# Patient Record
Sex: Female | Born: 1985 | Race: White | Hispanic: No | Smoking: Current every day smoker
Health system: Southern US, Community
[De-identification: ages and names within clinical notes are randomized; demographics above are authoritative.]

## PROBLEM LIST (undated history)

## (undated) ENCOUNTER — Inpatient Hospital Stay: Payer: Self-pay

## (undated) DIAGNOSIS — F419 Anxiety disorder, unspecified: Secondary | ICD-10-CM

## (undated) DIAGNOSIS — R109 Unspecified abdominal pain: Secondary | ICD-10-CM

## (undated) DIAGNOSIS — R3911 Hesitancy of micturition: Secondary | ICD-10-CM

## (undated) DIAGNOSIS — N200 Calculus of kidney: Secondary | ICD-10-CM

## (undated) DIAGNOSIS — N390 Urinary tract infection, site not specified: Secondary | ICD-10-CM

## (undated) DIAGNOSIS — M549 Dorsalgia, unspecified: Secondary | ICD-10-CM

## (undated) DIAGNOSIS — F410 Panic disorder [episodic paroxysmal anxiety] without agoraphobia: Secondary | ICD-10-CM

## (undated) DIAGNOSIS — K219 Gastro-esophageal reflux disease without esophagitis: Secondary | ICD-10-CM

## (undated) DIAGNOSIS — F319 Bipolar disorder, unspecified: Secondary | ICD-10-CM

## (undated) DIAGNOSIS — F4310 Post-traumatic stress disorder, unspecified: Secondary | ICD-10-CM

## (undated) DIAGNOSIS — J45909 Unspecified asthma, uncomplicated: Secondary | ICD-10-CM

## (undated) DIAGNOSIS — R310 Gross hematuria: Secondary | ICD-10-CM

## (undated) HISTORY — DX: Urinary tract infection, site not specified: N39.0

## (undated) HISTORY — DX: Gross hematuria: R31.0

## (undated) HISTORY — DX: Dorsalgia, unspecified: M54.9

## (undated) HISTORY — PX: LAPAROSCOPIC OVARIAN CYSTECTOMY: SUR786

## (undated) HISTORY — DX: Unspecified asthma, uncomplicated: J45.909

## (undated) HISTORY — DX: Gastro-esophageal reflux disease without esophagitis: K21.9

## (undated) HISTORY — PX: DILATION AND CURETTAGE OF UTERUS: SHX78

## (undated) HISTORY — DX: Hesitancy of micturition: R39.11

## (undated) HISTORY — DX: Bipolar disorder, unspecified: F31.9

## (undated) HISTORY — DX: Unspecified abdominal pain: R10.9

## (undated) HISTORY — DX: Anxiety disorder, unspecified: F41.9

## (undated) HISTORY — DX: Post-traumatic stress disorder, unspecified: F43.10

---

## 2005-01-17 ENCOUNTER — Emergency Department: Payer: Self-pay | Admitting: Emergency Medicine

## 2005-01-17 ENCOUNTER — Other Ambulatory Visit: Payer: Self-pay

## 2005-01-19 ENCOUNTER — Emergency Department: Payer: Self-pay | Admitting: Emergency Medicine

## 2005-01-20 ENCOUNTER — Emergency Department: Payer: Self-pay | Admitting: Emergency Medicine

## 2005-01-29 ENCOUNTER — Emergency Department: Payer: Self-pay | Admitting: General Practice

## 2005-07-07 ENCOUNTER — Observation Stay: Payer: Self-pay

## 2005-11-22 ENCOUNTER — Emergency Department: Payer: Self-pay | Admitting: Emergency Medicine

## 2005-12-11 ENCOUNTER — Emergency Department: Payer: Self-pay | Admitting: Emergency Medicine

## 2005-12-12 ENCOUNTER — Emergency Department: Payer: Self-pay | Admitting: Internal Medicine

## 2006-05-19 ENCOUNTER — Emergency Department: Payer: Self-pay | Admitting: Emergency Medicine

## 2006-05-19 ENCOUNTER — Other Ambulatory Visit: Payer: Self-pay

## 2006-06-22 ENCOUNTER — Emergency Department: Payer: Self-pay | Admitting: Emergency Medicine

## 2006-10-25 ENCOUNTER — Emergency Department: Payer: Self-pay | Admitting: Emergency Medicine

## 2006-10-26 ENCOUNTER — Emergency Department: Payer: Self-pay | Admitting: Unknown Physician Specialty

## 2007-01-03 ENCOUNTER — Emergency Department: Payer: Self-pay | Admitting: Emergency Medicine

## 2007-02-05 ENCOUNTER — Emergency Department: Payer: Self-pay | Admitting: Emergency Medicine

## 2007-02-15 ENCOUNTER — Emergency Department: Payer: Self-pay | Admitting: Internal Medicine

## 2007-04-14 ENCOUNTER — Emergency Department: Payer: Self-pay | Admitting: Emergency Medicine

## 2007-04-21 ENCOUNTER — Emergency Department: Payer: Self-pay | Admitting: Emergency Medicine

## 2007-05-16 ENCOUNTER — Other Ambulatory Visit: Payer: Self-pay

## 2007-05-16 ENCOUNTER — Emergency Department: Payer: Self-pay | Admitting: Emergency Medicine

## 2007-06-12 ENCOUNTER — Emergency Department: Payer: Self-pay | Admitting: Emergency Medicine

## 2007-06-20 ENCOUNTER — Emergency Department: Payer: Self-pay | Admitting: Emergency Medicine

## 2007-07-07 ENCOUNTER — Emergency Department: Payer: Self-pay | Admitting: Emergency Medicine

## 2007-08-06 ENCOUNTER — Emergency Department: Payer: Self-pay | Admitting: Emergency Medicine

## 2007-08-21 ENCOUNTER — Emergency Department: Payer: Self-pay | Admitting: Internal Medicine

## 2007-12-03 ENCOUNTER — Emergency Department: Payer: Self-pay | Admitting: Emergency Medicine

## 2007-12-17 ENCOUNTER — Emergency Department: Payer: Self-pay | Admitting: Emergency Medicine

## 2008-05-20 ENCOUNTER — Emergency Department: Payer: Self-pay | Admitting: Emergency Medicine

## 2008-10-07 ENCOUNTER — Emergency Department: Payer: Self-pay | Admitting: Emergency Medicine

## 2008-10-11 ENCOUNTER — Emergency Department: Payer: Self-pay | Admitting: Emergency Medicine

## 2008-12-19 ENCOUNTER — Ambulatory Visit: Payer: Self-pay | Admitting: Family Medicine

## 2009-02-23 ENCOUNTER — Emergency Department: Payer: Self-pay | Admitting: Emergency Medicine

## 2009-05-14 ENCOUNTER — Emergency Department: Payer: Self-pay | Admitting: Emergency Medicine

## 2009-09-14 ENCOUNTER — Emergency Department: Payer: Self-pay | Admitting: Emergency Medicine

## 2009-09-25 ENCOUNTER — Emergency Department: Payer: Self-pay | Admitting: Emergency Medicine

## 2009-10-28 ENCOUNTER — Emergency Department: Payer: Self-pay | Admitting: Internal Medicine

## 2009-12-14 ENCOUNTER — Emergency Department: Payer: Self-pay | Admitting: Emergency Medicine

## 2010-01-01 ENCOUNTER — Emergency Department: Payer: Self-pay | Admitting: Emergency Medicine

## 2010-01-31 ENCOUNTER — Emergency Department: Payer: Self-pay | Admitting: Internal Medicine

## 2010-04-25 ENCOUNTER — Emergency Department: Payer: Self-pay | Admitting: Emergency Medicine

## 2010-07-14 ENCOUNTER — Emergency Department: Payer: Self-pay | Admitting: Emergency Medicine

## 2010-09-19 ENCOUNTER — Emergency Department: Payer: Self-pay | Admitting: Emergency Medicine

## 2010-12-17 ENCOUNTER — Emergency Department: Payer: Self-pay | Admitting: Emergency Medicine

## 2011-04-04 ENCOUNTER — Emergency Department: Payer: Self-pay | Admitting: Emergency Medicine

## 2011-04-06 ENCOUNTER — Emergency Department: Payer: Self-pay | Admitting: Unknown Physician Specialty

## 2011-07-22 ENCOUNTER — Emergency Department: Payer: Self-pay | Admitting: Emergency Medicine

## 2011-11-20 ENCOUNTER — Emergency Department: Payer: Self-pay | Admitting: Emergency Medicine

## 2011-11-20 LAB — URINALYSIS, COMPLETE
Bilirubin,UR: NEGATIVE
Glucose,UR: NEGATIVE mg/dL (ref 0–75)
Hyaline Cast: 3
Ketone: NEGATIVE
Leukocyte Esterase: NEGATIVE
Nitrite: NEGATIVE
Ph: 5 (ref 4.5–8.0)
Protein: 30
RBC,UR: 3 /HPF (ref 0–5)
WBC UR: 5 /HPF (ref 0–5)

## 2011-12-17 ENCOUNTER — Emergency Department: Payer: Self-pay | Admitting: Emergency Medicine

## 2011-12-17 LAB — URINALYSIS, COMPLETE
Bilirubin,UR: NEGATIVE
Ketone: NEGATIVE
Leukocyte Esterase: NEGATIVE
Nitrite: NEGATIVE
Ph: 5 (ref 4.5–8.0)
Specific Gravity: 1.012 (ref 1.003–1.030)
Squamous Epithelial: 9
WBC UR: 1 /HPF (ref 0–5)

## 2011-12-19 LAB — URINE CULTURE

## 2012-01-21 ENCOUNTER — Emergency Department: Payer: Self-pay | Admitting: Emergency Medicine

## 2012-01-21 LAB — PREGNANCY, URINE: Pregnancy Test, Urine: NEGATIVE m[IU]/mL

## 2012-01-21 LAB — URINALYSIS, COMPLETE
Bacteria: NONE SEEN
Bilirubin,UR: NEGATIVE
Glucose,UR: NEGATIVE mg/dL (ref 0–75)
Leukocyte Esterase: NEGATIVE
Nitrite: NEGATIVE
Ph: 5 (ref 4.5–8.0)
Protein: 100
RBC,UR: 3114 /HPF (ref 0–5)
Squamous Epithelial: 57
WBC UR: NONE SEEN /HPF (ref 0–5)

## 2012-01-22 LAB — URINE CULTURE

## 2012-02-09 ENCOUNTER — Emergency Department: Payer: Self-pay | Admitting: Emergency Medicine

## 2012-02-09 LAB — CBC
HCT: 43.6 % (ref 35.0–47.0)
HGB: 14.2 g/dL (ref 12.0–16.0)
MCH: 30.4 pg (ref 26.0–34.0)
MCV: 93 fL (ref 80–100)
RDW: 13.4 % (ref 11.5–14.5)

## 2012-02-09 LAB — BASIC METABOLIC PANEL
Calcium, Total: 8.7 mg/dL (ref 8.5–10.1)
Chloride: 109 mmol/L — ABNORMAL HIGH (ref 98–107)
Creatinine: 0.79 mg/dL (ref 0.60–1.30)
EGFR (African American): >60
Glucose: 93 mg/dL (ref 65–99)
Sodium: 142 mmol/L (ref 136–145)

## 2012-02-09 LAB — PREGNANCY, URINE: Pregnancy Test, Urine: NEGATIVE m[IU]/mL

## 2012-02-09 LAB — WET PREP, GENITAL

## 2012-02-15 ENCOUNTER — Emergency Department: Payer: Self-pay | Admitting: Emergency Medicine

## 2012-02-15 LAB — URINALYSIS, COMPLETE
Bilirubin,UR: NEGATIVE
Blood: NEGATIVE
Glucose,UR: 50 mg/dL (ref 0–75)
Ketone: NEGATIVE
Leukocyte Esterase: NEGATIVE
Nitrite: POSITIVE
Ph: 5 (ref 4.5–8.0)
Protein: NEGATIVE
RBC,UR: 1 /HPF (ref 0–5)
Specific Gravity: 1.015 (ref 1.003–1.030)
Squamous Epithelial: 5
WBC UR: 2 /HPF (ref 0–5)

## 2012-04-18 ENCOUNTER — Emergency Department: Payer: Self-pay | Admitting: Emergency Medicine

## 2012-04-18 LAB — URINALYSIS, COMPLETE
Bacteria: NONE SEEN
Bilirubin,UR: NEGATIVE
Blood: NEGATIVE
Glucose,UR: NEGATIVE mg/dL (ref 0–75)
Leukocyte Esterase: NEGATIVE
Nitrite: NEGATIVE
Ph: 7 (ref 4.5–8.0)
Protein: NEGATIVE
RBC,UR: 1 /HPF (ref 0–5)

## 2012-04-18 LAB — DRUG SCREEN, URINE
Amphetamines, Ur Screen: NEGATIVE (ref ?–1000)
Barbiturates, Ur Screen: NEGATIVE (ref ?–200)
Benzodiazepine, Ur Scrn: POSITIVE (ref ?–200)
Cannabinoid 50 Ng, Ur ~~LOC~~: POSITIVE (ref ?–50)
Methadone, Ur Screen: NEGATIVE (ref ?–300)
Tricyclic, Ur Screen: NEGATIVE (ref ?–1000)

## 2012-04-18 LAB — CBC
HGB: 14.3 g/dL (ref 12.0–16.0)
MCH: 32 pg (ref 26.0–34.0)
MCHC: 33.9 g/dL (ref 32.0–36.0)
Platelet: 198 10*3/uL (ref 150–440)
RDW: 13.5 % (ref 11.5–14.5)

## 2012-04-18 LAB — BASIC METABOLIC PANEL
Anion Gap: 4 — ABNORMAL LOW (ref 7–16)
BUN: 8 mg/dL (ref 7–18)
Co2: 28 mmol/L (ref 21–32)
Creatinine: 0.72 mg/dL (ref 0.60–1.30)
EGFR (African American): >60
EGFR (Non-African Amer.): >60
Glucose: 93 mg/dL (ref 65–99)

## 2012-04-19 LAB — PREGNANCY, URINE: Pregnancy Test, Urine: NEGATIVE m[IU]/mL

## 2012-04-25 ENCOUNTER — Emergency Department: Payer: Self-pay | Admitting: Emergency Medicine

## 2012-04-25 LAB — PREGNANCY, URINE: Pregnancy Test, Urine: NEGATIVE m[IU]/mL

## 2012-04-25 LAB — URINALYSIS, COMPLETE
Bilirubin,UR: NEGATIVE
Glucose,UR: NEGATIVE mg/dL (ref 0–75)
Ketone: NEGATIVE
Leukocyte Esterase: NEGATIVE
Nitrite: POSITIVE
Ph: 5 (ref 4.5–8.0)
Protein: NEGATIVE
RBC,UR: 48 /HPF (ref 0–5)
Specific Gravity: 1.013 (ref 1.003–1.030)
Squamous Epithelial: 3
WBC UR: 2 /HPF (ref 0–5)

## 2012-05-31 ENCOUNTER — Emergency Department: Payer: Self-pay | Admitting: Emergency Medicine

## 2012-05-31 LAB — URINALYSIS, COMPLETE
Bacteria: NONE SEEN
Bilirubin,UR: NEGATIVE
Ketone: NEGATIVE
Protein: NEGATIVE
RBC,UR: 165 /HPF (ref 0–5)
Specific Gravity: 1.014 (ref 1.003–1.030)
Squamous Epithelial: 3
WBC UR: 2 /HPF (ref 0–5)

## 2012-06-01 LAB — WET PREP, GENITAL

## 2012-09-25 ENCOUNTER — Emergency Department: Payer: Self-pay | Admitting: Emergency Medicine

## 2012-09-25 LAB — URINALYSIS, COMPLETE
Bilirubin,UR: NEGATIVE
Ketone: NEGATIVE
Nitrite: NEGATIVE
Ph: 8 (ref 4.5–8.0)
Protein: NEGATIVE
RBC,UR: 10 /HPF (ref 0–5)
Specific Gravity: 1.01 (ref 1.003–1.030)
WBC UR: 3 /HPF (ref 0–5)

## 2012-09-25 LAB — WET PREP, GENITAL

## 2012-09-27 LAB — URINE CULTURE

## 2012-10-24 ENCOUNTER — Emergency Department: Payer: Self-pay | Admitting: Emergency Medicine

## 2012-10-24 LAB — URINALYSIS, COMPLETE
Bacteria: NONE SEEN
Bilirubin,UR: NEGATIVE
Glucose,UR: NEGATIVE mg/dL (ref 0–75)
Ketone: NEGATIVE
Leukocyte Esterase: NEGATIVE
Ph: 6 (ref 4.5–8.0)
Protein: 30
RBC,UR: 4 /HPF (ref 0–5)
WBC UR: 2 /HPF (ref 0–5)

## 2012-10-24 LAB — CBC
HCT: 40.3 % (ref 35.0–47.0)
HGB: 13.5 g/dL (ref 12.0–16.0)
MCHC: 33.5 g/dL (ref 32.0–36.0)
Platelet: 202 10*3/uL (ref 150–440)
RBC: 4.41 10*6/uL (ref 3.80–5.20)
RDW: 13.6 % (ref 11.5–14.5)

## 2012-10-25 LAB — WET PREP, GENITAL

## 2012-12-01 ENCOUNTER — Ambulatory Visit: Payer: Self-pay | Admitting: Obstetrics and Gynecology

## 2012-12-01 LAB — CBC
HGB: 13.4 g/dL (ref 12.0–16.0)
MCH: 31 pg (ref 26.0–34.0)
MCHC: 34 g/dL (ref 32.0–36.0)
MCV: 91 fL (ref 80–100)
Platelet: 255 10*3/uL (ref 150–440)
RBC: 4.32 10*6/uL (ref 3.80–5.20)

## 2012-12-02 ENCOUNTER — Ambulatory Visit: Payer: Self-pay | Admitting: Obstetrics and Gynecology

## 2012-12-05 LAB — PATHOLOGY REPORT

## 2013-04-12 ENCOUNTER — Ambulatory Visit: Payer: Self-pay | Admitting: Urology

## 2013-04-13 ENCOUNTER — Ambulatory Visit: Payer: Self-pay | Admitting: Urology

## 2013-04-19 ENCOUNTER — Ambulatory Visit: Payer: Self-pay | Admitting: Urology

## 2013-06-11 ENCOUNTER — Emergency Department: Payer: Self-pay | Admitting: Emergency Medicine

## 2013-06-11 LAB — URINALYSIS, COMPLETE
Bacteria: NONE SEEN
Bilirubin,UR: NEGATIVE
Glucose,UR: NEGATIVE mg/dL (ref 0–75)
Ketone: NEGATIVE
Protein: NEGATIVE
RBC,UR: 3 /HPF (ref 0–5)
WBC UR: 1 /HPF (ref 0–5)

## 2013-06-11 LAB — COMPREHENSIVE METABOLIC PANEL
Albumin: 3.8 g/dL (ref 3.4–5.0)
Anion Gap: 3 — ABNORMAL LOW (ref 7–16)
Bilirubin,Total: 0.4 mg/dL (ref 0.2–1.0)
Calcium, Total: 8.6 mg/dL (ref 8.5–10.1)
Co2: 25 mmol/L (ref 21–32)
Creatinine: 0.79 mg/dL (ref 0.60–1.30)
EGFR (African American): >60
Glucose: 89 mg/dL (ref 65–99)
Osmolality: 276 (ref 275–301)
Potassium: 4 mmol/L (ref 3.5–5.1)
SGOT(AST): 22 U/L (ref 15–37)
SGPT (ALT): 16 U/L (ref 12–78)
Sodium: 138 mmol/L (ref 136–145)
Total Protein: 6.8 g/dL (ref 6.4–8.2)

## 2013-06-11 LAB — CBC
HCT: 42.2 % (ref 35.0–47.0)
HGB: 14.5 g/dL (ref 12.0–16.0)
MCHC: 34.4 g/dL (ref 32.0–36.0)
MCV: 91 fL (ref 80–100)
Platelet: 158 10*3/uL (ref 150–440)
RBC: 4.64 10*6/uL (ref 3.80–5.20)
WBC: 6.3 10*3/uL (ref 3.6–11.0)

## 2013-06-11 LAB — LIPASE, BLOOD: Lipase: 82 U/L (ref 73–393)

## 2013-06-11 LAB — WET PREP, GENITAL

## 2013-07-29 ENCOUNTER — Emergency Department: Payer: Self-pay | Admitting: Emergency Medicine

## 2013-07-29 LAB — URINALYSIS, COMPLETE
Bacteria: NONE SEEN
Bilirubin,UR: NEGATIVE
Blood: NEGATIVE
Leukocyte Esterase: NEGATIVE
Nitrite: NEGATIVE
Protein: NEGATIVE
RBC,UR: <1 /HPF (ref 0–5)
Specific Gravity: 1.003 (ref 1.003–1.030)
Squamous Epithelial: 3
WBC UR: 2 /HPF (ref 0–5)

## 2013-07-29 LAB — CBC
HCT: 38.6 % (ref 35.0–47.0)
HGB: 12.8 g/dL (ref 12.0–16.0)
MCH: 29.7 pg (ref 26.0–34.0)
MCHC: 33.1 g/dL (ref 32.0–36.0)
MCV: 90 fL (ref 80–100)
Platelet: 193 10*3/uL (ref 150–440)
RDW: 13.4 % (ref 11.5–14.5)
WBC: 7.5 10*3/uL (ref 3.6–11.0)

## 2013-07-29 LAB — BASIC METABOLIC PANEL
BUN: 9 mg/dL (ref 7–18)
Calcium, Total: 8.7 mg/dL (ref 8.5–10.1)
Chloride: 108 mmol/L — ABNORMAL HIGH (ref 98–107)
Co2: 28 mmol/L (ref 21–32)
EGFR (African American): >60
Glucose: 105 mg/dL — ABNORMAL HIGH (ref 65–99)
Osmolality: 282 (ref 275–301)

## 2013-07-29 LAB — PREGNANCY, URINE: Pregnancy Test, Urine: NEGATIVE m[IU]/mL

## 2014-07-09 ENCOUNTER — Emergency Department: Payer: Self-pay | Admitting: Emergency Medicine

## 2014-12-14 NOTE — Op Note (Signed)
PATIENT NAME:  Jennifer Richmond, Jennifer Richmond MR#:  [id number] DATE OF BIRTH:  1986-07-06  DATE OF PROCEDURE:  12/02/2012  PREOPERATIVE DIAGNOSES: 1.  Incomplete abortion.  2.  A positive blood type.   POSTOPERATIVE DIAGNOSES:   1.  Incomplete abortion.  2.  A positive blood type.   OPERATIVE PROCEDURE:  Suction D and C.   SURGEON:  Christin Mccreedy A. Katelen Luepke, M.D.   FIRST ASSISTANT:  Sunday Corn, PA-S.   ANESTHESIA:  General LMA.   INDICATIONS:  The patient is a 29 year old white female, gravida 4, para 1-0-2-1, in first trimester who presented for management of incomplete abortion. She is status post Cytotec x 2 without complete evacuation of the uterus. The patient is still having bleeding and cramping and desires surgical evacuation.   FINDINGS AT SURGERY:  Revealed a gynecoid pelvis; the uterus was anteverted and top of normal size. Uterine descensus was notable with the cervix coming to within 2 cm of the introitus. The uterus sounded to 8 cm.  Average amount of POC was obtained.   DESCRIPTION OF PROCEDURE:  The patient was brought to the operating room where she was placed in the supine position. General anesthesia with LMA was induced without difficulty. She was placed in dorsal lithotomy position using the candy cane stirrups. A Betadine perineal  intravaginal prep and drape was performed in the standard fashion. A red Robinson catheter was used to drain 25 mL of urine from the bladder. A weighted speculum was placed to the vagina  and a single-tooth tenaculum on the anterior lip of the cervix. The uterus was sounded to 8 cm.  Hanks dilators were used up to a #20 [place] caliber to dilate the endocervical canal. A #10 suction curette was then used with suction curettage to remove the products of conception. A serrated curette was used to verify no significant residual tissue was left behind. Upon completion of the procedure, all instrumentation was removed from the vagina. The patient was then awakened,  mobilized and taken to the recovery room in satisfactory condition.  Estimated blood loss was minimal. Complications were none.  All instruments, needle and sponge counts were verified as correct.     ____________________________ Alanda Slim. Luay Balding, MD mad:dmm D: 12/02/2012 08:51:00 ET T: 12/02/2012 11:09:55 ET JOB#: [id number]  cc: Hassell Done A. Sireen Halk, MD, <Dictator> [facility] MD ELECTRONICALLY SIGNED 12/05/2012 16:36

## 2015-03-06 ENCOUNTER — Emergency Department
Admission: EM | Admit: 2015-03-06 | Discharge: 2015-03-06 | Disposition: A | Discharge status: Home / Self Care | Payer: Medicaid Other | Attending: Emergency Medicine | Admitting: Emergency Medicine

## 2015-03-06 ENCOUNTER — Emergency Department: Payer: Medicaid Other

## 2015-03-06 ENCOUNTER — Encounter: Payer: Self-pay | Admitting: Emergency Medicine

## 2015-03-06 DIAGNOSIS — W228XXA Striking against or struck by other objects, initial encounter: Secondary | ICD-10-CM | POA: Diagnosis not present

## 2015-03-06 DIAGNOSIS — S9031XA Contusion of right foot, initial encounter: Secondary | ICD-10-CM | POA: Insufficient documentation

## 2015-03-06 DIAGNOSIS — Z720 Tobacco use: Secondary | ICD-10-CM | POA: Diagnosis not present

## 2015-03-06 DIAGNOSIS — Y998 Other external cause status: Secondary | ICD-10-CM | POA: Diagnosis not present

## 2015-03-06 DIAGNOSIS — Y9289 Other specified places as the place of occurrence of the external cause: Secondary | ICD-10-CM | POA: Insufficient documentation

## 2015-03-06 DIAGNOSIS — Y9389 Activity, other specified: Secondary | ICD-10-CM | POA: Diagnosis not present

## 2015-03-06 DIAGNOSIS — Z79899 Other long term (current) drug therapy: Secondary | ICD-10-CM | POA: Insufficient documentation

## 2015-03-06 DIAGNOSIS — S9032XA Contusion of left foot, initial encounter: Secondary | ICD-10-CM | POA: Insufficient documentation

## 2015-03-06 DIAGNOSIS — S99921A Unspecified injury of right foot, initial encounter: Secondary | ICD-10-CM | POA: Diagnosis present

## 2015-03-06 DIAGNOSIS — S99929A Unspecified injury of unspecified foot, initial encounter: Secondary | ICD-10-CM

## 2015-03-06 HISTORY — DX: Calculus of kidney: N20.0

## 2015-03-06 HISTORY — DX: Panic disorder (episodic paroxysmal anxiety): F41.0

## 2015-03-06 MED ORDER — KETOROLAC TROMETHAMINE 60 MG/2ML IM SOLN
60.0000 mg | Freq: Once | INTRAMUSCULAR | Status: AC
  Administered 2015-03-06: 60 mg via INTRAMUSCULAR
  Filled 2015-03-06: qty 2

## 2015-03-06 NOTE — ED Notes (Signed)
Pt to ED c/o bilateral foot injury.  Pt states tripped over toolbox tonight going to restroom.  Abrasions noted to medial side left foot.  Pt also c/o pain to medial side right foot from fall previously down stairs.  Skin warm and dry, pedal pulses strong and equal bilaterally, no obvious deformity noted, pt with limited movement to toes.  Pt states took IBU when injury happened and half of a tramadol an hour later.

## 2015-03-06 NOTE — ED Provider Notes (Signed)
[facility] Emergency Department Provider Note  ____________________________________________  Time seen: 3:00  I have reviewed the triage vital signs and the nursing notes.   HISTORY  Chief Complaint Foot Pain      HPI Jennifer Richmond is a 29 y.o. female presents with history of striking her left foot against a toolbox this evening. Patient admits to 7 out of 10 medial left foot pain. In addition patient admits to injuring her right foot approximately 3 weeks early in the same area.     Past Medical History  Diagnosis Date  . Kidney stones   . Panic attacks     There are no active problems to display for this patient.   History reviewed. No pertinent past surgical history.  Current Outpatient Rx  Name  Route  Sig  Dispense  Refill  . clonazePAM (KLONOPIN) 1 MG tablet   Oral   Take 1 mg by mouth 2 (two) times daily.         . tamsulosin (FLOMAX) 0.4 MG CAPS capsule   Oral   Take 0.4 mg by mouth.         . traMADol (ULTRAM) 50 MG tablet   Oral   Take by mouth every 6 (six) hours as needed.           Allergies Review of patient's allergies indicates no known allergies.  History reviewed. No pertinent family history.  Social History History  Substance Use Topics  . Smoking status: Current Every Day Smoker  . Smokeless tobacco: Not on file  . Alcohol Use: Yes    Review of Systems  Constitutional: Negative for fever. Eyes: Negative for visual changes. ENT: Negative for sore throat. Cardiovascular: Negative for chest pain. Respiratory: Negative for shortness of breath. Gastrointestinal: Negative for abdominal pain, vomiting and diarrhea. Genitourinary: Negative for dysuria. Musculoskeletal: Bilateral feet pain Skin: Negative for rash. Neurological: Negative for headaches, focal weakness or numbness.   10-point ROS otherwise negative.  ____________________________________________   PHYSICAL EXAM:  VITAL SIGNS: ED  Triage Vitals  Enc Vitals Group     BP 03/06/15 0310 101/58 mmHg     Pulse Rate 03/06/15 0310 71     Resp 03/06/15 0310 20     Temp 03/06/15 0310 98.4 F (36.9 C)     Temp src --      SpO2 03/06/15 0310 100 %     Weight 03/06/15 0307 100 lb (45.36 kg)     Height 03/06/15 0307 5' (1.524 m)     Head Cir --      Peak Flow --      Pain Score 03/06/15 0307 8     Pain Loc --      Pain Edu? --      Excl. in [place]? --      Constitutional: Alert and oriented. Well appearing and in no distress. Eyes: Conjunctivae are normal. PERRL. Normal extraocular movements. ENT   Head: Normocephalic and atraumatic.   Nose: No congestion/rhinnorhea.   Mouth/Throat: Mucous membranes are moist.   Neck: No stridor.  Cardiovascular: Normal rate, regular rhythm. Normal and symmetric distal pulses are present in all extremities. No murmurs, rubs, or gallops. Respiratory: Normal respiratory effort without tachypnea nor retractions. Breath sounds are clear and equal bilaterally. No wheezes/rales/rhonchi. Gastrointestinal: Soft and nontender. No distention. There is no CVA tenderness. Genitourinary: deferred Musculoskeletal: Nontender with normal range of motion in all extremities. Pain with palpation of the medial aspect of the left foot. Pain  to palpation at the right1st PIP joint of the right foot Neurologic:  Normal speech and language. No gross focal neurologic deficits are appreciated. Speech is normal.  Skin:  2 small abrasions noted to the medial aspect of the left foot  ____________________________________________    RADIOLOGY  Right foot x-ray revealed: Negative for fracture Left foot x-ray revealed: Negative for fracture  ____________________________________________     INITIAL IMPRESSION / ASSESSMENT AND PLAN / ED COURSE  Pertinent labs & imaging results that were available during my care of the patient were reviewed by me and considered in my medical decision making (see chart  for details).   ____________________________________________   FINAL CLINICAL IMPRESSION(S) / ED DIAGNOSES  Final diagnoses:  Foot contusion, left, initial encounter  Foot contusion, right, initial encounter      Gregor Hams, MD 03/08/15 [phone]

## 2015-03-06 NOTE — Discharge Instructions (Signed)
Contusion °A contusion is a deep bruise. Contusions are the result of an injury that caused bleeding under the skin. The contusion may turn blue, purple, or yellow. Minor injuries will give you a painless contusion, but more severe contusions may stay painful and swollen for a few weeks.  °CAUSES  °A contusion is usually caused by a blow, trauma, or direct force to an area of the body. °SYMPTOMS  °· Swelling and redness of the injured area. °· Bruising of the injured area. °· Tenderness and soreness of the injured area. °· Pain. °DIAGNOSIS  °The diagnosis can be made by taking a history and physical exam. An X-ray, CT scan, or MRI may be needed to determine if there were any associated injuries, such as fractures. °TREATMENT  °Specific treatment will depend on what area of the body was injured. In general, the best treatment for a contusion is resting, icing, elevating, and applying cold compresses to the injured area. Over-the-counter medicines may also be recommended for pain control. Ask your caregiver what the best treatment is for your contusion. °HOME CARE INSTRUCTIONS  °· Put ice on the injured area. °¨ Put ice in a plastic bag. °¨ Place a towel between your skin and the bag. °¨ Leave the ice on for 15-20 minutes, 3-4 times a day, or as directed by your health care provider. °· Only take over-the-counter or prescription medicines for pain, discomfort, or fever as directed by your caregiver. Your caregiver may recommend avoiding anti-inflammatory medicines (aspirin, ibuprofen, and naproxen) for 48 hours because these medicines may increase bruising. °· Rest the injured area. °· If possible, elevate the injured area to reduce swelling. °SEEK IMMEDIATE MEDICAL CARE IF:  °· You have increased bruising or swelling. °· You have pain that is getting worse. °· Your swelling or pain is not relieved with medicines. °MAKE SURE YOU:  °· Understand these instructions. °· Will watch your condition. °· Will get help right  away if you are not doing well or get worse. °Document Released: 05/20/2005 Document Revised: 08/15/2013 Document Reviewed: 06/15/2011 °ExitCare® Patient Information ©2015 [employer]. This information is not intended to replace advice given to you by your health care provider. Make sure you discuss any questions you have with your health care provider. ° °

## 2015-03-06 NOTE — ED Notes (Addendum)
Pt presents to ER alert and in NAD. Pt states right foot pain x 1 hour, reports injury 3 weeks ago. Pt also states pain in left foot after tripping today. Small abrasion noted, no swelling or bruising.

## 2015-03-08 ENCOUNTER — Ambulatory Visit: Payer: Self-pay | Admitting: Urology

## 2015-04-12 ENCOUNTER — Telehealth: Payer: Self-pay

## 2015-04-12 NOTE — Telephone Encounter (Signed)
Pharmacy is requesting a refill of macrobid. Pt last saw Dr. Elnoria Howard 02/2014. Dr. Elnoria Howard put on daily macrobid x90 with 3 refills. Please advise.

## 2015-04-14 NOTE — Telephone Encounter (Signed)
Patient will need an office visit prior to refilling her Macrobid.

## 2015-04-16 NOTE — Telephone Encounter (Signed)
No answer

## 2015-04-17 NOTE — Telephone Encounter (Signed)
No answer

## 2015-04-18 NOTE — Telephone Encounter (Signed)
Will send a letter

## 2015-05-03 ENCOUNTER — Encounter: Payer: Self-pay | Admitting: Obstetrics and Gynecology

## 2015-05-03 ENCOUNTER — Telehealth: Payer: Self-pay | Admitting: *Deleted

## 2015-05-03 ENCOUNTER — Ambulatory Visit (INDEPENDENT_AMBULATORY_CARE_PROVIDER_SITE_OTHER): Payer: Medicaid Other | Admitting: Obstetrics and Gynecology

## 2015-05-03 ENCOUNTER — Other Ambulatory Visit: Payer: Self-pay | Admitting: Obstetrics and Gynecology

## 2015-05-03 VITALS — BP 90/56 | HR 55 | Resp 14 | Ht 65.0 in | Wt 97.1 lb

## 2015-05-03 DIAGNOSIS — R109 Unspecified abdominal pain: Secondary | ICD-10-CM

## 2015-05-03 DIAGNOSIS — N390 Urinary tract infection, site not specified: Secondary | ICD-10-CM

## 2015-05-03 LAB — URINALYSIS, COMPLETE
BILIRUBIN UA: POSITIVE — AB
Nitrite, UA: POSITIVE — AB
Specific Gravity, UA: 1.02 (ref 1.005–1.030)
UUROB: 2 mg/dL — AB (ref 0.2–1.0)
pH, UA: 5 (ref 5.0–7.5)

## 2015-05-03 LAB — MICROSCOPIC EXAMINATION

## 2015-05-03 MED ORDER — NITROFURANTOIN MONOHYD MACRO 100 MG PO CAPS: 100.0000 mg | ORAL_CAPSULE | Freq: Every day | ORAL | Status: DC

## 2015-05-03 MED ORDER — CIPROFLOXACIN HCL 500 MG PO TABS: 500.0000 mg | ORAL_TABLET | Freq: Two times a day (BID) | ORAL | Status: DC

## 2015-05-03 MED ORDER — TAMSULOSIN HCL 0.4 MG PO CAPS: 0.4000 mg | ORAL_CAPSULE | Freq: Every day | ORAL | Status: DC

## 2015-05-03 NOTE — Telephone Encounter (Signed)
Jennifer Richmond spoke with pt and gave culture information.

## 2015-05-03 NOTE — Addendum Note (Signed)
Addended by: Herbert Moors C on: 05/03/2015 02:00 PM   Modules accepted: Orders

## 2015-05-03 NOTE — Telephone Encounter (Signed)
LMOM Please let patient know that Ria Comment received her UA from today's visit and will be ordering a culture. That it may be resistant to the Macrobid she is already on and Ria Comment will be calling a new rx into pharmacy. Patient will receive a call when culture comes back.

## 2015-05-03 NOTE — Progress Notes (Addendum)
05/03/2015 12:11 PM   Jennifer Richmond 01/14/1986 [id number]  Referring provider: Threasa Alpha, FNP  Chief Complaint  Patient presents with  . Vaginal Itching    refill Macrobid    HPI: Patient is a 29 year old Caucasian with a history of kidney stones, recurrent urinary tract infections, and recurrent small yeast infections. She has done well on suppressive antibiotics taking Macrobid daily. Approximately 1 year ago she was noted to have multiple right sided nonobstructing kidney stones. She was told at that time that she would most likely be able to pass the stones without intervention and has been taking daily Flomax since. She also reports that she did experience difficulty urinating and the Flomax helps with this well. She has previously underwent cystoscopy and was told at that time her bladder was normal but that she had inflammation of her urethra. She is currently being treated by her PCP for bacterial vaginosis with Flagyl. She states that medication has made her extremely nauseated. She has also recently experiencing some right-sided flank pain. She is also taking probiotics and has used boric acid suppositories in the past to help with her recurrent vaginal yeast infections. She states that she has done well since she started taking daily Macrobid and has only had approximately 1-2 urinary infections in the last year.  PMH: Past Medical History  Diagnosis Date  . Kidney stones   . Panic attacks   . Abdominal pain   . Anxiety   . Back pain   . GERD (gastroesophageal reflux disease)   . Bipolar disorder   . Asthma   . PTSD (post-traumatic stress disorder)   . UTI (urinary tract infection)   . Urinary hesitancy   . Gross hematuria     Surgical History: Past Surgical History  Procedure Laterality Date  . Dilation and curettage of uterus    . Ovarian resection      Home Medications:    Medication List       This list is accurate as of: 05/03/15 12:11 PM.   Always use your most recent med list.               clonazePAM 1 MG tablet  Commonly known as:  KLONOPIN  Take 1 mg by mouth 2 (two) times daily.     metroNIDAZOLE 500 MG tablet  Commonly known as:  FLAGYL  Take 500 mg by mouth 3 (three) times daily.     nitrofurantoin (macrocrystal-monohydrate) 100 MG capsule  Commonly known as:  MACROBID  Take 100 mg by mouth 2 (two) times daily.     tamsulosin 0.4 MG Caps capsule  Commonly known as:  FLOMAX  Take 0.4 mg by mouth.     traMADol 50 MG tablet  Commonly known as:  ULTRAM  Take by mouth every 6 (six) hours as needed.     TRAZODONE & DIET MANAGE PROD PO  Take by mouth.        Allergies: No Known Allergies  Family History: Family History  Problem Relation Age of Onset  . Diabetes Father   . Kidney nephrosis Father   . Seizures Mother     Social History:  reports that she has been smoking.  She does not have any smokeless tobacco history on file. She reports that she drinks alcohol. Her drug history is not on file.  ROS: UROLOGY Frequent Urination?: Yes Hard to postpone urination?: No Burning/pain with urination?: Yes Get up at night to urinate?: Yes Leakage of urine?: No  Urine stream starts and stops?: No Trouble starting stream?: No Do you have to strain to urinate?: No Blood in urine?: Yes Urinary tract infection?: No Sexually transmitted disease?: No Injury to kidneys or bladder?: No Painful intercourse?: Yes Weak stream?: Yes Currently pregnant?: No Vaginal bleeding?: No Last menstrual period?: 04/30/2015  Gastrointestinal Nausea?: Yes Vomiting?: No Indigestion/heartburn?: No Diarrhea?: No Constipation?: No  Constitutional Fever: No Night sweats?: No Weight loss?: No Fatigue?: No  Skin Skin rash/lesions?: No Itching?: No  Eyes Blurred vision?: No Double vision?: No  Ears/Nose/Throat Sore throat?: No Sinus problems?: No  Hematologic/Lymphatic Swollen glands?: No Easy bruising?:  No  Cardiovascular Leg swelling?: No Chest pain?: No  Respiratory Cough?: No Shortness of breath?: No  Endocrine Excessive thirst?: No  Musculoskeletal Back pain?: No Joint pain?: No  Neurological Headaches?: No Dizziness?: No  Psychologic Depression?: No Anxiety?: No  Physical Exam: BP 90/56 mmHg  Pulse 55  Resp 14  Ht 5\' 5"  (1.651 m)  Wt 97 lb 1.6 oz (44.044 kg)  BMI 16.16 kg/m2  Constitutional:  Alert and oriented, No acute distress. HEENT: [place] AT, moist mucus membranes.  Trachea midline, no masses. Cardiovascular: No clubbing, cyanosis, or edema. Respiratory: Normal respiratory effort, no increased work of breathing. GI: Abdomen is soft, nontender, nondistended, no abdominal masses GU: No CVA tenderness.  Skin: No rashes, bruises or suspicious lesions. Lymph: No cervical or inguinal adenopathy. Neurologic: Grossly intact, no focal deficits, moving all 4 extremities. Psychiatric: Normal mood and affect.  Laboratory Data: Lab Results  Component Value Date   WBC 7.5 07/29/2013   HGB 12.8 07/29/2013   HCT 38.6 07/29/2013   MCV 90 07/29/2013   PLT 193 07/29/2013    Lab Results  Component Value Date   CREATININE 0.69 07/29/2013    No results found for: PSA  No results found for: TESTOSTERONE  No results found for: HGBA1C  Urinalysis    Component Value Date/Time   COLORURINE Straw 07/29/2013 1519   APPEARANCEUR Clear 07/29/2013 1519   LABSPEC 1.003 07/29/2013 1519   PHURINE 6.0 07/29/2013 1519   GLUCOSEU Negative 07/29/2013 1519   HGBUR Negative 07/29/2013 1519   BILIRUBINUR Negative 07/29/2013 1519   KETONESUR Negative 07/29/2013 1519   PROTEINUR Negative 07/29/2013 1519   NITRITE Negative 07/29/2013 1519   LEUKOCYTESUR Negative 07/29/2013 1519    Pertinent Imaging:  Assessment & Plan:    1. Recurrent UTI: UA suspicious for infection.  Will start Cipro.  Culture pending. Resume suppressive Macrobid. Patient has done well on daily  Macrobid and reports only 1-2 urinary tract infections per year since. Patient may benefit from removal of small renal stones which could be the source of her recurrent urinary tract infections.  - Urinalysis, Complete  2. Vaginitis-  Continue Flagyl as prescribed primary care provider. I recommended patient break tablet in half and divide medication to 4 doses and to take with food to help with nausea.  3. Kidney Stones- Continue Flomax. Patient has experienced some mild right-sided flank pain recently. I will order a KUB and a renal ultrasound today to evaluate current stone burden.  Problem List Items Addressed This Visit    None    Visit Diagnoses    Recurrent UTI    -  Primary    Relevant Medications    metroNIDAZOLE (FLAGYL) 500 MG tablet    nitrofurantoin, macrocrystal-monohydrate, (MACROBID) 100 MG capsule    Other Relevant Orders    Urinalysis, Complete    Flank pain  Relevant Medications    tamsulosin (FLOMAX) 0.4 MG CAPS capsule    Other Relevant Orders    Abdomen 1 view (KUB)    Ultrasound renal complete    Right flank pain        Relevant Orders    Abdomen 1 view (KUB)    Ultrasound renal complete      Return for f/u for KUB/RUS results.  Herbert Moors, [facility] [address] [place], [place] [address] [phone]

## 2015-05-06 ENCOUNTER — Telehealth: Payer: Self-pay

## 2015-05-06 LAB — CULTURE, URINE COMPREHENSIVE

## 2015-05-06 NOTE — Telephone Encounter (Signed)
-----   Message from Roda Shutters, [place] sent at 05/06/2015  3:25 PM EDT ----- Please notify patient that her urine culture was negative for infection

## 2015-05-06 NOTE — Telephone Encounter (Signed)
Spoke with pt in reference to -Ucx. Pt voiced understanding.  

## 2015-05-09 ENCOUNTER — Other Ambulatory Visit: Payer: Self-pay

## 2015-05-09 ENCOUNTER — Telehealth: Payer: Self-pay

## 2015-05-09 DIAGNOSIS — R109 Unspecified abdominal pain: Secondary | ICD-10-CM

## 2015-05-09 NOTE — Telephone Encounter (Signed)
Pt called inquiring about medications that were given. Per Lindsay's note nurse reinforced with pt not to take cipro and to take microbid and flomax daily. Pt voiced understanding.

## 2015-05-20 ENCOUNTER — Ambulatory Visit
Admission: RE | Admit: 2015-05-20 | Discharge: 2015-05-20 | Disposition: A | Discharge status: Home / Self Care | Payer: Medicaid Other | Source: Ambulatory Visit | Attending: Obstetrics and Gynecology | Admitting: Obstetrics and Gynecology

## 2015-05-20 ENCOUNTER — Ambulatory Visit (INDEPENDENT_AMBULATORY_CARE_PROVIDER_SITE_OTHER): Payer: Medicaid Other | Admitting: Obstetrics and Gynecology

## 2015-05-20 ENCOUNTER — Encounter: Payer: Self-pay | Admitting: Obstetrics and Gynecology

## 2015-05-20 VITALS — BP 103/66 | HR 67 | Resp 16 | Ht 65.5 in | Wt 97.3 lb

## 2015-05-20 DIAGNOSIS — R109 Unspecified abdominal pain: Secondary | ICD-10-CM | POA: Insufficient documentation

## 2015-05-20 DIAGNOSIS — N390 Urinary tract infection, site not specified: Secondary | ICD-10-CM

## 2015-05-20 LAB — URINALYSIS, COMPLETE
Bilirubin, UA: POSITIVE — AB
Glucose, UA: NEGATIVE
Ketones, UA: NEGATIVE
LEUKOCYTES UA: NEGATIVE
Nitrite, UA: NEGATIVE
PH UA: 5 (ref 5.0–7.5)
Protein, UA: NEGATIVE
Specific Gravity, UA: >1.03 — ABNORMAL HIGH (ref 1.005–1.030)
Urobilinogen, Ur: 0.2 mg/dL (ref 0.2–1.0)

## 2015-05-20 LAB — MICROSCOPIC EXAMINATION

## 2015-05-20 NOTE — Progress Notes (Signed)
05/20/2015 1:37 PM   Jennifer Richmond November 06, 1985 [id number]  Referring provider: Kathrine Haddock, NP [place], [place] [address]  Chief Complaint  Patient presents with  . Results    KUB & RUS  . Recurrent UTI    HPI: Patient presents today to discuss KUB results. She has not yet gone to have her renal ultrasound done. She reports right flank pain has somewhat improved. Her urine culture was negative at her last visit. She has continued to take her suppressive Macrobid as well as Flomax.  Previous Complaint History: Patient is a 29 year old Caucasian with a history of kidney stones, recurrent urinary tract infections, and recurrent small yeast infections. She has done well on suppressive antibiotics taking Macrobid daily. Approximately 1 year ago she was noted to have multiple right sided nonobstructing kidney stones. She was told at that time that she would most likely be able to pass the stones without intervention and has been taking daily Flomax since. She also reports that she did experience difficulty urinating and the Flomax helps with this well. She has previously underwent cystoscopy and was told at that time her bladder was normal but that she had inflammation of her urethra. She is currently being treated by her PCP for bacterial vaginosis with Flagyl. She states that medication has made her extremely nauseated. She has also recently experiencing some right-sided flank pain. She is also taking probiotics and has used boric acid suppositories in the past to help with her recurrent vaginal yeast infections. She states that she has done well since she started taking daily Macrobid and has only had approximately 1-2 urinary infections in the last year.   PMH: Past Medical History  Diagnosis Date  . Kidney stones   . Panic attacks   . Abdominal pain   . Anxiety   . Back pain   . GERD (gastroesophageal reflux disease)   . Bipolar disorder   . Asthma   . PTSD (post-traumatic  stress disorder)   . UTI (urinary tract infection)   . Urinary hesitancy   . Gross hematuria     Surgical History: Past Surgical History  Procedure Laterality Date  . Dilation and curettage of uterus    . Ovarian resection      Home Medications:    Medication List       This list is accurate as of: 05/20/15  1:37 PM.  Always use your most recent med list.               clonazePAM 1 MG tablet  Commonly known as:  KLONOPIN  Take 1 mg by mouth 2 (two) times daily.     metroNIDAZOLE 500 MG tablet  Commonly known as:  FLAGYL  Take 500 mg by mouth 3 (three) times daily.     nitrofurantoin (macrocrystal-monohydrate) 100 MG capsule  Commonly known as:  MACROBID  Take 1 capsule (100 mg total) by mouth daily.     tamsulosin 0.4 MG Caps capsule  Commonly known as:  FLOMAX  Take 1 capsule (0.4 mg total) by mouth daily.     traMADol 50 MG tablet  Commonly known as:  ULTRAM  Take by mouth every 6 (six) hours as needed.     TRAZODONE & DIET MANAGE PROD PO  Take by mouth.        Allergies: No Known Allergies  Family History: Family History  Problem Relation Age of Onset  . Diabetes Father   . Kidney nephrosis Father   .  Seizures Mother     Social History:  reports that she has been smoking.  She does not have any smokeless tobacco history on file. She reports that she drinks alcohol. Her drug history is not on file.  ROS: UROLOGY Frequent Urination?: Yes Hard to postpone urination?: No Burning/pain with urination?: Yes Get up at night to urinate?: Yes Leakage of urine?: No Urine stream starts and stops?: No Trouble starting stream?: Yes Do you have to strain to urinate?: No Blood in urine?: No Urinary tract infection?: No Sexually transmitted disease?: No Injury to kidneys or bladder?: No Painful intercourse?: No Weak stream?: No Currently pregnant?: No Vaginal bleeding?: No Last menstrual period?: n  Gastrointestinal Nausea?: Yes Vomiting?:  No Indigestion/heartburn?: No Diarrhea?: No Constipation?: No  Constitutional Fever: No Night sweats?: No Weight loss?: No Fatigue?: No  Skin Skin rash/lesions?: No Itching?: No  Eyes Blurred vision?: No Double vision?: No  Ears/Nose/Throat Sore throat?: No Sinus problems?: No  Hematologic/Lymphatic Swollen glands?: No Easy bruising?: No  Cardiovascular Leg swelling?: No Chest pain?: No  Respiratory Cough?: No Shortness of breath?: No  Endocrine Excessive thirst?: No  Musculoskeletal Back pain?: No Joint pain?: No  Neurological Headaches?: No Dizziness?: No  Psychologic Depression?: No Anxiety?: No  Physical Exam: BP 103/66 mmHg  Pulse 67  Resp 16  Ht 5' 5.5" (1.664 m)  Wt 97 lb 4.8 oz (44.135 kg)  BMI 15.94 kg/m2  Constitutional:  Alert and oriented, No acute distress. Psychiatric: Normal mood and affect.  Laboratory Data: Lab Results  Component Value Date   WBC 7.5 07/29/2013   HGB 12.8 07/29/2013   HCT 38.6 07/29/2013   MCV 90 07/29/2013   PLT 193 07/29/2013    Lab Results  Component Value Date   CREATININE 0.69 07/29/2013    No results found for: PSA  No results found for: TESTOSTERONE  No results found for: HGBA1C  Urinalysis  Pertinent Imaging: CLINICAL DATA: Bilateral flank region pain  EXAM: ABDOMEN - 1 VIEW  COMPARISON: April 13, 2013 abdominal radiograph; CT abdomen and pelvis April 19, 2013  FINDINGS: Small calcifications in the mid right kidney measuring 1-2 mm remain stable. Small calcifications in the lower left pelvis most likely represent phleboliths. There is moderate stool throughout the colon. The bowel gas pattern is unremarkable without obstruction or free air. Lung bases are clear.  IMPRESSION: Stable small calcifications in right kidney. Probable phleboliths lower left pelvis. Bowel gas pattern unremarkable.   Assessment & Plan:    1. Recurrent UTI-  We will continue suppressive  Macrobid and we will consider ESWL for small right renal stones which may be nidus for recurrent infections. I will consult his supervising physician for further recommendations regarding possible as well for small right renal stones. - Urinalysis, Complete  2. Nephrolithiasis- Patient with stable small callus calcifications in right kidney. Probable phleboliths lower left pelvis. Bowel gas plaque pattern unremarkable. Patient reports continued right flank pain. We discussed that this is most likely unrelated to her small renal stones. She will go have her renal ultrasound done to check for any possible obstruction. Patient is requesting ESWL for removal of stones. She understands that this is unlikely to help with her chronic right flank pain. I will discuss this further with one of the physicians and review her KUB images. I will call her with her renal ultrasound results and position recommendations.   No Follow-up on file.  Herbert Moors, [facility] [address]  [place], [place] [address] [phone]

## 2015-05-27 ENCOUNTER — Ambulatory Visit: Payer: Medicaid Other

## 2015-05-28 ENCOUNTER — Telehealth: Payer: Self-pay | Admitting: Obstetrics and Gynecology

## 2015-05-28 NOTE — Telephone Encounter (Signed)
Please notify patient that I have spoken our Urologists and they agreed that her small stones are not the source of her pain. Since they are so small she is not a candidate for lithotripsy.  We can discuss this further at her f/u to review her RUS results. thanks

## 2015-05-28 NOTE — Telephone Encounter (Signed)
Left mess to call/SW 

## 2015-06-04 ENCOUNTER — Ambulatory Visit
Admission: RE | Admit: 2015-06-04 | Discharge: 2015-06-04 | Disposition: A | Discharge status: Home / Self Care | Payer: Medicaid Other | Source: Ambulatory Visit | Attending: Obstetrics and Gynecology | Admitting: Obstetrics and Gynecology

## 2015-06-04 DIAGNOSIS — R109 Unspecified abdominal pain: Secondary | ICD-10-CM | POA: Diagnosis not present

## 2015-08-25 NOTE — L&D Delivery Note (Signed)
Obstetrical Delivery Note   Date of Delivery:   04/07/2016 Primary OB:   [facility] Gestational Age/EDD: [redacted]w[redacted]d  Antepartum complications: none  Delivered By:   Bonney Roussel, CNM  Delivery Type:   spontaneous vaginal delivery  Delivery Details:   Quick second stage. Infant to maternal abdomen for delayed cord clamping, cut by FOB after pulsation stopped. Infant to skin-to-skin with mother.  Anesthesia:    epidural Intrapartum complications: None GBS:    Positive -treated with Ampicillin Laceration:    n/a Episiotomy:    none Placenta:    Delivered via active management. To pathology: no.  Estimated Blood Loss:  200 mL Baby's Name:   Ayden Baby:    Liveborn female, [place] 7/9, weight 3210gm

## 2015-09-04 ENCOUNTER — Encounter: Payer: Self-pay | Admitting: *Deleted

## 2015-09-04 ENCOUNTER — Emergency Department: Payer: Medicaid Other

## 2015-09-04 ENCOUNTER — Emergency Department
Admission: EM | Admit: 2015-09-04 | Discharge: 2015-09-05 | Disposition: A | Discharge status: Home / Self Care | Payer: Medicaid Other | Attending: Emergency Medicine | Admitting: Emergency Medicine

## 2015-09-04 DIAGNOSIS — F17200 Nicotine dependence, unspecified, uncomplicated: Secondary | ICD-10-CM | POA: Diagnosis not present

## 2015-09-04 DIAGNOSIS — Z79899 Other long term (current) drug therapy: Secondary | ICD-10-CM | POA: Diagnosis not present

## 2015-09-04 DIAGNOSIS — O209 Hemorrhage in early pregnancy, unspecified: Secondary | ICD-10-CM | POA: Diagnosis present

## 2015-09-04 DIAGNOSIS — N939 Abnormal uterine and vaginal bleeding, unspecified: Secondary | ICD-10-CM

## 2015-09-04 DIAGNOSIS — Z3A01 Less than 8 weeks gestation of pregnancy: Secondary | ICD-10-CM | POA: Insufficient documentation

## 2015-09-04 DIAGNOSIS — Z3491 Encounter for supervision of normal pregnancy, unspecified, first trimester: Secondary | ICD-10-CM

## 2015-09-04 LAB — URINALYSIS COMPLETE WITH MICROSCOPIC (ARMC ONLY)
Bilirubin Urine: NEGATIVE
GLUCOSE, UA: NEGATIVE mg/dL
HGB URINE DIPSTICK: NEGATIVE
KETONES UR: NEGATIVE mg/dL
Leukocytes, UA: NEGATIVE
NITRITE: NEGATIVE
Protein, ur: NEGATIVE mg/dL
Specific Gravity, Urine: 1.014 (ref 1.005–1.030)
WBC UA: NONE SEEN WBC/hpf (ref 0–5)
pH: 7 (ref 5.0–8.0)

## 2015-09-04 LAB — CBC WITH DIFFERENTIAL/PLATELET
Basophils Absolute: 0 10*3/uL (ref 0–0.1)
Basophils Relative: 0 %
EOS ABS: 0.2 10*3/uL (ref 0–0.7)
EOS PCT: 2 %
HCT: 41.4 % (ref 35.0–47.0)
Hemoglobin: 13.9 g/dL (ref 12.0–16.0)
Lymphocytes Relative: 22 %
Lymphs Abs: 3 10*3/uL (ref 1.0–3.6)
MCH: 30.7 pg (ref 26.0–34.0)
MCHC: 33.5 g/dL (ref 32.0–36.0)
MCV: 91.6 fL (ref 80.0–100.0)
Monocytes Absolute: 0.7 10*3/uL (ref 0.2–0.9)
Monocytes Relative: 5 %
NEUTROS PCT: 71 %
Neutro Abs: 9.5 10*3/uL — ABNORMAL HIGH (ref 1.4–6.5)
PLATELETS: 203 10*3/uL (ref 150–440)
RBC: 4.52 MIL/uL (ref 3.80–5.20)
RDW: 13 % (ref 11.5–14.5)
WBC: 13.5 10*3/uL — AB (ref 3.6–11.0)

## 2015-09-04 LAB — COMPREHENSIVE METABOLIC PANEL
ALT: 19 U/L (ref 14–54)
ANION GAP: 6 (ref 5–15)
AST: 21 U/L (ref 15–41)
Albumin: 4.2 g/dL (ref 3.5–5.0)
Alkaline Phosphatase: 44 U/L (ref 38–126)
BUN: 16 mg/dL (ref 6–20)
CO2: 23 mmol/L (ref 22–32)
Calcium: 8.8 mg/dL — ABNORMAL LOW (ref 8.9–10.3)
Chloride: 106 mmol/L (ref 101–111)
Creatinine, Ser: 0.82 mg/dL (ref 0.44–1.00)
GFR calc non Af Amer: >60 mL/min (ref >60)
Glucose, Bld: 99 mg/dL (ref 65–99)
POTASSIUM: 3.7 mmol/L (ref 3.5–5.1)
Sodium: 135 mmol/L (ref 135–145)
Total Bilirubin: 0.7 mg/dL (ref 0.3–1.2)
Total Protein: 6.8 g/dL (ref 6.5–8.1)

## 2015-09-04 LAB — WET PREP, GENITAL
CLUE CELLS WET PREP: NONE SEEN
TRICH WET PREP: NONE SEEN
Yeast Wet Prep HPF POC: NONE SEEN

## 2015-09-04 LAB — TYPE AND SCREEN
ABO/RH(D): A POS
Antibody Screen: NEGATIVE

## 2015-09-04 LAB — ABO/RH: ABO/RH(D): A POS

## 2015-09-04 LAB — HCG, QUANTITATIVE, PREGNANCY: HCG, BETA CHAIN, QUANT, S: 28390 m[IU]/mL — AB (ref <5)

## 2015-09-04 NOTE — ED Notes (Signed)
Pt reports 4 positive home pregnancy test.  Pt states vaginal bleeding for 3 days with abd pain.  No dysuria.  No back pain.  Pt has nausea.

## 2015-09-04 NOTE — ED Provider Notes (Signed)
[facility] Emergency Department Provider Note ____________________________________________  Time seen: 2200  I have reviewed the triage vital signs and the nursing notes.  HISTORY  Chief Complaint  Vaginal Bleeding  HPI Jennifer Richmond is a 30 y.o. female since to the ED for evaluation of scant, intermittent vaginal bleeding for the last 3 days. She reports her last normal LMP at 06-25-15, and notes irregular menstrual periods. She also notes she's had 4 separate home pregnancy tests that have all confirmed positive. She is denying any fevers, chills, pelvic pain or sweats. She also notes some mild abdominal pain and scant, malodorous vaginal discharge.   Past Medical History  Diagnosis Date  . Kidney stones   . Panic attacks   . Abdominal pain   . Anxiety   . Back pain   . GERD (gastroesophageal reflux disease)   . Bipolar disorder ([place])   . Asthma   . PTSD (post-traumatic stress disorder)   . UTI (urinary tract infection)   . Urinary hesitancy   . Gross hematuria     There are no active problems to display for this patient.   Past Surgical History  Procedure Laterality Date  . Dilation and curettage of uterus    . Ovarian resection      Current Outpatient Rx  Name  Route  Sig  Dispense  Refill  . clonazePAM (KLONOPIN) 1 MG tablet   Oral   Take 1 mg by mouth 2 (two) times daily.         . metroNIDAZOLE (FLAGYL) 500 MG tablet   Oral   Take 500 mg by mouth 3 (three) times daily.         . nitrofurantoin, macrocrystal-monohydrate, (MACROBID) 100 MG capsule   Oral   Take 1 capsule (100 mg total) by mouth daily.   30 capsule   12   . ondansetron (ZOFRAN) 4 MG tablet   Oral   Take 1 tablet (4 mg total) by mouth every 6 (six) hours as needed for nausea or vomiting.   15 tablet   0   . tamsulosin (FLOMAX) 0.4 MG CAPS capsule   Oral   Take 1 capsule (0.4 mg total) by mouth daily.   30 capsule   12   . traMADol (ULTRAM) 50 MG  tablet   Oral   Take by mouth every 6 (six) hours as needed.         . TRAZODONE & DIET MANAGE PROD PO   Oral   Take by mouth.           Allergies Review of patient's allergies indicates no known allergies.  Family History  Problem Relation Age of Onset  . Diabetes Father   . Kidney nephrosis Father   . Seizures Mother     Social History Social History  Substance Use Topics  . Smoking status: Current Every Day Smoker  . Smokeless tobacco: None  . Alcohol Use: No   Review of Systems  Constitutional: Negative for fever. Eyes: Negative for visual changes. ENT: Negative for sore throat. Cardiovascular: Negative for chest pain. Respiratory: Negative for shortness of breath. Gastrointestinal: Negative for abdominal pain, vomiting and diarrhea. Genitourinary: Negative for dysuria. Musculoskeletal: Negative for back pain. Skin: Negative for rash. Neurological: Negative for headaches, focal weakness or numbness. Pregnancy: [id number] ____________________________________________  PHYSICAL EXAM:  VITAL SIGNS: ED Triage Vitals  Enc Vitals Group     BP 09/04/15 2151 103/53 mmHg     Pulse Rate 09/04/15 2151  76     Resp 09/04/15 2151 20     Temp 09/04/15 2151 98.9 F (37.2 C)     Temp Source 09/04/15 2151 Oral     SpO2 09/04/15 2151 100 %     Weight 09/04/15 2151 105 lb (47.628 kg)     Height 09/04/15 2151 5\' 5"  (1.651 m)     Head Cir --      Peak Flow --      Pain Score --      Pain Loc --      Pain Edu? --      Excl. in [place]? --    Constitutional: Alert and oriented. Well appearing and in no distress. Head: Normocephalic and atraumatic.      Eyes: Conjunctivae are normal. PERRL. Normal extraocular movements      Ears: Canals clear. TMs intact bilaterally.   Nose: No congestion/rhinorrhea.   Mouth/Throat: Mucous membranes are moist.   Neck: Supple. No thyromegaly. Hematological/Lymphatic/Immunological: No cervical lymphadenopathy. Cardiovascular:  Normal rate, regular rhythm.  Respiratory: Normal respiratory effort. No wheezes/rales/rhonchi. Gastrointestinal: Soft and nontender. No distention. GU: wet prep collected by patient. Exam otherwise deferred. Musculoskeletal: Nontender with normal range of motion in all extremities.  Neurologic:  Normal gait without ataxia. Normal speech and language. No gross focal neurologic deficits are appreciated. Skin:  Skin is warm, dry and intact. No rash noted. Psychiatric: Mood and affect are normal. Patient exhibits appropriate insight and judgment. ____________________________________________   LABS (pertinent positives/negatives) Labs Reviewed  WET PREP, GENITAL - Abnormal; Notable for the following:    WBC, Wet Prep HPF POC FEW (*)    All other components within normal limits  CBC WITH DIFFERENTIAL/PLATELET - Abnormal; Notable for the following:    WBC 13.5 (*)    Neutro Abs 9.5 (*)    All other components within normal limits  URINALYSIS COMPLETEWITH MICROSCOPIC ([facility] ONLY) - Abnormal; Notable for the following:    Color, Urine YELLOW (*)    APPearance HAZY (*)    Bacteria, UA RARE (*)    Squamous Epithelial / LPF 0-5 (*)    All other components within normal limits  HCG, QUANTITATIVE, PREGNANCY - Abnormal; Notable for the following:    hCG, Beta Chain, Quant, S 28390 (*)    All other components within normal limits  COMPREHENSIVE METABOLIC PANEL - Abnormal; Notable for the following:    Calcium 8.8 (*)    All other components within normal limits  TYPE AND SCREEN  ABO/RH  ____________________________________________   RADIOLOGY OB Ultrasound  IMPRESSION: Single live intrauterine pregnancy estimated gestational age [redacted] weeks 1 day for estimated date of delivery 04/28/2016. No complication.  I, Obe Ahlers, Dannielle Karvonen, personally viewed these images as part of my medical decision making, as well as reviewing the written report by the  radiologist. ____________________________________________  INITIAL IMPRESSION / [place] / ED COURSE  Single IUP confirmed via ultrasound. [facility] 04/28/2016. Patient discharged with prescription for Zofran ODT to dose prn. Follow-up with [facility] as planned.  ____________________________________________  FINAL CLINICAL IMPRESSION(S) / ED DIAGNOSES  Final diagnoses:  Normal IUP (intrauterine pregnancy) on prenatal ultrasound, first trimester  Vaginal bleeding before [redacted] weeks gestation      Dannielle Karvonen [place], PA-C 09/05/15 0052  Lisa Roca, MD 09/07/15 1432

## 2015-09-05 MED ORDER — ONDANSETRON HCL 4 MG PO TABS: 4.0000 mg | ORAL_TABLET | Freq: Four times a day (QID) | ORAL | Status: DC | PRN

## 2015-09-05 NOTE — Discharge Instructions (Signed)
First Trimester of Pregnancy  The first trimester of pregnancy is from week 1 until the end of week 12 (months 1 through 3). A week after a sperm fertilizes an egg, the egg will implant on the wall of the uterus. This embryo will begin to develop into a baby. Genes from you and your partner are forming the baby. The female genes determine whether the baby is a boy or a girl. At 6-8 weeks, the eyes and face are formed, and the heartbeat can be seen on ultrasound. At the end of 12 weeks, all the baby's organs are formed.   Now that you are pregnant, you will want to do everything you can to have a healthy baby. Two of the most important things are to get good prenatal care and to follow your health care provider's instructions. Prenatal care is all the medical care you receive before the baby's birth. This care will help prevent, find, and treat any problems during the pregnancy and childbirth.  BODY CHANGES  Your body goes through many changes during pregnancy. The changes vary from woman to woman.   · You may gain or lose a couple of pounds at first.  · You may feel sick to your stomach (nauseous) and throw up (vomit). If the vomiting is uncontrollable, call your health care provider.  · You may tire easily.  · You may develop headaches that can be relieved by medicines approved by your health care provider.  · You may urinate more often. Painful urination may mean you have a bladder infection.  · You may develop heartburn as a result of your pregnancy.  · You may develop constipation because certain hormones are causing the muscles that push waste through your intestines to slow down.  · You may develop hemorrhoids or swollen, bulging veins (varicose veins).  · Your breasts may begin to grow larger and become tender. Your nipples may stick out more, and the tissue that surrounds them (areola) may become darker.  · Your gums may bleed and may be sensitive to brushing and flossing.   · Dark spots or blotches (chloasma, mask of pregnancy) may develop on your face. This will likely fade after the baby is born.  · Your menstrual periods will stop.  · You may have a loss of appetite.  · You may develop cravings for certain kinds of food.  · You may have changes in your emotions from day to day, such as being excited to be pregnant or being concerned that something may go wrong with the pregnancy and baby.  · You may have more vivid and strange dreams.  · You may have changes in your hair. These can include thickening of your hair, rapid growth, and changes in texture. Some women also have hair loss during or after pregnancy, or hair that feels dry or thin. Your hair will most likely return to normal after your baby is born.  WHAT TO EXPECT AT YOUR PRENATAL VISITS  During a routine prenatal visit:  · You will be weighed to make sure you and the baby are growing normally.  · Your blood pressure will be taken.  · Your abdomen will be measured to track your baby's growth.  · The fetal heartbeat will be listened to starting around week 10 or 12 of your pregnancy.  · Test results from any previous visits will be discussed.  Your health care provider may ask you:  · How you are feeling.  · If you   including cigarettes, chewing tobacco, and electronic cigarettes. °· If you have any questions. °Other tests that may be performed during your first trimester include: °· Blood tests to find your blood type and to check for the presence of any previous infections. They will also be used to check for low iron levels (anemia) and Rh antibodies. Later in the pregnancy, blood tests for diabetes will be done along with other tests if problems develop. °· Urine tests to check for infections, diabetes, or protein in the urine. °· An ultrasound to confirm the proper growth  and development of the baby. °· An amniocentesis to check for possible genetic problems. °· Fetal screens for spina bifida and Down syndrome. °· You may need other tests to make sure you and the baby are doing well. °· HIV (human immunodeficiency virus) testing. Routine prenatal testing includes screening for HIV, unless you choose not to have this test. °HOME CARE INSTRUCTIONS  °Medicines °· Follow your health care provider's instructions regarding medicine use. Specific medicines may be either safe or unsafe to take during pregnancy. °· Take your prenatal vitamins as directed. °· If you develop constipation, try taking a stool softener if your health care provider approves. °Diet °· Eat regular, well-balanced meals. Choose a variety of foods, such as meat or vegetable-based protein, fish, milk and low-fat dairy products, vegetables, fruits, and whole grain breads and cereals. Your health care provider will help you determine the amount of weight gain that is right for you. °· Avoid raw meat and uncooked cheese. These carry germs that can cause birth defects in the baby. °· Eating four or five small meals rather than three large meals a day may help relieve nausea and vomiting. If you start to feel nauseous, eating a few soda crackers can be helpful. Drinking liquids between meals instead of during meals also seems to help nausea and vomiting. °· If you develop constipation, eat more high-fiber foods, such as fresh vegetables or fruit and whole grains. Drink enough fluids to keep your urine clear or pale yellow. °Activity and Exercise °· Exercise only as directed by your health care provider. Exercising will help you: °¨ Control your weight. °¨ Stay in shape. °¨ Be prepared for labor and delivery. °· Experiencing pain or cramping in the lower abdomen or low back is a good sign that you should stop exercising. Check with your health care provider before continuing normal exercises. °· Try to avoid standing for long  periods of time. Move your legs often if you must stand in one place for a long time. °· Avoid heavy lifting. °· Wear low-heeled shoes, and practice good posture. °· You may continue to have sex unless your health care provider directs you otherwise. °Relief of Pain or Discomfort °· Wear a good support bra for breast tenderness.   °· Take warm sitz baths to soothe any pain or discomfort caused by hemorrhoids. Use hemorrhoid cream if your health care provider approves.   °· Rest with your legs elevated if you have leg cramps or low back pain. °· If you develop varicose veins in your legs, wear support hose. Elevate your feet for 15 minutes, 3-4 times a day. Limit salt in your diet. °Prenatal Care °· Schedule your prenatal visits by the twelfth week of pregnancy. They are usually scheduled monthly at first, then more often in the last 2 months before delivery. °· Write down your questions. Take them to your prenatal visits. °· Keep all your prenatal visits as directed by your   health care provider. Safety  Wear your seat belt at all times when driving.  Make a list of emergency phone numbers, including numbers for family, friends, the hospital, and police and fire departments. General Tips  Ask your health care provider for a referral to a local prenatal education class. Begin classes no later than at the beginning of month 6 of your pregnancy.  Ask for help if you have counseling or nutritional needs during pregnancy. Your health care provider can offer advice or refer you to specialists for help with various needs.  Do not use hot tubs, steam rooms, or saunas.  Do not douche or use tampons or scented sanitary pads.  Do not cross your legs for long periods of time.  Avoid cat litter boxes and soil used by cats. These carry germs that can cause birth defects in the baby and possibly loss of the fetus by miscarriage or stillbirth.  Avoid all smoking, herbs, alcohol, and medicines not prescribed by  your health care provider. Chemicals in these affect the formation and growth of the baby.  Do not use any tobacco products, including cigarettes, chewing tobacco, and electronic cigarettes. If you need help quitting, ask your health care provider. You may receive counseling support and other resources to help you quit.  Schedule a dentist appointment. At home, brush your teeth with a soft toothbrush and be gentle when you floss. SEEK MEDICAL CARE IF:   You have dizziness.  You have mild pelvic cramps, pelvic pressure, or nagging pain in the abdominal area.  You have persistent nausea, vomiting, or diarrhea.  You have a bad smelling vaginal discharge.  You have pain with urination.  You notice increased swelling in your face, hands, legs, or ankles. SEEK IMMEDIATE MEDICAL CARE IF:   You have a fever.  You are leaking fluid from your vagina.  You have spotting or bleeding from your vagina.  You have severe abdominal cramping or pain.  You have rapid weight gain or loss.  You vomit blood or material that looks like coffee grounds.  You are exposed to [place] measles and have never had them.  You are exposed to fifth disease or chickenpox.  You develop a severe headache.  You have shortness of breath.  You have any kind of trauma, such as from a fall or a car accident.   This information is not intended to replace advice given to you by your health care provider. Make sure you discuss any questions you have with your health care provider.   Document Released: 08/04/2001 Document Revised: 08/31/2014 Document Reviewed: 06/20/2013 Elsevier Interactive Patient Education 2016 [employer]  Vaginal Bleeding During Pregnancy, First Trimester A small amount of bleeding (spotting) from the vagina is common in early pregnancy. Sometimes the bleeding is normal and is not a problem, and sometimes it is a sign of something serious. Be sure to tell your doctor about any bleeding from  your vagina right away. HOME CARE  Watch your condition for any changes.  Follow your doctor's instructions about how active you can be.  If you are on bed rest:  You may need to stay in bed and only get up to use the bathroom.  You may be allowed to do some activities.  If you need help, make plans for someone to help you.  Write down:  The number of pads you use each day.  How often you change pads.  How soaked (saturated) your pads are.  Do not use  tampons.  Do not douche.  Do not have sex or orgasms until your doctor says it is okay.  If you pass any tissue from your vagina, save the tissue so you can show it to your doctor.  Only take medicines as told by your doctor.  Do not take aspirin because it can make you bleed.  Keep all follow-up visits as told by your doctor. GET HELP IF:   You bleed from your vagina.  You have cramps.  You have labor pains.  You have a fever that does not go away after you take medicine. GET HELP RIGHT AWAY IF:   You have very bad cramps in your back or belly (abdomen).  You pass large clots or tissue from your vagina.  You bleed more.  You feel light-headed or weak.  You pass out (faint).  You have chills.  You are leaking fluid or have a gush of fluid from your vagina.  You pass out while pooping (having a bowel movement). MAKE SURE YOU:  Understand these instructions.  Will watch your condition.  Will get help right away if you are not doing well or get worse.   This information is not intended to replace advice given to you by your health care provider. Make sure you discuss any questions you have with your health care provider.   Document Released: 12/25/2013 Document Reviewed: 12/25/2013 Elsevier Interactive Patient Education [employer].  Your exam and labs and ultrasound confirm a normal early pregnancy. Continue to monitor symptoms. Follow-up with your provider for ongoing symptoms.

## 2016-02-25 ENCOUNTER — Observation Stay: Payer: Medicaid Other

## 2016-02-25 ENCOUNTER — Observation Stay
Admission: EM | Admit: 2016-02-25 | Discharge: 2016-02-27 | Disposition: A | Discharge status: Home / Self Care | Payer: Medicaid Other | Attending: Obstetrics & Gynecology | Admitting: Obstetrics & Gynecology

## 2016-02-25 DIAGNOSIS — R3911 Hesitancy of micturition: Secondary | ICD-10-CM | POA: Insufficient documentation

## 2016-02-25 DIAGNOSIS — F410 Panic disorder [episodic paroxysmal anxiety] without agoraphobia: Secondary | ICD-10-CM | POA: Diagnosis not present

## 2016-02-25 DIAGNOSIS — M549 Dorsalgia, unspecified: Secondary | ICD-10-CM | POA: Insufficient documentation

## 2016-02-25 DIAGNOSIS — O99613 Diseases of the digestive system complicating pregnancy, third trimester: Secondary | ICD-10-CM | POA: Diagnosis not present

## 2016-02-25 DIAGNOSIS — F419 Anxiety disorder, unspecified: Secondary | ICD-10-CM | POA: Diagnosis not present

## 2016-02-25 DIAGNOSIS — Z820 Family history of epilepsy and other diseases of the nervous system: Secondary | ICD-10-CM | POA: Diagnosis not present

## 2016-02-25 DIAGNOSIS — O99513 Diseases of the respiratory system complicating pregnancy, third trimester: Secondary | ICD-10-CM | POA: Diagnosis not present

## 2016-02-25 DIAGNOSIS — O26833 Pregnancy related renal disease, third trimester: Principal | ICD-10-CM | POA: Insufficient documentation

## 2016-02-25 DIAGNOSIS — Z79899 Other long term (current) drug therapy: Secondary | ICD-10-CM | POA: Insufficient documentation

## 2016-02-25 DIAGNOSIS — R310 Gross hematuria: Secondary | ICD-10-CM | POA: Insufficient documentation

## 2016-02-25 DIAGNOSIS — F319 Bipolar disorder, unspecified: Secondary | ICD-10-CM | POA: Diagnosis not present

## 2016-02-25 DIAGNOSIS — K219 Gastro-esophageal reflux disease without esophagitis: Secondary | ICD-10-CM | POA: Diagnosis not present

## 2016-02-25 DIAGNOSIS — Z833 Family history of diabetes mellitus: Secondary | ICD-10-CM | POA: Diagnosis not present

## 2016-02-25 DIAGNOSIS — N132 Hydronephrosis with renal and ureteral calculous obstruction: Secondary | ICD-10-CM | POA: Insufficient documentation

## 2016-02-25 DIAGNOSIS — O26899 Other specified pregnancy related conditions, unspecified trimester: Secondary | ICD-10-CM

## 2016-02-25 DIAGNOSIS — R309 Painful micturition, unspecified: Secondary | ICD-10-CM

## 2016-02-25 DIAGNOSIS — F4310 Post-traumatic stress disorder, unspecified: Secondary | ICD-10-CM | POA: Insufficient documentation

## 2016-02-25 DIAGNOSIS — Z3A31 31 weeks gestation of pregnancy: Secondary | ICD-10-CM | POA: Diagnosis not present

## 2016-02-25 DIAGNOSIS — Z841 Family history of disorders of kidney and ureter: Secondary | ICD-10-CM | POA: Diagnosis not present

## 2016-02-25 DIAGNOSIS — R109 Unspecified abdominal pain: Secondary | ICD-10-CM

## 2016-02-25 DIAGNOSIS — N200 Calculus of kidney: Secondary | ICD-10-CM

## 2016-02-25 DIAGNOSIS — R350 Frequency of micturition: Secondary | ICD-10-CM | POA: Diagnosis not present

## 2016-02-25 DIAGNOSIS — R1084 Generalized abdominal pain: Secondary | ICD-10-CM

## 2016-02-25 DIAGNOSIS — Z87442 Personal history of urinary calculi: Secondary | ICD-10-CM | POA: Insufficient documentation

## 2016-02-25 DIAGNOSIS — O99333 Smoking (tobacco) complicating pregnancy, third trimester: Secondary | ICD-10-CM | POA: Diagnosis not present

## 2016-02-25 DIAGNOSIS — O99343 Other mental disorders complicating pregnancy, third trimester: Secondary | ICD-10-CM | POA: Diagnosis not present

## 2016-02-25 DIAGNOSIS — J45909 Unspecified asthma, uncomplicated: Secondary | ICD-10-CM | POA: Insufficient documentation

## 2016-02-25 LAB — URINALYSIS COMPLETE WITH MICROSCOPIC (ARMC ONLY)
BILIRUBIN URINE: NEGATIVE
Glucose, UA: NEGATIVE mg/dL
KETONES UR: NEGATIVE mg/dL
LEUKOCYTES UA: NEGATIVE
Nitrite: NEGATIVE
PH: 6 (ref 5.0–8.0)
Protein, ur: NEGATIVE mg/dL
Specific Gravity, Urine: 1.014 (ref 1.005–1.030)

## 2016-02-25 MED ORDER — TAMSULOSIN HCL 0.4 MG PO CAPS
0.4000 mg | ORAL_CAPSULE | Freq: Every day | ORAL | Status: DC
  Administered 2016-02-25 – 2016-02-27 (×3): 0.4 mg via ORAL
  Filled 2016-02-25 (×3): qty 1

## 2016-02-25 MED ORDER — ACETAMINOPHEN 325 MG PO TABS
650.0000 mg | ORAL_TABLET | ORAL | Status: DC | PRN
  Administered 2016-02-25: 650 mg via ORAL
  Filled 2016-02-25: qty 2

## 2016-02-25 MED ORDER — CALCIUM CARBONATE ANTACID 500 MG PO CHEW: 2.0000 | CHEWABLE_TABLET | ORAL | Status: DC | PRN

## 2016-02-25 MED ORDER — LACTATED RINGERS IV SOLN
INTRAVENOUS | Status: DC
  Administered 2016-02-25 – 2016-02-26 (×3): via INTRAVENOUS

## 2016-02-25 MED ORDER — ONDANSETRON HCL 4 MG PO TABS: 4.0000 mg | ORAL_TABLET | Freq: Four times a day (QID) | ORAL | Status: DC | PRN

## 2016-02-25 MED ORDER — MORPHINE SULFATE (PF) 2 MG/ML IV SOLN
1.0000 mg | INTRAVENOUS | Status: DC | PRN
  Administered 2016-02-25 (×2): 2 mg via INTRAVENOUS
  Filled 2016-02-25 (×2): qty 1

## 2016-02-25 MED ORDER — PRENATAL MULTIVITAMIN CH
1.0000 | ORAL_TABLET | Freq: Every day | ORAL | Status: DC
  Administered 2016-02-25 – 2016-02-26 (×2): 1 via ORAL
  Filled 2016-02-25 (×2): qty 1

## 2016-02-25 MED ORDER — ACETAMINOPHEN 325 MG PO TABS
650.0000 mg | ORAL_TABLET | ORAL | Status: DC | PRN
  Administered 2016-02-26 – 2016-02-27 (×3): 650 mg via ORAL
  Filled 2016-02-25 (×4): qty 2

## 2016-02-25 MED ORDER — OXYCODONE HCL 5 MG PO TABS
5.0000 mg | ORAL_TABLET | ORAL | Status: DC | PRN
  Administered 2016-02-25 – 2016-02-27 (×7): 5 mg via ORAL
  Filled 2016-02-25 (×7): qty 1

## 2016-02-25 MED ORDER — OXYCODONE-ACETAMINOPHEN 5-325 MG PO TABS: 1.0000 | ORAL_TABLET | ORAL | Status: DC | PRN

## 2016-02-25 MED ORDER — ALUM & MAG HYDROXIDE-SIMETH 200-200-20 MG/5ML PO SUSP
30.0000 mL | ORAL | Status: DC | PRN
  Filled 2016-02-25: qty 30

## 2016-02-25 MED ORDER — ACETAMINOPHEN 10 MG/ML IV SOLN
1000.0000 mg | Freq: Four times a day (QID) | INTRAVENOUS | Status: AC
Start: 2016-02-25 — End: 2016-02-26
  Administered 2016-02-25 – 2016-02-26 (×4): 1000 mg via INTRAVENOUS
  Filled 2016-02-25 (×6): qty 100

## 2016-02-25 MED ORDER — ONDANSETRON HCL 4 MG/2ML IJ SOLN
4.0000 mg | Freq: Four times a day (QID) | INTRAMUSCULAR | Status: DC | PRN
  Administered 2016-02-25: 4 mg via INTRAVENOUS
  Filled 2016-02-25: qty 2

## 2016-02-25 NOTE — Progress Notes (Signed)
Benign Gynecology Progress Note    Subjective:  Pt cont w pain in back R>L,  Nausea after pain meds.  No ctxs.   Objective:   Filed Vitals:   02/25/16 0844 02/25/16 1058 02/25/16 1715 02/25/16 1906  BP:  114/60 112/62 115/77  Pulse:  72 64 73  Temp:  98.1 F (36.7 C) 98 F (36.7 C) 98.3 F (36.8 C)  TempSrc:  Oral Oral Oral  Resp:  20 20 20   Height: 5\' 5"  (1.651 m)     Weight: 72.576 kg (160 lb)     SpO2:  97%  97%   Temp:  [97.7 F (36.5 C)-98.8 F (37.1 C)] 98.3 F (36.8 C) (07/04 1906) Pulse Rate:  [64-85] 73 (07/04 1906) Resp:  [16-20] 20 (07/04 1906) BP: (112-132)/(60-77) 115/77 mmHg (07/04 1906) SpO2:  [97 %] 97 % (07/04 1906) Weight:  [72.576 kg (160 lb)] 72.576 kg (160 lb) (07/04 0844) I/O last 3 completed shifts: In: 270 [P.O.:120; I.V.:150] Out: 850 [Urine:850]    Intake/Output Summary (Last 24 hours) at 02/25/16 2035 Last data filed at 02/25/16 1830  Gross per 24 hour  Intake    270 ml  Output    850 ml  Net   -580 ml                                Physical exam: General appearance: alert, cooperative and appears stated age Abdomen: soft, non-tender; bowel sounds normal; no masses, no organomegaly  Back: no CVAT, RIGHT flank T GU: No gross VB Extremities: no redness or tenderness in the calves or thighs, no edema Psych: appropriate  Labs:  UA- 2+ Hgb, 0-5 WBC, neg nitrite/leuk/ketone  Radiology CLINICAL DATA: Acute onset of right flank discomfort. Initial encounter.  EXAM: RENAL / URINARY TRACT ULTRASOUND COMPLETE  COMPARISON: Renal ultrasound performed 06/04/2015  FINDINGS: Right Kidney:  Length: 12.0 cm. Moderate right-sided hydronephrosis is noted. An 8 mm nonobstructing stone is noted at the lower pole of the right kidney.  Left Kidney:  Length: 11.2 cm. Echogenicity within normal limits. No mass or hydronephrosis visualized.  Bladder:  Decompressed, status post recent voiding.  IMPRESSION: 1. Moderate  right-sided hydronephrosis noted, raising concern for distal obstruction. 2. 8 mm nonobstructing stone at the lower pole of the right kidney.   Electronically Signed  By: Garald Balding M.D.  Assessment & Plan:  30 yo at [redacted] weeks EGA with RIGHT nephrolithiasis and hydronephrosis  Cont IVF and Analgesia Urology consultation for management- started Flomax and assessing for passage of stone, vs procedure or CT assessment FHTs daily, monitor FWB as needed

## 2016-02-25 NOTE — Consult Note (Signed)
Urology Consult   Physician requesting consult: Tomasita Crumble  Reason for consult: Intrauterine pregnancy, probable right ureteral stone  History of Present Illness: Jennifer Richmond is a 30 y.o. female who has a history of recurrent urolithiasis. She is followed here in [place] by [facility]. She has passed between 5 and 10 stones previously. She has never before needed surgical removal or shockwave lithotripsy for any of these stones. Patient is 31 weeks into her second pregnancy. She is admitted to the hospital with a probable stone.  She became symptomatic 3-4 days ago with lower abdominal and right flank pain. She had nausea but no vomiting. No fever or chills. Over the past day or so she has developed urinary frequency, urgency and mild dysuria. She has no left-sided pain. Her pain is reminiscent of prior kidney stones. Renal ultrasound revealed right hydronephrosis, a lower pole stone on the right probably 8 mm in size, no ureteral jet on the right.  Urologic consultation is requested.  She denies a history of voiding or storage urinary symptoms, hematuria, UTIs, STDs, urolithiasis, GU malignancy/trauma/surgery.  Past Medical History  Diagnosis Date  . Kidney stones   . Panic attacks   . Abdominal pain   . Anxiety   . Back pain   . GERD (gastroesophageal reflux disease)   . Bipolar disorder ([place])   . Asthma   . PTSD (post-traumatic stress disorder)   . UTI (urinary tract infection)   . Urinary hesitancy   . Gross hematuria     Past Surgical History  Procedure Laterality Date  . Dilation and curettage of uterus    . Ovarian resection       Current Hospital Medications: Scheduled Meds: . acetaminophen  1,000 mg Intravenous Q6H  . prenatal multivitamin  1 tablet Oral Q1200  . tamsulosin  0.4 mg Oral Daily   Continuous Infusions: . lactated ringers 75 mL/hr at 02/25/16 0930   PRN Meds:.acetaminophen, alum & mag hydroxide-simeth, morphine injection,  ondansetron **OR** ondansetron (ZOFRAN) IV, oxyCODONE-acetaminophen  Allergies: No Known Allergies  Family History  Problem Relation Age of Onset  . Diabetes Father   . Kidney nephrosis Father   . Seizures Mother     Social History:  reports that she has been smoking Cigarettes.  She does not have any smokeless tobacco history on file. She reports that she does not drink alcohol or use illicit drugs.  ROS: A complete review of systems was performed.  All systems are negative except for pertinent findings as noted.  Physical Exam:  Vital signs in last 24 hours: Temp:  [97.7 F (36.5 C)-98.8 F (37.1 C)] 98.1 F (36.7 C) (07/04 1058) Pulse Rate:  [69-85] 72 (07/04 1058) Resp:  [16-20] 20 (07/04 1058) BP: (114-132)/(60-70) 114/60 mmHg (07/04 1058) SpO2:  [97 %] 97 % (07/04 1058) Weight:  [72.576 kg (160 lb)] 72.576 kg (160 lb) (07/04 0844) General:  Alert and oriented, No acute distress HEENT: Normocephalic, atraumatic Neck: No JVD or lymphadenopathy Cardiovascular: Regular rate and rhythm Lungs: Normal inspiratory and expiratory excursion Abdomen: Gravid. Minimal right lower quadrant tenderness. No right CVA tenderness. No rebound or guarding. Extremities: No edema Neurologic: Grossly intact  Laboratory Data:  No results for input(s): WBC, HGB, HCT, PLT in the last 72 hours.  No results for input(s): NA, K, CL, GLUCOSE, BUN, CALCIUM, CREATININE in the last 72 hours.  Invalid input(s): CO3   Results for orders placed or performed during the hospital encounter of 02/25/16 (from the past  24 hour(s))  Urinalysis complete, with microscopic ([facility] only)     Status: Abnormal   Collection Time: 02/25/16  2:03 AM  Result Value Ref Range   Color, Urine YELLOW (A) YELLOW   APPearance HAZY (A) CLEAR   Glucose, UA NEGATIVE NEGATIVE mg/dL   Bilirubin Urine NEGATIVE NEGATIVE   Ketones, ur NEGATIVE NEGATIVE mg/dL   Specific Gravity, Urine 1.014 1.005 - 1.030   Hgb urine dipstick 2+  (A) NEGATIVE   pH 6.0 5.0 - 8.0   Protein, ur NEGATIVE NEGATIVE mg/dL   Nitrite NEGATIVE NEGATIVE   Leukocytes, UA NEGATIVE NEGATIVE   RBC / HPF 6-30 0 - 5 RBC/hpf   WBC, UA 0-5 0 - 5 WBC/hpf   Bacteria, UA RARE (A) NONE SEEN   Squamous Epithelial / LPF 6-30 (A) NONE SEEN   Mucous PRESENT    No results found for this or any previous visit (from the past 240 hour(s)).  Renal Function: No results for input(s): CREATININE in the last 168 hours. CrCl cannot be calculated (Patient has no serum creatinine result on file.).  Radiologic Imaging: US Renal  02/25/2016  CLINICAL DATA:  Acute onset of right flank discomfort. Initial encounter. EXAM: RENAL / URINARY TRACT ULTRASOUND COMPLETE COMPARISON:  Renal ultrasound performed 06/04/2015 FINDINGS: Right Kidney: Length: 12.0 cm. Moderate right-sided hydronephrosis is noted. An 8 mm nonobstructing stone is noted at the lower pole of the right kidney. Left Kidney: Length: 11.2 cm. Echogenicity within normal limits. No mass or hydronephrosis visualized. Bladder: Decompressed, status post recent voiding. IMPRESSION: 1. Moderate right-sided hydronephrosis noted, raising concern for distal obstruction. 2. 8 mm nonobstructing stone at the lower pole of the right kidney. Electronically Signed   By: Garald Balding M.D.   On: 02/25/2016 05:35    I independently reviewed the above imaging studies.  Impression/Assessment:  1. Probable right distal ureteral stone. This was not imaged on renal ultrasound, but with the patient's history of urolithiasis, her right-sided hydroureteronephrosis and lower urinary tract symptomatology, I think more than likely she has a stone near her right ureterovesical junction. Currently, she has no associated infection, nausea or vomiting and is fairly comfortable.  2. Intrauterine pregnancy, 31 weeks  Plan:  1. At this point, with the patient having a distal stone, probably at the UVJ, I will add Flomax her medical regimen, as  well as IV Tylenol, which I think will manage her pain fairly well without significant side effects.  2. I agree with in-hospital management at the present time, straining her urine. If she remains significantly symptomatic, or for stone does not pass in 24 hours or so with associated pain, I would consider low-dose CT stone protocol. She is in her third trimester, and at this point, passed the worry for teratogenic effects from the ionizing radiation. I would like, if this is necessary, for her OB to sign off on that. If surgical intervention is necessary, I think that is an appropriate study to get before an anesthetic procedure  3. We will follow with you.

## 2016-02-25 NOTE — H&P (Signed)
Obstetric History and Physical  Jennifer Richmond is a 30 y.o. [phone] with Estimated Date of Delivery: 04/28/16 who presents at [redacted]w[redacted]d  presenting for c/o abdominal and flank pain. Reports going to Urgent care on 6/30 with c/o SOB and urinary sx, dx with UTI and given Rx for Macrobid. Pt received phone call today stating urine culture was negative and pt reports "feeling worse." Reporting back pain, cramping and increasing difficulty emptying her bladder. H/o inflamed urethra and kidney stones last year. Denies regular contractions, no vaginal bleeding, intact membranes, with active fetal movement.    Prenatal Course Source of Care: [facility]  (records not available at time of this note) Pregnancy complications or risks: Patient Active Problem List   Diagnosis Date Noted  . Abdominal pain affecting pregnancy 02/25/2016     Prenatal Transfer Tool   Past Medical History  Diagnosis Date  . Kidney stones   . Panic attacks   . Abdominal pain   . Anxiety   . Back pain   . GERD (gastroesophageal reflux disease)   . Bipolar disorder ([place])   . Asthma   . PTSD (post-traumatic stress disorder)   . UTI (urinary tract infection)   . Urinary hesitancy   . Gross hematuria     Past Surgical History  Procedure Laterality Date  . Dilation and curettage of uterus    . Ovarian resection      OB History  Gravida Para Term Preterm AB SAB TAB Ectopic Multiple Living  4    2 1 1   1     # Outcome Date GA Lbr Len/2nd Weight Sex Delivery Anes PTL Lv  4 Current           3 SAB 10/25/13          2 Gravida 09/23/05    M Vag-Spont   Y  1 TAB 05/27/00     TAB         Social History   Social History  . Marital Status: Married    Spouse Name: N/A  . Number of Children: N/A  . Years of Education: N/A   Social History Main Topics  . Smoking status: Current Some Day Smoker    Types: Cigarettes  . Smokeless tobacco: None  . Alcohol Use: No  . Drug Use: No  . Sexual Activity: Yes   Other Topics  Concern  . None   Social History Narrative    Family History  Problem Relation Age of Onset  . Diabetes Father   . Kidney nephrosis Father   . Seizures Mother     Prescriptions prior to admission  Medication Sig Dispense Refill Last Dose  . clonazePAM (KLONOPIN) 1 MG tablet Take 1 mg by mouth 2 (two) times daily.   Taking  . nitrofurantoin, macrocrystal-monohydrate, (MACROBID) 100 MG capsule Take 1 capsule (100 mg total) by mouth daily. 30 capsule 12 Taking    No Known Allergies  Review of Systems: Negative except for what is mentioned in HPI.  Physical Exam: BP 132/67 mmHg  Pulse 85  Temp(Src) 98.8 F (37.1 C) (Oral)  Resp 18  LMP 06/25/2015 (Exact Date) GENERAL: Well-developed, well-nourished female in no acute distress.  ABDOMEN: Soft, nontender, nondistended, gravid. CVAT: mild rt CVAT Cervical Exam: Dilatation 0 cm   Effacement 0 %   Station high FHT: Category: 1 Baseline rate 140 bpm   Variability moderate  Accelerations present   Decelerations none Contractions: rare   Pertinent Labs/Studies:   No results  found for this or any previous visit (from the past 24 hour(s)).  Assessment : IUP at [redacted]w[redacted]d back and abdominal pain, no evidence of Preterm labor  Plan: 1. UA and renal US - r/o stones/other renal or bladder concerns. Offered in & out catheter to ensure bladder is emptying which pt refused d/t intolerance of catheters previously.  2. NST q 4-6 hours unless pt begins to c/o ctx

## 2016-02-25 NOTE — Progress Notes (Signed)
Benign Gynecology Progress Note  Admission Date: 02/25/2016 Current Date: 02/25/2016  Jennifer Richmond is a 30 y.o. [id number] HD#1 @ [redacted]w[redacted]d admitted for severe back pain.  History complicated by: Patient Active Problem List   Diagnosis Date Noted  . Abdominal pain affecting pregnancy 02/25/2016  . Nephrolithiasis 02/25/2016   ROS and patient/family/surgical history, located on admission H&P note dated 02/25/2016, have been reviewed, and there are no changes except as noted below  Yesterday/Overnight Events:  Fetal monitoring reassuring Ultrasound done, confirming RIGHT sided 31mm kidney stone and mod hydronephrosis   Subjective:  Pt cont w pain in back R>L, min nausea.  No ctxs.   Objective:   Filed Vitals:   02/25/16 0051 02/25/16 0528 02/25/16 0815 02/25/16 0818  BP: 132/67 115/67  117/70  Pulse: 85 69  76  Temp: 98.8 F (37.1 C) 98.7 F (37.1 C) 97.7 F (36.5 C)   TempSrc: Oral Oral Oral   Resp: 18 16 18     Temp:  [97.7 F (36.5 C)-98.8 F (37.1 C)] 97.7 F (36.5 C) (07/04 0815) Pulse Rate:  [69-85] 76 (07/04 0818) Resp:  [16-18] 18 (07/04 0815) BP: (115-132)/(67-70) 117/70 mmHg (07/04 0818)     No intake or output data in the 24 hours ending 02/25/16 0830   Current Vital Signs 24h Vital Sign Ranges  T 97.7 F (36.5 C) Temp  Avg: 98.4 F (36.9 C)  Min: 97.7 F (36.5 C)  Max: 98.8 F (37.1 C)  BP 117/70 mmHg BP  Min: 115/67  Max: 132/67  HR 76 Pulse  Avg: 76.7  Min: 69  Max: 85  RR 18 Resp  Avg: 17.3  Min: 16  Max: 18  SaO2     No Data Recorded          Physical exam: General appearance: alert, cooperative and appears stated age Abdomen: soft, non-tender; bowel sounds normal; no masses, no organomegaly FHT- 140s Toco- no ctxs GU: No gross VB Lungs: clear to auscultation bilaterally Heart: regular rate and rhythm and no MRGs Extremities: no redness or tenderness in the calves or thighs, no edema Skin: no lesions Psych: appropriate  Labs:  UA- 2+ Hgb, 0-5  WBC, neg nitrite/leuk/ketone  Radiology CLINICAL DATA: Acute onset of right flank discomfort. Initial encounter.  EXAM: RENAL / URINARY TRACT ULTRASOUND COMPLETE  COMPARISON: Renal ultrasound performed 06/04/2015  FINDINGS: Right Kidney:  Length: 12.0 cm. Moderate right-sided hydronephrosis is noted. An 8 mm nonobstructing stone is noted at the lower pole of the right kidney.  Left Kidney:  Length: 11.2 cm. Echogenicity within normal limits. No mass or hydronephrosis visualized.  Bladder:  Decompressed, status post recent voiding.  IMPRESSION: 1. Moderate right-sided hydronephrosis noted, raising concern for distal obstruction. 2. 8 mm nonobstructing stone at the lower pole of the right kidney.   Electronically Signed  By: Garald Balding M.D.  Assessment & Plan:  30 yo at [redacted] weeks EGA with RIGHT nephrolithiasis and hydronephrosis  IVF and Analgesia Urology consultation for management FHTs daily, monitor FWB as needed

## 2016-02-26 DIAGNOSIS — N201 Calculus of ureter: Secondary | ICD-10-CM | POA: Diagnosis not present

## 2016-02-26 DIAGNOSIS — O26833 Pregnancy related renal disease, third trimester: Secondary | ICD-10-CM | POA: Diagnosis not present

## 2016-02-26 MED ORDER — LACTATED RINGERS IV SOLN: INTRAVENOUS | Status: DC

## 2016-02-26 NOTE — Progress Notes (Signed)
Pain improving with pain medication.  FHTs done this shift. Daily NST done. Tolerating regular diet.   Stone sent to micro for evaluation.  Continue to assess for pain or other s/s and symptoms.

## 2016-02-26 NOTE — Progress Notes (Signed)
Benign Gynecology Progress Note    Subjective:  Slept some but reports intermittant pain esp in R Flank.  Felt worse when IV were changes to saline lock, better when rate restarted.  Feels mildly better than yesterday. No nausea.   Objective:   Temp:  [97.7 F (36.5 C)-98.3 F (36.8 C)] 98 F (36.7 C) (07/05 0614) Pulse Rate:  [64-77] 77 (07/05 0614) Resp:  [16-20] 16 (07/05 0614) BP: (107-117)/(60-77) 107/61 mmHg (07/05 0614) SpO2:  [97 %-98 %] 98 % (07/05 0614) Weight:  [72.576 kg (160 lb)] 72.576 kg (160 lb) (07/04 0844) I/O last 3 completed shifts: In: 270 [P.O.:120; I.V.:150] Out: 850 [Urine:850] Total I/O In: -  Out: 700 [Urine:700]  Physical exam: General appearance: alert, cooperative and appears stated age Abdomen: gravid and soft, non-tender; bowel sounds normal; no masses, no organomegaly  Back: no CVAT, RIGHT flank T GU: No gross VB Extremities: no redness or tenderness in the calves or thighs, no edema Psych: appropriate FHTs: 140s  Labs yesterday:  UA- 2+ Hgb, 0-5 WBC, neg nitrite/leuk/ketone  Radiology Yesterday: RENAL / URINARY TRACT ULTRASOUND   COMPARISON: Renal ultrasound performed 06/04/2015  FINDINGS: Right Kidney:  Length: 12.0 cm. Moderate right-sided hydronephrosis is noted. An 8 mm nonobstructing stone is noted at the lower pole of the right kidney.  Left Kidney:  Length: 11.2 cm. Echogenicity within normal limits. No mass or hydronephrosis visualized.  Bladder:  Decompressed, status post recent voiding.  IMPRESSION: 1. Moderate right-sided hydronephrosis noted, raising concern for distal obstruction. 2. 8 mm nonobstructing stone at the lower pole of the right kidney.  Electronically Signed  By: Garald Balding M.D.  Assessment & Plan:  30 yo at [redacted] weeks EGA with RIGHT nephrolithiasis and hydronephrosis  Cont IVF and Analgesia Urology consultation for management- started Flomax and assessing for passage of stone,  vs procedure or CT assessment    See note per urology    Will re-assess today as closing in on 24 hours FHTs daily, NST today, monitor FWB as needed    OK for CT procedure if needed from an OB perspective

## 2016-02-26 NOTE — Progress Notes (Signed)
Urology Consult Follow Up  Subjective: Feeling better with only mild discomfort, no severe pain since ~12 pm today.  Passes small ~3 mm stone at 3 pm today.  Voiding very frequently.  No n/v.  No fevers, chills.    Anti-infectives: Anti-infectives    None      Current Facility-Administered Medications  Medication Dose Route Frequency Provider Last Rate Last Dose  . acetaminophen (TYLENOL) tablet 650 mg  650 mg Oral Q4H PRN Gae Dry, MD      . alum & mag hydroxide-simeth (MAALOX/MYLANTA) 200-200-20 MG/5ML suspension 30 mL  30 mL Oral Q4H PRN Gae Dry, MD      . lactated ringers infusion   Intravenous Continuous Gae Dry, MD 75 mL/hr at 02/26/16 1417    . morphine 2 MG/ML injection 1-2 mg  1-2 mg Intravenous Q3H PRN Gae Dry, MD   2 mg at 02/25/16 1226  . ondansetron (ZOFRAN) tablet 4 mg  4 mg Oral Q6H PRN Gae Dry, MD       Or  . ondansetron [facility]) injection 4 mg  4 mg Intravenous Q6H PRN Gae Dry, MD   4 mg at 02/25/16 0906  . oxyCODONE (Oxy IR/ROXICODONE) immediate release tablet 5 mg  5 mg Oral Q4H PRN Gae Dry, MD   5 mg at 02/26/16 1636  . prenatal multivitamin tablet 1 tablet  1 tablet Oral Q1200 Gae Dry, MD   1 tablet at 02/26/16 1133  . tamsulosin (FLOMAX) capsule 0.4 mg  0.4 mg Oral Daily Franchot Gallo, MD   0.4 mg at 02/26/16 1014     Objective: Vital signs in last 24 hours: Temp:  [98 F (36.7 C)-98.5 F (36.9 C)] 98.5 F (36.9 C) (07/05 1726) Pulse Rate:  [62-77] 65 (07/05 1726) Resp:  [16-20] 18 (07/05 1726) BP: (107-117)/(57-77) 117/60 mmHg (07/05 1726) SpO2:  [97 %-98 %] 98 % (07/05 0728)  Intake/Output from previous day: 07/04 0701 - 07/05 0700 In: 270 [P.O.:120; I.V.:150] Out: 1550 [Urine:1550] Intake/Output this shift: Total I/O In: 240 [P.O.:240] Out: 500 [Urine:500]   Physical Exam  Constitutional: She is oriented to person, place, and time and well-developed, well-nourished, and in no  distress.  HENT:  Head: Normocephalic and atraumatic.  Neck: Normal range of motion. Neck supple.  Pulmonary/Chest: Effort normal.  Abdominal:  Gravid ureterus  Neurological: She is oriented to person, place, and time.  Skin: Skin is warm and dry.  Psychiatric: Mood, affect and judgment normal.    Lab Results:  No labs  Studies/Results: US Renal  02/25/2016  CLINICAL DATA:  Acute onset of right flank discomfort. Initial encounter. EXAM: RENAL / URINARY TRACT ULTRASOUND COMPLETE COMPARISON:  Renal ultrasound performed 06/04/2015 FINDINGS: Right Kidney: Length: 12.0 cm. Moderate right-sided hydronephrosis is noted. An 8 mm nonobstructing stone is noted at the lower pole of the right kidney. Left Kidney: Length: 11.2 cm. Echogenicity within normal limits. No mass or hydronephrosis visualized. Bladder: Decompressed, status post recent voiding. IMPRESSION: 1. Moderate right-sided hydronephrosis noted, raising concern for distal obstruction. 2. 8 mm nonobstructing stone at the lower pole of the right kidney. Electronically Signed   By: Garald Balding M.D.   On: 02/25/2016 05:35     Assessment: 30 yo F with probable right ureteral stone, likely passed.  Plan: -stone sent for stone analysis - if clinically well, OK for d/c -if continues to have right flank pain episodes, consider repeat RUS to assess for improvement of hydronephrosis  -  continue Flomax while in house -pt advised to f/u after childbirth to address nonobstructing stone -contact urology ASAP if develops fever, chills, decompensation  We will sign off, please contact us with any questions or concerns    LOS: 1 day    [name] 02/26/2016

## 2016-02-27 DIAGNOSIS — O26833 Pregnancy related renal disease, third trimester: Secondary | ICD-10-CM | POA: Diagnosis not present

## 2016-02-27 MED ORDER — OXYCODONE HCL 5 MG PO TABS: 5.0000 mg | ORAL_TABLET | ORAL | Status: DC | PRN

## 2016-02-27 MED ORDER — ACETAMINOPHEN 325 MG PO TABS: 650.0000 mg | ORAL_TABLET | ORAL | Status: DC | PRN

## 2016-02-27 MED ORDER — TAMSULOSIN HCL 0.4 MG PO CAPS: 0.4000 mg | ORAL_CAPSULE | Freq: Every day | ORAL | Status: DC

## 2016-02-27 MED ORDER — TETANUS-DIPHTH-ACELL PERTUSSIS 5-2.5-18.5 LF-MCG/0.5 IM SUSP
0.5000 mL | Freq: Once | INTRAMUSCULAR | Status: DC
Start: 2016-02-27 — End: 2016-03-10

## 2016-02-27 NOTE — Progress Notes (Signed)
Pt discharged home.  Discharge instructions, prescriptions and follow up appointment given to and reviewed with pt.  Pt verbalized understanding.  Escorted by auxillary. 

## 2016-02-27 NOTE — Discharge Summary (Signed)
Antenatal Physician Discharge Summary  Patient ID: Jennifer Richmond MRN: [id number] DOB/AGE: 05/07/1986 30 y.o.  Admit date: 02/25/2016 Discharge date: 02/27/2016  Admission Diagnoses: abdominal/flank pain, [redacted] weeks pregnant  Discharge Diagnoses: kidney stone, [redacted] weeks pregnant  Prenatal Procedures: ultrasound of renal system  Consults: Urology  Significant Diagnostic Studies:  Results for orders placed or performed during the hospital encounter of 02/25/16 (from the past 168 hour(s))  Urinalysis complete, with microscopic ([place] only)   Collection Time: 02/25/16  2:03 AM  Result Value Ref Range   Color, Urine YELLOW (A) YELLOW   APPearance HAZY (A) CLEAR   Glucose, UA NEGATIVE NEGATIVE mg/dL   Bilirubin Urine NEGATIVE NEGATIVE   Ketones, ur NEGATIVE NEGATIVE mg/dL   Specific Gravity, Urine 1.014 1.005 - 1.030   Hgb urine dipstick 2+ (A) NEGATIVE   pH 6.0 5.0 - 8.0   Protein, ur NEGATIVE NEGATIVE mg/dL   Nitrite NEGATIVE NEGATIVE   Leukocytes, UA NEGATIVE NEGATIVE   RBC / HPF 6-30 0 - 5 RBC/hpf   WBC, UA 0-5 0 - 5 WBC/hpf   Bacteria, UA RARE (A) NONE SEEN   Squamous Epithelial / LPF 6-30 (A) NONE SEEN   Mucous PRESENT     Treatments: pain control, Flomax  Hospital Course:  This is a 30 y.o. [id number] with IUP at [redacted]w[redacted]d admitted for pain and found to have a non-obstructing 25mm kidney stone, and passed a smaller 2mm stone. She was given PO meds for pain control.  Urology was consulted and offered suggestions for her care going forward, with post-partum evaluation for her non-obstructing stone.  She was deemed stable for discharge to home with outpatient follow up.  Discharge Physical Exam:  BP 109/59 mmHg  Pulse 60  Temp(Src) 98.5 F (36.9 C) (Oral)  Resp 20  Ht 5\' 5"  (1.651 m)  Wt 72.576 kg (160 lb)  BMI 26.63 kg/m2  SpO2 97%  LMP 06/25/2015 (Exact Date)  General: NAD CV: RRR Pulm: CTABL, nl effort ABD: s/nd/nt, gravid BACK: +cva ttp - right DVT Evaluation: LE  non-ttp, no evidence of DVT on exam.   Discharge Condition: Stable  Disposition: 01-Home or Self Care      Discharge Instructions    Fetal Kick Count:  Lie on our left side for one hour after a meal, and count the number of times your baby kicks.  If it is less than 5 times, get up, move around and drink some juice.  Repeat the test 30 minutes later.  If it is still less than 5 kicks in an hour, notify your doctor.    Complete by:  As directed      Tdap vaccine greater than or equal to 7yo IM    Complete by:  As directed             Medication List    STOP taking these medications        metroNIDAZOLE 500 MG tablet  Commonly known as:  FLAGYL     traMADol 50 MG tablet  Commonly known as:  ULTRAM     TRAZODONE & DIET MANAGE PROD PO      TAKE these medications        acetaminophen 325 MG tablet  Commonly known as:  TYLENOL  Take 2 tablets (650 mg total) by mouth every 4 (four) hours as needed for mild pain.     clonazePAM 1 MG tablet  Commonly known as:  KLONOPIN  Take 1 mg by mouth 2 (two)  times daily.     nitrofurantoin (macrocrystal-monohydrate) 100 MG capsule  Commonly known as:  MACROBID  Take 1 capsule (100 mg total) by mouth daily.     ondansetron 4 MG tablet  Commonly known as:  ZOFRAN  Take 1 tablet (4 mg total) by mouth every 6 (six) hours as needed for nausea or vomiting.     oxyCODONE 5 MG immediate release tablet  Commonly known as:  Oxy IR/ROXICODONE  Take 1 tablet (5 mg total) by mouth every 4 (four) hours as needed for moderate pain.     tamsulosin 0.4 MG Caps capsule  Commonly known as:  FLOMAX  Take 1 capsule (0.4 mg total) by mouth daily.     Tdap 5-2.5-18.5 LF-MCG/0.5 injection  Commonly known as:  BOOSTRIX  Inject 0.5 mLs into the muscle once.       Follow-up Information    Follow up with Honor Loh Ward, MD In 2 weeks.   Specialty:  Obstetrics and Gynecology   Why:  for routine prenatal visit - may see any provider   Contact  information:   [place] [address] [phone]       Follow up with [name], MD.   Specialty:  Urology   Why:  As needed   Contact information:   [place] [address] [phone]       Signed: ----- Larey Days, MD Attending Obstetrician and Gynecologist [facility]

## 2016-02-27 NOTE — Discharge Instructions (Signed)
If your pain increases, we will order another kidney ultrasound.  Please notify [facility] of this when or if this happens.  If you develop a fever or intractable pain, please immediately notify Dr. Erlene Quan at [facility] - her number is on your discharge summary.  Keep Tylenol as your baseline medication for pain control, and take the prescribed narcotics only when necessary.  Our goal is to minimize exposure to the fetus - but if you need it for severe pain control, please take it.  Please remember, if you are still taking narcotics at time of delivery, your baby may need to be observed in the nursery for a few days to make sure there is no withdrawal symptoms or concerns.  This is precautionary, and for you and the baby's safety.

## 2016-03-05 ENCOUNTER — Encounter: Payer: Self-pay | Admitting: *Deleted

## 2016-03-05 ENCOUNTER — Observation Stay
Admission: EM | Admit: 2016-03-05 | Discharge: 2016-03-05 | Disposition: A | Discharge status: Home / Self Care | Payer: Medicaid Other | Attending: Certified Nurse Midwife | Admitting: Certified Nurse Midwife

## 2016-03-05 DIAGNOSIS — R109 Unspecified abdominal pain: Secondary | ICD-10-CM | POA: Diagnosis present

## 2016-03-05 DIAGNOSIS — Z3A32 32 weeks gestation of pregnancy: Secondary | ICD-10-CM | POA: Insufficient documentation

## 2016-03-05 DIAGNOSIS — O26833 Pregnancy related renal disease, third trimester: Secondary | ICD-10-CM | POA: Diagnosis not present

## 2016-03-05 DIAGNOSIS — N200 Calculus of kidney: Secondary | ICD-10-CM | POA: Diagnosis not present

## 2016-03-05 DIAGNOSIS — O9989 Other specified diseases and conditions complicating pregnancy, childbirth and the puerperium: Secondary | ICD-10-CM | POA: Diagnosis present

## 2016-03-05 LAB — URINALYSIS COMPLETE WITH MICROSCOPIC (ARMC ONLY)
BACTERIA UA: NONE SEEN
BILIRUBIN URINE: NEGATIVE
Glucose, UA: NEGATIVE mg/dL
Hgb urine dipstick: NEGATIVE
KETONES UR: NEGATIVE mg/dL
Leukocytes, UA: NEGATIVE
Nitrite: NEGATIVE
PH: 6 (ref 5.0–8.0)
Protein, ur: NEGATIVE mg/dL
SPECIFIC GRAVITY, URINE: 1.014 (ref 1.005–1.030)

## 2016-03-05 MED ORDER — OXYCODONE HCL 5 MG PO TABS
5.0000 mg | ORAL_TABLET | ORAL | Status: DC | PRN
  Administered 2016-03-05: 5 mg via ORAL
  Filled 2016-03-05: qty 1

## 2016-03-05 MED ORDER — ONDANSETRON 4 MG PO TBDP
4.0000 mg | ORAL_TABLET | Freq: Four times a day (QID) | ORAL | Status: DC | PRN
  Administered 2016-03-05: 4 mg via ORAL
  Filled 2016-03-05: qty 1

## 2016-03-05 NOTE — Discharge Instructions (Signed)
Come back if:  You feel a big gush of fluid Fever over 100.4 Contractions every 3-5 min lasting at least one hour Heavy vaginal bleeding Decreased fetal movement   Get plenty of rest and drink plenty on fluids!

## 2016-03-05 NOTE — Final Progress Note (Signed)
Physician Final Progress Note  Patient ID: Jennifer Richmond MRN: [id number] DOB/AGE: 04/18/1986 30 y.o.  Admit date: 03/05/2016 Admitting provider: Gae Dry, MD Discharge date: 03/05/2016   Admission Diagnoses: Flank pain IUP at 32.2 weeks with known kidney stones  Discharge Diagnoses:  Same  Consults:none  Significant Findings/ Diagnostic Studies: 30 yo G4 P1021 with EDC= 04/28/2016 presented this AM with complaints of continued right flank pain since discharge from [facility] 02/27/2016. Was admitted with similar pain and passed a kidney stone while in the hospital. Had a kidney ultrasound at that time revealing a nonobstructing 8 mm stone in the right kidney. Urology was consulted and she was sent home on roxicodone 5 mgm and Flomax. She ran out of roxicodone and Tylenol was not relieving her right back pain and her right lower quadrant pain. The pain is worse when she moves. Baby is active and the pain in her RLQ is worsened by FM also. Denies gross hematuria, fever, chills. Urinating frequently, small amts, and does not feel like she is emptying her bladder at time over the last 2 days. Vomited three times yesterday, but able to keep down fluids. Prenatal care at [facility] also remarkable for asthma, HSV II, anxiety/bipolar, and mild anemia.  Exam: BP 122/72 mmHg  Pulse 75  Temp(Src) 98 F (36.7 C) (Oral)  Resp 16  Ht 5\' 5"  (1.651 m)  Wt 73.029 kg (161 lb)  BMI 26.79 kg/m2  LMP 06/25/2015 (Exact Date)  General: affect appropriate, looks uncomfortable, tired Abdomen: uterus NT, except for the right uterine border-ttp over round ligament FHR: 145 with acclerations to 170s, moderate variability Toco: occasional contraction  Results for orders placed or performed during the hospital encounter of 03/05/16 (from the past 24 hour(s))  Urinalysis complete, with microscopic ([facility] only)     Status: Abnormal   Collection Time: 03/05/16  1:25 AM  Result Value Ref Range   Color, Urine YELLOW (A)  YELLOW   APPearance CLOUDY (A) CLEAR   Glucose, UA NEGATIVE NEGATIVE mg/dL   Bilirubin Urine NEGATIVE NEGATIVE   Ketones, ur NEGATIVE NEGATIVE mg/dL   Specific Gravity, Urine 1.014 1.005 - 1.030   Hgb urine dipstick NEGATIVE NEGATIVE   pH 6.0 5.0 - 8.0   Protein, ur NEGATIVE NEGATIVE mg/dL   Nitrite NEGATIVE NEGATIVE   Leukocytes, UA NEGATIVE NEGATIVE   RBC / HPF 0-5 0 - 5 RBC/hpf   WBC, UA 0-5 0 - 5 WBC/hpf   Bacteria, UA NONE SEEN NONE SEEN   Squamous Epithelial / LPF 0-5 (A) NONE SEEN   Mucous PRESENT    Ca Oxalate Crys, UA PRESENT   A: IUP at 32.2 weeks with flank pain probably due to urolithiasis, but there may be a component of right round ligament pain. Urinalysis not indicative of UTI.  P: Given 4 mgm Zofran ODT followed by 5 mgm roxicodone which helped to decrease pain. Patient desired to be discharged and was sent home with RX for #30 roxicodone. Has appt next week for follow up with urology and [facility].  Procedures: none  Discharge Condition: stable  Disposition: 01-Home or Self Care  Diet: Regular diet  Discharge Activity: Activity as tolerated      Total time spent taking care of this patient: 15 minutes  Signed: Chucky Homes 03/05/2016, 3:55 AM

## 2016-03-05 NOTE — OB Triage Note (Signed)
Recvd pt from ED. C/o abdominal pain and states that she was here last week and passed a kidney stone. States that pain feels the same but ran out of pain medicine. States she has not had intercourse in past 24 hours and no vaginal bleeding. Feeling baby move ok.

## 2016-03-09 LAB — STONE ANALYSIS
CA PHOS CRY STONE QL IR: 40 %
Ca Oxalate,Dihydrate: 20 %
Ca Oxalate,Monohydr.: 40 %
Stone Weight KSTONE: 3 mg

## 2016-03-10 ENCOUNTER — Ambulatory Visit (INDEPENDENT_AMBULATORY_CARE_PROVIDER_SITE_OTHER): Payer: Medicaid Other | Admitting: Urology

## 2016-03-10 ENCOUNTER — Observation Stay
Admission: EM | Admit: 2016-03-10 | Discharge: 2016-03-11 | Disposition: A | Discharge status: Home / Self Care | Payer: Medicaid Other | Attending: Obstetrics & Gynecology | Admitting: Obstetrics & Gynecology

## 2016-03-10 ENCOUNTER — Encounter: Payer: Self-pay | Admitting: Urology

## 2016-03-10 VITALS — BP 103/69 | HR 97 | Ht 65.0 in | Wt 162.0 lb

## 2016-03-10 DIAGNOSIS — Z3A33 33 weeks gestation of pregnancy: Secondary | ICD-10-CM | POA: Insufficient documentation

## 2016-03-10 DIAGNOSIS — R350 Frequency of micturition: Secondary | ICD-10-CM

## 2016-03-10 DIAGNOSIS — Z820 Family history of epilepsy and other diseases of the nervous system: Secondary | ICD-10-CM | POA: Insufficient documentation

## 2016-03-10 DIAGNOSIS — Z79899 Other long term (current) drug therapy: Secondary | ICD-10-CM | POA: Insufficient documentation

## 2016-03-10 DIAGNOSIS — R109 Unspecified abdominal pain: Secondary | ICD-10-CM | POA: Diagnosis present

## 2016-03-10 DIAGNOSIS — O219 Vomiting of pregnancy, unspecified: Secondary | ICD-10-CM | POA: Diagnosis present

## 2016-03-10 DIAGNOSIS — O26833 Pregnancy related renal disease, third trimester: Secondary | ICD-10-CM | POA: Diagnosis not present

## 2016-03-10 DIAGNOSIS — N200 Calculus of kidney: Secondary | ICD-10-CM

## 2016-03-10 DIAGNOSIS — F4310 Post-traumatic stress disorder, unspecified: Secondary | ICD-10-CM | POA: Insufficient documentation

## 2016-03-10 DIAGNOSIS — N132 Hydronephrosis with renal and ureteral calculous obstruction: Secondary | ICD-10-CM | POA: Insufficient documentation

## 2016-03-10 DIAGNOSIS — N1330 Unspecified hydronephrosis: Secondary | ICD-10-CM | POA: Diagnosis not present

## 2016-03-10 DIAGNOSIS — J45909 Unspecified asthma, uncomplicated: Secondary | ICD-10-CM | POA: Insufficient documentation

## 2016-03-10 DIAGNOSIS — O99613 Diseases of the digestive system complicating pregnancy, third trimester: Secondary | ICD-10-CM | POA: Insufficient documentation

## 2016-03-10 DIAGNOSIS — Z87891 Personal history of nicotine dependence: Secondary | ICD-10-CM | POA: Insufficient documentation

## 2016-03-10 DIAGNOSIS — O99513 Diseases of the respiratory system complicating pregnancy, third trimester: Secondary | ICD-10-CM | POA: Insufficient documentation

## 2016-03-10 DIAGNOSIS — O99343 Other mental disorders complicating pregnancy, third trimester: Secondary | ICD-10-CM | POA: Insufficient documentation

## 2016-03-10 DIAGNOSIS — F419 Anxiety disorder, unspecified: Secondary | ICD-10-CM | POA: Insufficient documentation

## 2016-03-10 DIAGNOSIS — Z87442 Personal history of urinary calculi: Secondary | ICD-10-CM | POA: Insufficient documentation

## 2016-03-10 DIAGNOSIS — Z91013 Allergy to seafood: Secondary | ICD-10-CM | POA: Insufficient documentation

## 2016-03-10 DIAGNOSIS — Z841 Family history of disorders of kidney and ureter: Secondary | ICD-10-CM | POA: Insufficient documentation

## 2016-03-10 DIAGNOSIS — K219 Gastro-esophageal reflux disease without esophagitis: Secondary | ICD-10-CM | POA: Insufficient documentation

## 2016-03-10 DIAGNOSIS — Z833 Family history of diabetes mellitus: Secondary | ICD-10-CM | POA: Insufficient documentation

## 2016-03-10 DIAGNOSIS — O212 Late vomiting of pregnancy: Secondary | ICD-10-CM | POA: Insufficient documentation

## 2016-03-10 DIAGNOSIS — F319 Bipolar disorder, unspecified: Secondary | ICD-10-CM | POA: Insufficient documentation

## 2016-03-10 LAB — MICROSCOPIC EXAMINATION: Epithelial Cells (non renal): >10 /hpf — AB (ref 0–10)

## 2016-03-10 LAB — URINALYSIS, COMPLETE
Bilirubin, UA: NEGATIVE
GLUCOSE, UA: NEGATIVE
KETONES UA: NEGATIVE
Leukocytes, UA: NEGATIVE
NITRITE UA: NEGATIVE
SPEC GRAV UA: 1.025 (ref 1.005–1.030)
Urobilinogen, Ur: 0.2 mg/dL (ref 0.2–1.0)
pH, UA: 5 (ref 5.0–7.5)

## 2016-03-10 MED ORDER — TAMSULOSIN HCL 0.4 MG PO CAPS: 0.4000 mg | ORAL_CAPSULE | Freq: Two times a day (BID) | ORAL | Status: DC

## 2016-03-10 MED ORDER — ONDANSETRON HCL 4 MG PO TABS: 4.0000 mg | ORAL_TABLET | Freq: Three times a day (TID) | ORAL | Status: DC | PRN

## 2016-03-10 MED ORDER — OXYCODONE HCL 5 MG PO TABS: 5.0000 mg | ORAL_TABLET | ORAL | Status: DC | PRN

## 2016-03-10 NOTE — Addendum Note (Signed)
Addended by: Tommy Rainwater on: 03/10/2016 04:23 PM   Modules accepted: Orders

## 2016-03-10 NOTE — Progress Notes (Signed)
03/10/2016 10:48 AM   Jennifer Richmond 03-Dec-1985 [id number]  Referring provider: Kathrine Haddock, NP [place] [place] [place] [address]  Chief Complaint  Patient presents with  . Nephrolithiasis    HPI: 30 year old female currently 33/[redacted] weeks pregnant who returns to clinic today for follow-up from recent admission for kidney stones.  She was admitted to the hospital on 02/25/2016 with acute onset right flank pain. Renal ultrasound confirmed the presence of right hydronephrosis as well as a 8 mm right lower pole stone.    She passed the stone during that admission with Flomax and IV fluids. She developed flank pain again 2 days later and passed another stone. She was feeling fairly well until about 3 days ago when her flank pain recurred.   She's had associated nausea without vomiting. No fevers or chills. She does have urinary symptoms including urinary frequency, urgency and some mild dysuria.  She has been taking narcotics in the form of Percocet as needed as well as Flomax. She was not given any antinausea medication.  She is due 04/28/2016.  She is a patient of Jennifer Richmond. She seen multiple doctors. She is unsure who she saw last.  She has not been having any painful contractions or evidence of preterm labor.  She does have a personal history of kidney stones or recurrent urinary tract infections.  UA today shows no evidence of infection. She does have cough calcium oxalate crystals present in her urine today.   PMH: Past Medical History  Diagnosis Date  . Kidney stones   . Panic attacks   . Abdominal pain   . Anxiety   . Back pain   . GERD (gastroesophageal reflux disease)   . Bipolar disorder ([place])   . Asthma   . PTSD (post-traumatic stress disorder)   . UTI (urinary tract infection)   . Urinary hesitancy   . Gross hematuria     Surgical History: Past Surgical History  Procedure Laterality Date  . Dilation and curettage of uterus    . Ovarian resection       Home Medications:    Medication List       This list is accurate as of: 03/10/16 10:48 AM.  Always use your most recent med list.               acetaminophen 325 MG tablet  Commonly known as:  TYLENOL  Take 2 tablets (650 mg total) by mouth every 4 (four) hours as needed for mild pain.     ondansetron 4 MG tablet  Commonly known as:  ZOFRAN  Take 1 tablet (4 mg total) by mouth every 8 (eight) hours as needed for nausea or vomiting.     oxyCODONE 5 MG immediate release tablet  Commonly known as:  Oxy IR/ROXICODONE  Take 1 tablet (5 mg total) by mouth every 4 (four) hours as needed for moderate pain.     prenatal multivitamin Tabs tablet  Take 1 tablet by mouth daily at 12 noon.     tamsulosin 0.4 MG Caps capsule  Commonly known as:  FLOMAX  Take 1 capsule (0.4 mg total) by mouth 2 (two) times daily.        Allergies:  Allergies  Allergen Reactions  . Shrimp [Shellfish Allergy]     Family History: Family History  Problem Relation Age of Onset  . Diabetes Father   . Kidney nephrosis Father   . Seizures Mother     Social History:  reports that she  has quit smoking. Her smoking use included Cigarettes. She does not have any smokeless tobacco history on file. She reports that she does not drink alcohol or use illicit drugs.  ROS: UROLOGY Frequent Urination?: Yes Hard to postpone urination?: Yes Burning/pain with urination?: Yes Get up at night to urinate?: Yes Leakage of urine?: No Urine stream starts and stops?: No Trouble starting stream?: No Do you have to strain to urinate?: No Blood in urine?: Yes Urinary tract infection?: No Sexually transmitted disease?: No Injury to kidneys or bladder?: No Painful intercourse?: No Weak stream?: No Currently pregnant?: Yes Vaginal bleeding?: No Last menstrual period?: n  Gastrointestinal Nausea?: Yes Vomiting?: Yes Indigestion/heartburn?: No Diarrhea?: No Constipation?: No  Constitutional Fever:  No Night sweats?: No Weight loss?: No Fatigue?: No  Skin Skin rash/lesions?: No Itching?: No  Eyes Blurred vision?: No Double vision?: No  Ears/Nose/Throat Sore throat?: No Sinus problems?: No  Hematologic/Lymphatic Swollen glands?: No Easy bruising?: No  Cardiovascular Leg swelling?: No Chest pain?: No  Respiratory Cough?: No Shortness of breath?: No  Endocrine Excessive thirst?: No  Musculoskeletal Back pain?: Yes Joint pain?: No  Neurological Headaches?: No Dizziness?: No  Psychologic Depression?: No Anxiety?: No  Physical Exam: BP 103/69 mmHg  Pulse 97  Ht 5\' 5"  (1.651 m)  Wt 162 lb (73.483 kg)  BMI 26.96 kg/m2  LMP 06/25/2015 (Exact Date)  Constitutional:  Alert and oriented, No acute distress. HEENT: [place] AT, moist mucus membranes.  Trachea midline, no masses. Cardiovascular: No clubbing, cyanosis, or edema. Respiratory: Normal respiratory effort, no increased work of breathing. GI: Abdomen consistent Gravid uterus.  Mild suprapubic tenderness.   GU: Mild right CVA tenderness. Skin: No rashes, bruises or suspicious lesions. Lymph: No cervical or inguinal adenopathy. Neurologic: Grossly intact, no focal deficits, moving all 4 extremities. Psychiatric: Normal mood and affect.  Laboratory Data: Lab Results  Component Value Date   WBC 13.5* 09/04/2015   HGB 13.9 09/04/2015   HCT 41.4 09/04/2015   MCV 91.6 09/04/2015   PLT 203 09/04/2015    Lab Results  Component Value Date   CREATININE 0.82 09/04/2015    Urinalysis UA today reviewed, 3-10 red blood cells, no white blood cells, calcium oxalate crystals present. Trace protein. Otherwise negative dip.  Pertinent Imaging: Study Result     CLINICAL DATA: Acute onset of right flank discomfort. Initial encounter.  EXAM: RENAL / URINARY TRACT ULTRASOUND COMPLETE  COMPARISON: Renal ultrasound performed 06/04/2015  FINDINGS: Right Kidney:  Length: 12.0 cm. Moderate right-sided  hydronephrosis is noted. An 8 mm nonobstructing stone is noted at the lower pole of the right kidney.  Left Kidney:  Length: 11.2 cm. Echogenicity within normal limits. No mass or hydronephrosis visualized.  Bladder:  Decompressed, status post recent voiding.  IMPRESSION: 1. Moderate right-sided hydronephrosis noted, raising concern for distal obstruction. 2. 8 mm nonobstructing stone at the lower pole of the right kidney.   Electronically Signed  By: Garald Balding M.D.  On: 02/25/2016 05:35    Ultrasound was reviewed again today by me personally  Assessment & Plan:   . 1. Pregnancy complicated by nephrolithiasis, antepartum, third trimester We had a lengthy discussion today regarding how to move forward with her recurrent kidney stones in the setting of pregnancy, third trimester.   Options including further imaging in the form of repeat renal ultrasound versus low-dose noncontrast CT scan were discussed. Additionally, interventions including ureteral stent placement and percutaneous nephrostomy tube were discussed at length today.  Risk and benefits of each  were discussed. She does have significant urinary complaints including urinary urgency and frequency and I suspect a stent would exacerbate those issues. I do think that she would do well with the nephrostomy tube which would help relieve her pain. Both have a relatively small risk of fetal compromise/preterm labor.  No evidence of infection or need for urgent intervention.  We also discussed the option of continued pain control, Flomax, and expected management as long as she doesn't have any fevers, chills, or urinary tract infections. We reviewed the warning symptoms in detail today.  She preferred to defer intervention at this time. Pain medication was refilled along with Flomax today.  We will go ahead and arrange for CT abdomen and pelvis without contrast just after delivery and have her return shortly  thereafter to discuss management of her stones.  If she fails medical expulsive therapy, will arrange for right nephrostomy tube placement.  Case was discussed with Dr. Glennon Mac at [place] today.  - Urinalysis, Complete - ondansetron (ZOFRAN) 4 MG tablet; Take 1 tablet (4 mg total) by mouth every 8 (eight) hours as needed for nausea or vomiting.  Dispense: 20 tablet; Refill: 0 - CT RENAL STONE STUDY; Future  2. Flank pain As above - tamsulosin (FLOMAX) 0.4 MG CAPS capsule; Take 1 capsule (0.4 mg total) by mouth 2 (two) times daily.  Dispense: 60 capsule; Refill: 0 - oxyCODONE (OXY IR/ROXICODONE) 5 MG immediate release tablet; Take 1 tablet (5 mg total) by mouth every 4 (four) hours as needed for moderate pain.  Dispense: 15 tablet; Refill: 0  3. Hydronephrosis, right Secondary to obstructing stone  4. Urinary frequency UA today not particularly suspicious for infection, will send culture to rule out infection although unlikely - CULTURE, URINE COMPREHENSIVE   Return in about 8 weeks (around 05/05/2016).  [name], MD  [facility] [address] [place], [place] [address] [phone]

## 2016-03-11 ENCOUNTER — Encounter: Payer: Self-pay | Admitting: *Deleted

## 2016-03-11 DIAGNOSIS — O99343 Other mental disorders complicating pregnancy, third trimester: Secondary | ICD-10-CM | POA: Diagnosis not present

## 2016-03-11 DIAGNOSIS — O212 Late vomiting of pregnancy: Secondary | ICD-10-CM | POA: Diagnosis not present

## 2016-03-11 DIAGNOSIS — J45909 Unspecified asthma, uncomplicated: Secondary | ICD-10-CM | POA: Diagnosis not present

## 2016-03-11 DIAGNOSIS — Z841 Family history of disorders of kidney and ureter: Secondary | ICD-10-CM | POA: Diagnosis not present

## 2016-03-11 DIAGNOSIS — Z87891 Personal history of nicotine dependence: Secondary | ICD-10-CM | POA: Diagnosis not present

## 2016-03-11 DIAGNOSIS — F4310 Post-traumatic stress disorder, unspecified: Secondary | ICD-10-CM | POA: Diagnosis not present

## 2016-03-11 DIAGNOSIS — O99613 Diseases of the digestive system complicating pregnancy, third trimester: Secondary | ICD-10-CM | POA: Diagnosis not present

## 2016-03-11 DIAGNOSIS — O99513 Diseases of the respiratory system complicating pregnancy, third trimester: Secondary | ICD-10-CM | POA: Diagnosis not present

## 2016-03-11 DIAGNOSIS — Z820 Family history of epilepsy and other diseases of the nervous system: Secondary | ICD-10-CM | POA: Diagnosis not present

## 2016-03-11 DIAGNOSIS — Z3A33 33 weeks gestation of pregnancy: Secondary | ICD-10-CM | POA: Diagnosis not present

## 2016-03-11 DIAGNOSIS — N200 Calculus of kidney: Secondary | ICD-10-CM | POA: Diagnosis present

## 2016-03-11 DIAGNOSIS — Z833 Family history of diabetes mellitus: Secondary | ICD-10-CM | POA: Diagnosis not present

## 2016-03-11 DIAGNOSIS — O26833 Pregnancy related renal disease, third trimester: Secondary | ICD-10-CM | POA: Diagnosis not present

## 2016-03-11 DIAGNOSIS — O219 Vomiting of pregnancy, unspecified: Secondary | ICD-10-CM | POA: Diagnosis present

## 2016-03-11 DIAGNOSIS — K219 Gastro-esophageal reflux disease without esophagitis: Secondary | ICD-10-CM | POA: Diagnosis not present

## 2016-03-11 DIAGNOSIS — N132 Hydronephrosis with renal and ureteral calculous obstruction: Secondary | ICD-10-CM | POA: Diagnosis not present

## 2016-03-11 DIAGNOSIS — Z79899 Other long term (current) drug therapy: Secondary | ICD-10-CM | POA: Diagnosis not present

## 2016-03-11 DIAGNOSIS — Z91013 Allergy to seafood: Secondary | ICD-10-CM | POA: Diagnosis not present

## 2016-03-11 DIAGNOSIS — Z87442 Personal history of urinary calculi: Secondary | ICD-10-CM | POA: Diagnosis not present

## 2016-03-11 DIAGNOSIS — F419 Anxiety disorder, unspecified: Secondary | ICD-10-CM | POA: Diagnosis not present

## 2016-03-11 DIAGNOSIS — F319 Bipolar disorder, unspecified: Secondary | ICD-10-CM | POA: Diagnosis not present

## 2016-03-11 MED ORDER — DEXTROSE IN LACTATED RINGERS 5 % IV SOLN
INTRAVENOUS | Status: DC
  Administered 2016-03-11 (×2): via INTRAVENOUS

## 2016-03-11 MED ORDER — PROCHLORPERAZINE EDISYLATE 5 MG/ML IJ SOLN
10.0000 mg | INTRAMUSCULAR | Status: DC | PRN
  Administered 2016-03-11 (×2): 10 mg via INTRAVENOUS
  Filled 2016-03-11 (×4): qty 2

## 2016-03-11 MED ORDER — OXYCODONE HCL 5 MG PO TABS: 5.0000 mg | ORAL_TABLET | ORAL | Status: DC | PRN

## 2016-03-11 MED ORDER — ONDANSETRON HCL 4 MG/2ML IJ SOLN
4.0000 mg | Freq: Four times a day (QID) | INTRAMUSCULAR | Status: DC | PRN
  Administered 2016-03-11: 4 mg via INTRAVENOUS
  Filled 2016-03-11: qty 2

## 2016-03-11 MED ORDER — PRENATAL MULTIVITAMIN CH: 1.0000 | ORAL_TABLET | Freq: Every day | ORAL | Status: DC

## 2016-03-11 MED ORDER — ACETAMINOPHEN 325 MG PO TABS
650.0000 mg | ORAL_TABLET | ORAL | Status: DC | PRN
  Administered 2016-03-11 (×2): 650 mg via ORAL
  Filled 2016-03-11 (×2): qty 2

## 2016-03-11 MED ORDER — TAMSULOSIN HCL 0.4 MG PO CAPS
0.4000 mg | ORAL_CAPSULE | Freq: Two times a day (BID) | ORAL | Status: DC
  Administered 2016-03-11: 0.4 mg via ORAL
  Filled 2016-03-11 (×2): qty 1

## 2016-03-11 NOTE — Discharge Instructions (Signed)
Ondansetron oral dissolving tablet What is this medicine? ONDANSETRON (on DAN se tron) is used to treat nausea and vomiting caused by chemotherapy. It is also used to prevent or treat nausea and vomiting after surgery. This medicine may be used for other purposes; ask your health care provider or pharmacist if you have questions. What should I tell my health care provider before I take this medicine? They need to know if you have any of these conditions: -heart disease -history of irregular heartbeat -liver disease -low levels of magnesium or potassium in the blood -an unusual or allergic reaction to ondansetron, granisetron, other medicines, foods, dyes, or preservatives -pregnant or trying to get pregnant -breast-feeding How should I use this medicine? These tablets are made to dissolve in the mouth. Do not try to push the tablet through the foil backing. With dry hands, peel away the foil backing and gently remove the tablet. Place the tablet in the mouth and allow it to dissolve, then swallow. While you may take these tablets with water, it is not necessary to do so. Talk to your pediatrician regarding the use of this medicine in children. Special care may be needed. Overdosage: If you think you have taken too much of this medicine contact a poison control center or emergency room at once. NOTE: This medicine is only for you. Do not share this medicine with others. What if I miss a dose? If you miss a dose, take it as soon as you can. If it is almost time for your next dose, take only that dose. Do not take double or extra doses. What may interact with this medicine? Do not take this medicine with any of the following medications: -apomorphine -certain medicines for fungal infections like fluconazole, itraconazole, ketoconazole, posaconazole, voriconazole -cisapride -dofetilide -dronedarone -pimozide -thioridazine -ziprasidone This medicine may also interact with the following  medications: -carbamazepine -certain medicines for depression, anxiety, or psychotic disturbances -fentanyl -linezolid -MAOIs like Carbex, Eldepryl, Marplan, Nardil, and Parnate -methylene blue (injected into a vein) -other medicines that prolong the QT interval (cause an abnormal heart rhythm) -phenytoin -rifampicin -tramadol This list may not describe all possible interactions. Give your health care provider a list of all the medicines, herbs, non-prescription drugs, or dietary supplements you use. Also tell them if you smoke, drink alcohol, or use illegal drugs. Some items may interact with your medicine. What should I watch for while using this medicine? Check with your doctor or health care professional as soon as you can if you have any sign of an allergic reaction. What side effects may I notice from receiving this medicine? Side effects that you should report to your doctor or health care professional as soon as possible: -allergic reactions like skin rash, itching or hives, swelling of the face, lips, or tongue -breathing problems -confusion -dizziness -fast or irregular heartbeat -feeling faint or lightheaded, falls -fever and chills -loss of balance or coordination -seizures -sweating -swelling of the hands and feet -tightness in the chest -tremors -unusually weak or tired Side effects that usually do not require medical attention (report to your doctor or health care professional if they continue or are bothersome): -constipation or diarrhea -headache This list may not describe all possible side effects. Call your doctor for medical advice about side effects. You may report side effects to FDA at 1-800-FDA-1088. Where should I keep my medicine? Keep out of the reach of children. Store between 2 and 30 degrees C (36 and 86 degrees F). Throw away any unused  medicine after the expiration date. NOTE: This sheet is a summary. It may not cover all possible information. If  you have questions about this medicine, talk to your doctor, pharmacist, or health care provider.    2016, Elsevier/Gold Standard. (2013-05-17 16:21:52) Prenatal Vitamin and Mineral Combinations (oral solid dosage forms) What is this medicine? PRENATAL VITAMIN AND MINERAL combinations are used before, during, and after pregnancy to help provide provide good nutrition. This medicine may be used for other purposes; ask your health care provider or pharmacist if you have questions. What should I tell my health care provider before I take this medicine? They need to know if you have any of these conditions: -bleeding or clotting disorder -history of anemia of any type -other chronic health condition -an unusual or allergic reaction to vitamins, minerals, other medicines, foods, dyes, or preservatives How should I use this medicine? Take this medicine by mouth with a glass of water. You can take it with or without food. If it upsets your stomach, take it with food. Chewable prenatal vitamin tablets may be chewed completely before swallowing. Follow the directions on the prescription label. The usual dose is taken once a day. Do not take your medicine more often than directed. Contact your pediatrician regarding the use of this medicine in children. Special care may be needed. This medicine is intended for females who are pregnant, breast-feeding, or may become pregnant. Overdosage: If you think you have taken too much of this medicine contact a poison control center or emergency room at once. NOTE: This medicine is only for you. Do not share this medicine with others. What if I miss a dose? If you miss a dose, take it as soon as you can. If it is almost time for your next dose, take only that dose. Do not take double or extra doses. What may interact with this medicine? -alendronate -antacids -cefdinir -cefditoren -etidronate -fluoroquinolone antibiotics (examples: ciprofloxacin, gatifloxacin,  levofloxacin) -ibandronate -levodopa -risedronate -tetracycline antibiotics (examples: doxycycline, minocycline, tetracycline) -thyroid hormones -warfarin This list may not describe all possible interactions. Give your health care provider a list of all the medicines, herbs, non-prescription drugs, or dietary supplements you use. Also tell them if you smoke, drink alcohol, or use illegal drugs. Some items may interact with your medicine. What should I watch for while using this medicine? See your health care professional for regular checks on your progress. Remember that vitamin and mineral supplements do not replace the need for good nutrition from a balanced diet. Stools commonly change color when vitamins and minerals are taken. Notify your health care professional if this change is alarming or accompanied by other symptoms, like abdominal pain. What side effects may I notice from receiving this medicine? Side effects that you should report to your doctor or health care professional as soon as possible: -allergic reaction such as skin rash or difficulty breathing -vomiting Side effects that usually do not require medical attention (report to your doctor or health care professional if they continue or are bothersome): -nausea -stomach upset This list may not describe all possible side effects. Call your doctor for medical advice about side effects. You may report side effects to FDA at 1-800-FDA-1088. Where should I keep my medicine? Keep out of the reach of children. Most vitamins and minerals should be stored at controlled room temperature. Check your specific product directions. Protect from heat and moisture. Throw away any unused medicine after the expiration date. NOTE: This sheet is a summary. It may not  cover all possible information. If you have questions about this medicine, talk to your doctor, pharmacist, or health care provider.    2016, Elsevier/Gold Standard. (2015-03-07  09:02:38) Tamsulosin capsules What is this medicine? TAMSULOSIN (tam SOO loe sin) is used to treat enlargement of the prostate gland in men, a condition called benign prostatic hyperplasia or BPH. It is not for use in women. It works by relaxing muscles in the prostate and bladder neck. This improves urine flow and reduces BPH symptoms. This medicine may be used for other purposes; ask your health care provider or pharmacist if you have questions. What should I tell my health care provider before I take this medicine? They need to know if you have any of the following conditions: -advanced kidney disease -advanced liver disease -low blood pressure -prostate cancer -an unusual or allergic reaction to tamsulosin, sulfa drugs, other medicines, foods, dyes, or preservatives -pregnant or trying to get pregnant -breast-feeding How should I use this medicine? Take this medicine by mouth about 30 minutes after the same meal every day. Follow the directions on the prescription label. Swallow the capsules whole with a glass of water. Do not crush, chew, or open capsules. Do not take your medicine more often than directed. Do not stop taking your medicine unless your doctor tells you to. Talk to your pediatrician regarding the use of this medicine in children. Special care may be needed. Overdosage: If you think you have taken too much of this medicine contact a poison control center or emergency room at once. NOTE: This medicine is only for you. Do not share this medicine with others. What if I miss a dose? If you miss a dose, take it as soon as you can. If it is almost time for your next dose, take only that dose. Do not take double or extra doses. If you stop taking your medicine for several days or more, ask your doctor or health care professional what dose you should start back on. What may interact with this medicine? -cimetidine -fluoxetine -ketoconazole -medicines for erectile disfunction like  sildenafil, tadalafil, vardenafil -medicines for high blood pressure -other alpha-blockers like alfuzosin, doxazosin, phentolamine, phenoxybenzamine, prazosin, terazosin -warfarin This list may not describe all possible interactions. Give your health care provider a list of all the medicines, herbs, non-prescription drugs, or dietary supplements you use. Also tell them if you smoke, drink alcohol, or use illegal drugs. Some items may interact with your medicine. What should I watch for while using this medicine? Visit your doctor or health care professional for regular check ups. You will need lab work done before you start this medicine and regularly while you are taking it. Check your blood pressure as directed. Ask your health care professional what your blood pressure should be, and when you should contact him or her. This medicine may make you feel dizzy or lightheaded. This is more likely to happen after the first dose, after an increase in dose, or during hot weather or exercise. Drinking alcohol and taking some medicines can make this worse. Do not drive, use machinery, or do anything that needs mental alertness until you know how this medicine affects you. Do not sit or stand up quickly. If you begin to feel dizzy, sit down until you feel better. These effects can decrease once your body adjusts to the medicine. Contact your doctor or health care professional right away if you have an erection that lasts longer than 4 hours or if it becomes painful. This  may be a sign of a serious problem and must be treated right away to prevent permanent damage. If you are thinking of having cataract surgery, tell your eye surgeon that you have taken this medicine. What side effects may I notice from receiving this medicine? Side effects that you should report to your doctor or health care professional as soon as possible: -allergic reactions like skin rash or itching, hives, swelling of the lips, mouth,  tongue, or throat -breathing problems -change in vision -feeling faint or lightheaded -irregular heartbeat -prolonged or painful erection -weakness Side effects that usually do not require medical attention (report to your doctor or health care professional if they continue or are bothersome): -back pain -change in sex drive or performance -constipation, nausea or vomiting -cough -drowsy -runny or stuffy nose -trouble sleeping This list may not describe all possible side effects. Call your doctor for medical advice about side effects. You may report side effects to FDA at 1-800-FDA-1088. Where should I keep my medicine? Keep out of the reach of children. Store at room temperature between 15 and 30 degrees C (59 and 86 degrees F). Throw away any unused medicine after the expiration date. NOTE: This sheet is a summary. It may not cover all possible information. If you have questions about this medicine, talk to your doctor, pharmacist, or health care provider.    2016, Elsevier/Gold Standard. (2012-08-10 14:11:34) Acetaminophen; Oxycodone tablets What is this medicine? ACETAMINOPHEN; OXYCODONE (a set a MEE noe fen; ox i KOE done) is a pain reliever. It is used to treat moderate to severe pain. This medicine may be used for other purposes; ask your health care provider or pharmacist if you have questions. What should I tell my health care provider before I take this medicine? They need to know if you have any of these conditions: -brain tumor -Crohn's disease, inflammatory bowel disease, or ulcerative colitis -drug abuse or addiction -head injury -heart or circulation problems -if you often drink alcohol -kidney disease or problems going to the bathroom -liver disease -lung disease, asthma, or breathing problems -an unusual or allergic reaction to acetaminophen, oxycodone, other opioid analgesics, other medicines, foods, dyes, or preservatives -pregnant or trying to get  pregnant -breast-feeding How should I use this medicine? Take this medicine by mouth with a full glass of water. Follow the directions on the prescription label. You can take it with or without food. If it upsets your stomach, take it with food. Take your medicine at regular intervals. Do not take it more often than directed. Talk to your pediatrician regarding the use of this medicine in children. Special care may be needed. Patients over 62 years old may have a stronger reaction and need a smaller dose. Overdosage: If you think you have taken too much of this medicine contact a poison control center or emergency room at once. NOTE: This medicine is only for you. Do not share this medicine with others. What if I miss a dose? If you miss a dose, take it as soon as you can. If it is almost time for your next dose, take only that dose. Do not take double or extra doses. What may interact with this medicine? -alcohol -antihistamines -barbiturates like amobarbital, butalbital, butabarbital, methohexital, pentobarbital, phenobarbital, thiopental, and secobarbital -benztropine -drugs for bladder problems like solifenacin, trospium, oxybutynin, tolterodine, hyoscyamine, and methscopolamine -drugs for breathing problems like ipratropium and tiotropium -drugs for certain stomach or intestine problems like propantheline, homatropine methylbromide, glycopyrrolate, atropine, belladonna, and dicyclomine -general anesthetics  like etomidate, ketamine, nitrous oxide, propofol, desflurane, enflurane, halothane, isoflurane, and sevoflurane -medicines for depression, anxiety, or psychotic disturbances -medicines for sleep -muscle relaxants -naltrexone -narcotic medicines (opiates) for pain -phenothiazines like perphenazine, thioridazine, chlorpromazine, mesoridazine, fluphenazine, prochlorperazine, promazine, and trifluoperazine -scopolamine -tramadol -trihexyphenidyl This list may not describe all possible  interactions. Give your health care provider a list of all the medicines, herbs, non-prescription drugs, or dietary supplements you use. Also tell them if you smoke, drink alcohol, or use illegal drugs. Some items may interact with your medicine. What should I watch for while using this medicine? Tell your doctor or health care professional if your pain does not go away, if it gets worse, or if you have new or a different type of pain. You may develop tolerance to the medicine. Tolerance means that you will need a higher dose of the medication for pain relief. Tolerance is normal and is expected if you take this medicine for a long time. Do not suddenly stop taking your medicine because you may develop a severe reaction. Your body becomes used to the medicine. This does NOT mean you are addicted. Addiction is a behavior related to getting and using a drug for a non-medical reason. If you have pain, you have a medical reason to take pain medicine. Your doctor will tell you how much medicine to take. If your doctor wants you to stop the medicine, the dose will be slowly lowered over time to avoid any side effects. You may get drowsy or dizzy. Do not drive, use machinery, or do anything that needs mental alertness until you know how this medicine affects you. Do not stand or sit up quickly, especially if you are an older patient. This reduces the risk of dizzy or fainting spells. Alcohol may interfere with the effect of this medicine. Avoid alcoholic drinks. There are different types of narcotic medicines (opiates) for pain. If you take more than one type at the same time, you may have more side effects. Give your health care provider a list of all medicines you use. Your doctor will tell you how much medicine to take. Do not take more medicine than directed. Call emergency for help if you have problems breathing. The medicine will cause constipation. Try to have a bowel movement at least every 2 to 3 days. If  you do not have a bowel movement for 3 days, call your doctor or health care professional. Do not take Tylenol (acetaminophen) or medicines that have acetaminophen with this medicine. Too much acetaminophen can be very dangerous. Many nonprescription medicines contain acetaminophen. Always read the labels carefully to avoid taking more acetaminophen. What side effects may I notice from receiving this medicine? Side effects that you should report to your doctor or health care professional as soon as possible: -allergic reactions like skin rash, itching or hives, swelling of the face, lips, or tongue -breathing difficulties, wheezing -confusion -light headedness or fainting spells -severe stomach pain -unusually weak or tired -yellowing of the skin or the whites of the eyes Side effects that usually do not require medical attention (report to your doctor or health care professional if they continue or are bothersome): -dizziness -drowsiness -nausea -vomiting This list may not describe all possible side effects. Call your doctor for medical advice about side effects. You may report side effects to FDA at 1-800-FDA-1088. Where should I keep my medicine? Keep out of the reach of children. This medicine can be abused. Keep your medicine in a safe place to  protect it from theft. Do not share this medicine with anyone. Selling or giving away this medicine is dangerous and against the law. This medicine may cause accidental overdose and death if it taken by other adults, children, or pets. Mix any unused medicine with a substance like cat litter or coffee grounds. Then throw the medicine away in a sealed container like a sealed bag or a coffee can with a lid. Do not use the medicine after the expiration date. Store at room temperature between 20 and 25 degrees C (68 and 77 degrees F). NOTE: This sheet is a summary. It may not cover all possible information. If you have questions about this medicine, talk  to your doctor, pharmacist, or health care provider.    2016, Elsevier/Gold Standard. (2014-07-11 15:18:46) Kidney Stones Kidney stones (urolithiasis) are deposits that form inside your kidneys. The intense pain is caused by the stone moving through the urinary tract. When the stone moves, the ureter goes into spasm around the stone. The stone is usually passed in the urine.  CAUSES   A disorder that makes certain neck glands produce too much parathyroid hormone (primary hyperparathyroidism).  A buildup of uric acid crystals, similar to gout in your joints.  Narrowing (stricture) of the ureter.  A kidney obstruction present at birth (congenital obstruction).  Previous surgery on the kidney or ureters.  Numerous kidney infections. SYMPTOMS   Feeling sick to your stomach (nauseous).  Throwing up (vomiting).  Blood in the urine (hematuria).  Pain that usually spreads (radiates) to the groin.  Frequency or urgency of urination. DIAGNOSIS   Taking a history and physical exam.  Blood or urine tests.  CT scan.  Occasionally, an examination of the inside of the urinary bladder (cystoscopy) is performed. TREATMENT   Observation.  Increasing your fluid intake.  Extracorporeal shock wave lithotripsy--This is a noninvasive procedure that uses shock waves to break up kidney stones.  Surgery may be needed if you have severe pain or persistent obstruction. There are various surgical procedures. Most of the procedures are performed with the use of small instruments. Only small incisions are needed to accommodate these instruments, so recovery time is minimized. The size, location, and chemical composition are all important variables that will determine the proper choice of action for you. Talk to your health care provider to better understand your situation so that you will minimize the risk of injury to yourself and your kidney.  HOME CARE INSTRUCTIONS   Drink enough water and  fluids to keep your urine clear or pale yellow. This will help you to pass the stone or stone fragments.  Strain all urine through the provided strainer. Keep all particulate matter and stones for your health care provider to see. The stone causing the pain may be as small as a grain of salt. It is very important to use the strainer each and every time you pass your urine. The collection of your stone will allow your health care provider to analyze it and verify that a stone has actually passed. The stone analysis will often identify what you can do to reduce the incidence of recurrences.  Only take over-the-counter or prescription medicines for pain, discomfort, or fever as directed by your health care provider.  Keep all follow-up visits as told by your health care provider. This is important.  Get follow-up X-rays if required. The absence of pain does not always mean that the stone has passed. It may have only stopped moving. If the  urine remains completely obstructed, it can cause loss of kidney function or even complete destruction of the kidney. It is your responsibility to make sure X-rays and follow-ups are completed. Ultrasounds of the kidney can show blockages and the status of the kidney. Ultrasounds are not associated with any radiation and can be performed easily in a matter of minutes.  Make changes to your daily diet as told by your health care provider. You may be told to:  Limit the amount of salt that you eat.  Eat 5 or more servings of fruits and vegetables each day.  Limit the amount of meat, poultry, fish, and eggs that you eat.  Collect a 24-hour urine sample as told by your health care provider.You may need to collect another urine sample every 6-12 months. SEEK MEDICAL CARE IF:  You experience pain that is progressive and unresponsive to any pain medicine you have been prescribed. SEEK IMMEDIATE MEDICAL CARE IF:   Pain cannot be controlled with the prescribed  medicine.  You have a fever or shaking chills.  The severity or intensity of pain increases over 18 hours and is not relieved by pain medicine.  You develop a new onset of abdominal pain.  You feel faint or pass out.  You are unable to urinate.   This information is not intended to replace advice given to you by your health care provider. Make sure you discuss any questions you have with your health care provider.   Document Released: 08/10/2005 Document Revised: 05/01/2015 Document Reviewed: 01/11/2013 Elsevier Interactive Patient Education 2016 [employer] Guidelines to Help Prevent Kidney Stones Your risk of kidney stones can be decreased by adjusting the foods you eat. The most important thing you can do is drink enough fluid. You should drink enough fluid to keep your urine clear or pale yellow. The following guidelines provide specific information for the type of kidney stone you have had. GUIDELINES ACCORDING TO TYPE OF KIDNEY STONE Calcium Oxalate Kidney Stones  Reduce the amount of salt you eat. Foods that have a lot of salt cause your body to release excess calcium into your urine. The excess calcium can combine with a substance called oxalate to form kidney stones.  Reduce the amount of animal protein you eat if the amount you eat is excessive. Animal protein causes your body to release excess calcium into your urine. Ask your dietitian how much protein from animal sources you should be eating.  Avoid foods that are high in oxalates. If you take vitamins, they should have less than 500 mg of vitamin C. Your body turns vitamin C into oxalates. You do not need to avoid fruits and vegetables high in vitamin C. Calcium Phosphate Kidney Stones  Reduce the amount of salt you eat to help prevent the release of excess calcium into your urine.  Reduce the amount of animal protein you eat if the amount you eat is excessive. Animal protein causes your body to release excess  calcium into your urine. Ask your dietitian how much protein from animal sources you should be eating.  Get enough calcium from food or take a calcium supplement (ask your dietitian for recommendations). Food sources of calcium that do not increase your risk of kidney stones include:  Broccoli.  Dairy products, such as cheese and yogurt.  Pudding. Uric Acid Kidney Stones  Do not have more than 6 oz of animal protein per day. FOOD SOURCES Animal Protein Sources  Meat (all types).  Poultry.  Eggs.  Fish, seafood. Foods High in [employer] seasonings.  Soy sauce.  Teriyaki sauce.  Cured and processed meats.  Salted crackers and snack foods.  Fast food.  Canned soups and most canned foods. Foods High in Oxalates  Grains:  Amaranth.  Barley.  Grits.  Wheat germ.  Bran.  Buckwheat flour.  All bran cereals.  Pretzels.  Whole wheat bread.  Vegetables:  Beans (wax).  Beets and beet greens.  Collard greens.  Eggplant.  Escarole.  Leeks.  Okra.  Parsley.  Rutabagas.  Spinach.  Swiss chard.  Tomato paste.  Fried potatoes.  Sweet potatoes.  Fruits:  Red currants.  Figs.  Kiwi.  Rhubarb.  Meat and Other Protein Sources:  Beans (dried).  Soy burgers and other soybean products.  Miso.  Nuts (peanuts, almonds, pecans, cashews, hazelnuts).  Nut butters.  Sesame seeds and tahini (paste made of sesame seeds).  Poppy seeds.  Beverages:  Chocolate drink mixes.  Soy milk.  Instant iced tea.  Juices made from high-oxalate fruits or vegetables.  Other:  Carob.  Chocolate.  Fruitcake.  Marmalades.   This information is not intended to replace advice given to you by your health care provider. Make sure you discuss any questions you have with your health care provider.   Document Released: 12/05/2010 Document Revised: 08/15/2013 Document Reviewed: 07/07/2013 Elsevier Interactive Patient Education [employer]

## 2016-03-11 NOTE — Final Progress Note (Signed)
Physician Final Progress Note  Patient ID: Jennifer Richmond MRN: [id number] DOB/AGE: 1986-05-02 30 y.o.  Admit date: 03/10/2016 Admitting provider: Gae Dry, MD Discharge date: 03/11/2016   Admission Diagnoses: IUP at 33wk1d with nausea and vomiting Right flank pain Nephrolithiasis  Discharge Diagnoses:  Principal Problem:   Nausea and vomiting during pregnancy Active Problems:   Nephrolithiasis   Flank pain   Consults: NONE  Significant Findings/ Diagnostic Studies: 30 yo G4 P1021 with EDC=05-18-16 admitted this AM at 33wk1day with nausea and vomiting related to her flank pain due to nephrolithiasis and moderate hydroureter. She received IV hydration and IV compazine and zofran and was able to keep down some cereal, popsickle and crackers this Am. She is requesting to be discharged. Her flank pain is also much better this AM. She was just seen by the urologist yesterday and received RX for roxicodone 5 mgm and Zofran 4 mgm po. Had a reactive NST on admission.  Procedures: none  Discharge Condition: stable  Disposition: 01-Home or Self Care  Diet: as tolerated  Discharge Activity: Activity as tolerated. No driving if taking narcotic.     Medication List    ASK your doctor about these medications        acetaminophen 325 MG tablet  Commonly known as:  TYLENOL  Take 2 tablets (650 mg total) by mouth every 4 (four) hours as needed for mild pain.     ondansetron 4 MG tablet  Commonly known as:  ZOFRAN  Take 1 tablet (4 mg total) by mouth every 8 (eight) hours as needed for nausea or vomiting.     oxyCODONE 5 MG immediate release tablet  Commonly known as:  Oxy IR/ROXICODONE  Take 1 tablet (5 mg total) by mouth every 4 (four) hours as needed for moderate pain.     prenatal multivitamin Tabs tablet  Take 1 tablet by mouth daily at 12 noon.     tamsulosin 0.4 MG Caps capsule  Commonly known as:  FLOMAX  Take 1 capsule (0.4 mg total) by mouth 2 (two) times daily.          Total time spent taking care of this patient: 15 minutes  Signed: Herson Prichard 03/11/2016, 11:53 AM

## 2016-03-11 NOTE — OB Triage Note (Signed)
Recvd pt from ED. Pt c/o vomiting all day and can't keep any food or drink down. Feeling baby move ok and has no other complaints

## 2016-03-11 NOTE — Progress Notes (Signed)
CNM order for pt discharge home.  Pt given all d/c instructions and understands all including f/u appts with MD. Pt discharged home via wheelchair by auxillary.

## 2016-03-11 NOTE — H&P (Signed)
Obstetric History and Physical  Jennifer Richmond is a 30 y.o. [phone] with Estimated Date of Delivery: 04/28/16 who presents at [redacted]w[redacted]d  presenting for c/o nausea vomiting all day worsening tonight; has kidney stone and has been on a lot of meds for this (pain, Flomax).  Pain is right flank, no change.   Denies regular contractions, no vaginal bleeding, intact membranes, with active fetal movement.    Prenatal Course Source of Care: [facility]  (records not available at time of this note) Pregnancy complications or risks: Patient Active Problem List   Diagnosis Date Noted  . Nausea and vomiting during pregnancy 03/11/2016  . Flank pain 03/05/2016  . Abdominal pain affecting pregnancy 02/25/2016  . Nephrolithiasis 02/25/2016     Prenatal Transfer Tool   Past Medical History  Diagnosis Date  . Kidney stones   . Panic attacks   . Abdominal pain   . Anxiety   . Back pain   . GERD (gastroesophageal reflux disease)   . Bipolar disorder ([place])   . Asthma   . PTSD (post-traumatic stress disorder)   . UTI (urinary tract infection)   . Urinary hesitancy   . Gross hematuria     Past Surgical History  Procedure Laterality Date  . Dilation and curettage of uterus    . Ovarian resection      OB History  Gravida Para Term Preterm AB SAB TAB Ectopic Multiple Living  4    2 1 1   1     # Outcome Date GA Lbr Len/2nd Weight Sex Delivery Anes PTL Lv  4 Current           3 SAB 10/25/13          2 Gravida 09/23/05    M Vag-Spont   Y  1 TAB 05/27/00     TAB         Social History   Social History  . Marital Status: Married    Spouse Name: N/A  . Number of Children: N/A  . Years of Education: N/A   Social History Main Topics  . Smoking status: Former Smoker    Types: Cigarettes  . Smokeless tobacco: Not on file  . Alcohol Use: No  . Drug Use: No  . Sexual Activity: Yes   Other Topics Concern  . Not on file   Social History Narrative    Family History  Problem Relation Age of  Onset  . Diabetes Father   . Kidney nephrosis Father   . Seizures Mother     Prescriptions prior to admission  Medication Sig Dispense Refill Last Dose  . clonazePAM (KLONOPIN) 1 MG tablet Take 1 mg by mouth 2 (two) times daily.   Taking  . nitrofurantoin, macrocrystal-monohydrate, (MACROBID) 100 MG capsule Take 1 capsule (100 mg total) by mouth daily. 30 capsule 12 Taking    Allergies  Allergen Reactions  . Shrimp [Shellfish Allergy]     Review of Systems: Negative except for what is mentioned in HPI.  Physical Exam: LMP 06/25/2015 (Exact Date) GENERAL: Well-developed, well-nourished female in no acute distress.  ABDOMEN: Soft, nontender, nondistended, gravid. CVAT: mild rt CVAT FHT: Category: 1 Baseline rate 140 bpm   Variability moderate  Accelerations present   Decelerations none Contractions: absent   Pertinent Labs/Studies:   Results for orders placed or performed in visit on 03/10/16 (from the past 24 hour(s))  Urinalysis, Complete     Status: Abnormal   Collection Time: 03/10/16 10:18 AM  Result  Value Ref Range   Specific Gravity, UA 1.025 1.005 - 1.030   pH, UA 5.0 5.0 - 7.5   Color, UA Yellow Yellow   Appearance Ur Cloudy (A) Clear   Leukocytes, UA Negative Negative   Protein, UA Trace (A) Negative/Trace   Glucose, UA Negative Negative   Ketones, UA Negative Negative   RBC, UA 1+ (A) Negative   Bilirubin, UA Negative Negative   Urobilinogen, Ur 0.2 0.2 - 1.0 mg/dL   Nitrite, UA Negative Negative   Microscopic Examination See below:    Narrative   Performed at:  [facility] [address] [place], [place], [place]  [id number] Lab Director: Colletta [facility], Phone:  [phone]  Microscopic Examination     Status: Abnormal   Collection Time: 03/10/16 10:18 AM  Result Value Ref Range   WBC, UA 0-5 0 -  5 /hpf   RBC, UA 3-10 (A) 0 -  2 /hpf   Epithelial Cells (non renal) >10 (A) 0 - 10 /hpf   Crystals Present (A) N/A   Crystal Type  Calcium Oxalate N/A   Bacteria, UA Few None seen/Few   Narrative   Performed at:  [facility] [address] [place], [place], [place]  [id number] Lab Director: Colletta [facility], Phone:  [phone]   A NST procedure was performed with FHR monitoring and a normal baseline established, appropriate time of 20-40 minutes of evaluation, and accels >2 seen w 15x15 characteristics.  Results show a REACTIVE NST.   Assessment : IUP at [redacted]w[redacted]d back pain and nausea/vomiting; no evidence of Preterm labor  Plan: IVF and Compazine, analgesia but counseled on need to minimize narcotic use due to fetal effects Observe tonight w discharge tomorrow FWB reassuring

## 2016-04-06 ENCOUNTER — Inpatient Hospital Stay
Admission: EM | Admit: 2016-04-06 | Discharge: 2016-04-08 | DRG: 775 | Disposition: A | Discharge status: Home / Self Care | Payer: Medicaid Other | Attending: Obstetrics & Gynecology | Admitting: Obstetrics & Gynecology

## 2016-04-06 ENCOUNTER — Inpatient Hospital Stay: Payer: Medicaid Other | Admitting: Anesthesiology

## 2016-04-06 ENCOUNTER — Encounter: Payer: Self-pay | Admitting: Advanced Practice Midwife

## 2016-04-06 DIAGNOSIS — O99344 Other mental disorders complicating childbirth: Secondary | ICD-10-CM | POA: Diagnosis present

## 2016-04-06 DIAGNOSIS — F17210 Nicotine dependence, cigarettes, uncomplicated: Secondary | ICD-10-CM | POA: Diagnosis present

## 2016-04-06 DIAGNOSIS — Z3A37 37 weeks gestation of pregnancy: Secondary | ICD-10-CM

## 2016-04-06 DIAGNOSIS — F419 Anxiety disorder, unspecified: Secondary | ICD-10-CM | POA: Diagnosis present

## 2016-04-06 DIAGNOSIS — N200 Calculus of kidney: Secondary | ICD-10-CM | POA: Diagnosis present

## 2016-04-06 DIAGNOSIS — F319 Bipolar disorder, unspecified: Secondary | ICD-10-CM | POA: Diagnosis present

## 2016-04-06 DIAGNOSIS — Z833 Family history of diabetes mellitus: Secondary | ICD-10-CM

## 2016-04-06 DIAGNOSIS — O99334 Smoking (tobacco) complicating childbirth: Secondary | ICD-10-CM | POA: Diagnosis present

## 2016-04-06 DIAGNOSIS — O26833 Pregnancy related renal disease, third trimester: Secondary | ICD-10-CM | POA: Diagnosis present

## 2016-04-06 DIAGNOSIS — O99824 Streptococcus B carrier state complicating childbirth: Secondary | ICD-10-CM | POA: Diagnosis present

## 2016-04-06 LAB — CBC
HEMATOCRIT: 32 % — AB (ref 35.0–47.0)
HEMOGLOBIN: 10.4 g/dL — AB (ref 12.0–16.0)
MCH: 26.8 pg (ref 26.0–34.0)
MCHC: 32.6 g/dL (ref 32.0–36.0)
MCV: 82.4 fL (ref 80.0–100.0)
Platelets: 268 10*3/uL (ref 150–440)
RBC: 3.88 MIL/uL (ref 3.80–5.20)
RDW: 14.9 % — ABNORMAL HIGH (ref 11.5–14.5)
WBC: 21.7 10*3/uL — AB (ref 3.6–11.0)

## 2016-04-06 LAB — TYPE AND SCREEN
ABO/RH(D): A POS
ANTIBODY SCREEN: NEGATIVE

## 2016-04-06 MED ORDER — NALBUPHINE HCL 10 MG/ML IJ SOLN: 5.0000 mg | INTRAMUSCULAR | Status: DC | PRN

## 2016-04-06 MED ORDER — SODIUM CHLORIDE 0.9 % IV SOLN
2.0000 g | Freq: Once | INTRAVENOUS | Status: AC
  Administered 2016-04-06: 2 g via INTRAVENOUS

## 2016-04-06 MED ORDER — FENTANYL 2.5 MCG/ML W/ROPIVACAINE 0.2% IN NS 100 ML EPIDURAL INFUSION (ARMC-ANES)
EPIDURAL | Status: DC | PRN
  Administered 2016-04-06: 10 mL/h via EPIDURAL

## 2016-04-06 MED ORDER — AMMONIA AROMATIC IN INHA
RESPIRATORY_TRACT | Status: AC
  Filled 2016-04-06: qty 10

## 2016-04-06 MED ORDER — DEXTROSE 5 % IV SOLN
1.0000 ug/kg/h | INTRAVENOUS | Status: DC | PRN
  Filled 2016-04-06: qty 2

## 2016-04-06 MED ORDER — FENTANYL 2.5 MCG/ML W/ROPIVACAINE 0.2% IN NS 100 ML EPIDURAL INFUSION (ARMC-ANES)
EPIDURAL | Status: AC
  Filled 2016-04-06: qty 100

## 2016-04-06 MED ORDER — SCOPOLAMINE 1 MG/3DAYS TD PT72: 1.0000 | MEDICATED_PATCH | Freq: Once | TRANSDERMAL | Status: DC

## 2016-04-06 MED ORDER — MISOPROSTOL 200 MCG PO TABS
ORAL_TABLET | ORAL | Status: AC
  Filled 2016-04-06: qty 4

## 2016-04-06 MED ORDER — MEPERIDINE HCL 25 MG/ML IJ SOLN: 6.2500 mg | INTRAMUSCULAR | Status: DC | PRN

## 2016-04-06 MED ORDER — SODIUM CHLORIDE 0.9 % IV SOLN
INTRAVENOUS | Status: AC
  Administered 2016-04-06: 2 g via INTRAVENOUS
  Filled 2016-04-06: qty 2000

## 2016-04-06 MED ORDER — LIDOCAINE HCL (PF) 1 % IJ SOLN
INTRAMUSCULAR | Status: AC
  Filled 2016-04-06: qty 30

## 2016-04-06 MED ORDER — NALBUPHINE HCL 10 MG/ML IJ SOLN: 5.0000 mg | Freq: Once | INTRAMUSCULAR | Status: DC | PRN

## 2016-04-06 MED ORDER — BUPIVACAINE HCL (PF) 0.25 % IJ SOLN
INTRAMUSCULAR | Status: DC | PRN
  Administered 2016-04-06: 5 mL via EPIDURAL

## 2016-04-06 MED ORDER — ACETAMINOPHEN 325 MG PO TABS: 650.0000 mg | ORAL_TABLET | ORAL | Status: DC | PRN

## 2016-04-06 MED ORDER — FENTANYL 2.5 MCG/ML W/ROPIVACAINE 0.2% IN NS 100 ML EPIDURAL INFUSION (ARMC-ANES)
10.0000 mL/h | EPIDURAL | Status: DC
Start: 2016-04-07 — End: 2016-04-07

## 2016-04-06 MED ORDER — OXYTOCIN 40 UNITS IN LACTATED RINGERS INFUSION - SIMPLE MED: 2.5000 [IU]/h | INTRAVENOUS | Status: DC

## 2016-04-06 MED ORDER — DIPHENHYDRAMINE HCL 25 MG PO CAPS: 25.0000 mg | ORAL_CAPSULE | ORAL | Status: DC | PRN

## 2016-04-06 MED ORDER — ONDANSETRON HCL 4 MG/2ML IJ SOLN: 4.0000 mg | Freq: Three times a day (TID) | INTRAMUSCULAR | Status: DC | PRN

## 2016-04-06 MED ORDER — ONDANSETRON HCL 4 MG/2ML IJ SOLN
4.0000 mg | Freq: Four times a day (QID) | INTRAMUSCULAR | Status: DC | PRN
  Administered 2016-04-07: 4 mg via INTRAVENOUS

## 2016-04-06 MED ORDER — SODIUM CHLORIDE 0.9% FLUSH: 3.0000 mL | INTRAVENOUS | Status: DC | PRN

## 2016-04-06 MED ORDER — DIPHENHYDRAMINE HCL 50 MG/ML IJ SOLN
12.5000 mg | INTRAMUSCULAR | Status: DC | PRN
Start: 2016-04-06 — End: 2016-04-07

## 2016-04-06 MED ORDER — KETOROLAC TROMETHAMINE 30 MG/ML IJ SOLN: 30.0000 mg | Freq: Four times a day (QID) | INTRAMUSCULAR | Status: DC | PRN

## 2016-04-06 MED ORDER — BUTORPHANOL TARTRATE 1 MG/ML IJ SOLN
2.0000 mg | INTRAMUSCULAR | Status: DC | PRN
  Administered 2016-04-06: 2 mg via INTRAVENOUS
  Filled 2016-04-06: qty 2

## 2016-04-06 MED ORDER — OXYTOCIN 10 UNIT/ML IJ SOLN
INTRAMUSCULAR | Status: AC
  Filled 2016-04-06: qty 2

## 2016-04-06 MED ORDER — OXYTOCIN 40 UNITS IN LACTATED RINGERS INFUSION - SIMPLE MED
INTRAVENOUS | Status: AC
  Filled 2016-04-06: qty 1000

## 2016-04-06 MED ORDER — LACTATED RINGERS IV SOLN
INTRAVENOUS | Status: DC
  Administered 2016-04-06: 23:00:00 via INTRAVENOUS
  Administered 2016-04-06: 125 mL/h via INTRAVENOUS

## 2016-04-06 MED ORDER — NALOXONE HCL 0.4 MG/ML IJ SOLN
0.4000 mg | INTRAMUSCULAR | Status: DC | PRN
Start: 2016-04-06 — End: 2016-04-07

## 2016-04-06 MED ORDER — LACTATED RINGERS IV SOLN: 500.0000 mL | INTRAVENOUS | Status: DC | PRN

## 2016-04-06 MED ORDER — OXYTOCIN BOLUS FROM INFUSION: 500.0000 mL | Freq: Once | INTRAVENOUS | Status: DC

## 2016-04-06 MED ORDER — SODIUM CHLORIDE 0.9 % IV SOLN
1.0000 g | INTRAVENOUS | Status: DC
  Administered 2016-04-06: 1 g via INTRAVENOUS
  Filled 2016-04-06 (×3): qty 1000

## 2016-04-06 NOTE — Anesthesia Preprocedure Evaluation (Addendum)
Anesthesia Evaluation  Patient identified by MRN, date of birth, ID band Patient awake    Reviewed: Allergy & Precautions, NPO status , Patient's Chart, lab work & pertinent test results, reviewed documented beta blocker date and time   Airway Mallampati: II  TM Distance: >3 FB     Dental  (+) Chipped   Pulmonary asthma , Current Smoker,           Cardiovascular      Neuro/Psych PSYCHIATRIC DISORDERS Anxiety Bipolar Disorder    GI/Hepatic   Endo/Other    Renal/GU      Musculoskeletal   Abdominal   Peds  Hematology   Anesthesia Other Findings   Reproductive/Obstetrics                            Anesthesia Physical Anesthesia Plan  ASA: II  Anesthesia Plan: Epidural   Post-op Pain Management:    Induction:   Airway Management Planned:   Additional Equipment:   Intra-op Plan:   Post-operative Plan:   Informed Consent: I have reviewed the patients History and Physical, chart, labs and discussed the procedure including the risks, benefits and alternatives for the proposed anesthesia with the patient or authorized representative who has indicated his/her understanding and acceptance.     Plan Discussed with: CRNA  Anesthesia Plan Comments:         Anesthesia Quick Evaluation

## 2016-04-06 NOTE — Progress Notes (Signed)
S: Comfortable with epidural O: Cervix 8.5/90/0, VSS, FHR baseline 130s, small variable decels, ctx q 2-4 min A: IUP at [redacted]w[redacted]d, SROM, labor P: Anticipate vaginal delivery

## 2016-04-06 NOTE — Progress Notes (Signed)
Dr Marcello Moores called. Informed pt continues to have no anesthesia level on the right side. T10 on left. MD plans to adjust catheter and gibe epidural bolus

## 2016-04-06 NOTE — H&P (Signed)
Obstetric History and Physical  Jennifer Richmond is a 30 y.o. [phone] with Estimated Date of Delivery: 04/28/16 per 6 wk [place] who presents at [redacted]w[redacted]d  presenting for SROM at 1530 today. Patient states she is starting to have regular contractions, no vaginal bleeding, ruptured, clear fluid membranes, with active fetal movement.    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 Hasn't started meds yet Anemia Kidney stones - plans f/u with Urology after delivery Tobacco use - currently 4 cig/day Patient Active Problem List   Diagnosis Date Noted  . Labor and delivery, indication for care 04/06/2016  . Nausea and vomiting during pregnancy 03/11/2016  . Flank pain 03/05/2016  . Abdominal pain affecting pregnancy 02/25/2016  . Nephrolithiasis 02/25/2016   She plans to bottle feed (may pump and give baby milk initially) She desires undecided for postpartum contraception.   Prenatal labs and studies: ABO, Rh: A+  Antibody: Neg Rubella: Immune Varicella: Immune RPR:  NR HBsAg:  Neg HIV: Neg GC/CT: Neg/Neg GBS: Positive 1 hr Glucola: 110   Genetic screening: negative 1st trimester screen  TDAP: not given   Prenatal Transfer Tool   Past Medical History:  Diagnosis Date  . Abdominal pain   . Anxiety   . Asthma   . Back pain   . Bipolar disorder ([place])   . GERD (gastroesophageal reflux disease)   . Gross hematuria   . Kidney stones   . Panic attacks   . PTSD (post-traumatic stress disorder)   . Urinary hesitancy   . UTI (urinary tract infection)     Past Surgical History:  Procedure Laterality Date  . DILATION AND CURETTAGE OF UTERUS    . LAPAROSCOPIC OVARIAN CYSTECTOMY      OB History  Gravida Para Term Preterm AB Living  4 1 1   2 1   SAB TAB Ectopic Multiple Live Births  1 1     1     # Outcome Date GA Lbr Len/2nd Weight Sex Delivery Anes  PTL Lv  4 Current           3 SAB 10/25/13          2 Term 09/23/05    M Vag-Spont   LIV  1 TAB 05/27/00     TAB         Social History   Social History  . Marital status: Married    Spouse name: N/A  . Number of children: N/A  . Years of education: N/A   Social History Main Topics  . Smoking status: Current Every Day Smoker    Packs/day: 0.25    Years: 0.00    Types: Cigarettes  . Smokeless tobacco: None  . Alcohol use No  . Drug use: No  . Sexual activity: Yes   Other Topics Concern  . None   Social History Narrative  . None    Family History  Problem Relation Age of Onset  . Diabetes Father   . Kidney nephrosis Father   . Seizures Mother     Prescriptions Prior to Admission  Medication Sig Dispense Refill Last Dose  . Prenatal Vit-Fe Fumarate-FA (PRENATAL MULTIVITAMIN) TABS tablet Take 1 tablet by mouth daily at 12 noon.   03/10/2016 at Unknown time  . acetaminophen (TYLENOL) 325 MG tablet Take 2 tablets (650 mg total) by mouth every 4 (four) hours as needed  for mild pain.   03/10/2016 at Unknown time  . oxyCODONE (OXY IR/ROXICODONE) 5 MG immediate release tablet Take 1 tablet (5 mg total) by mouth every 4 (four) hours as needed for moderate pain. 15 tablet 0 03/10/2016 at Unknown time  . tamsulosin (FLOMAX) 0.4 MG CAPS capsule Take 1 capsule (0.4 mg total) by mouth 2 (two) times daily. 60 capsule 0 03/10/2016 at Unknown time    Allergies  Allergen Reactions  . Shrimp [Shellfish Allergy]     Review of Systems: Negative except for what is mentioned in HPI.  Physical Exam: LMP 06/25/2015 (Exact Date)  GENERAL: Well-developed, well-nourished female in no acute distress.  ABDOMEN: Soft, nontender, nondistended, gravid. EXTREMITIES: Nontender, no edema Cervical Exam: Dilatation 2 cm   Effacement 90 %   Station -2, grossly ruptured   Presentation: cephalic FHT: Category: 1 Baseline rate 130 bpm   Variability moderate  Accelerations present   Decelerations  none Contractions: Every 2-4 mins   Pertinent Labs/Studies:   No results found for this or any previous visit (from the past 24 hour(s)).  Assessment : IUP at [redacted]w[redacted]d, SROM, early labor  Plan: Admit for labor, Antibiotics for GBS prophylaxis and Observe for cervical change  Nitrous, IV stadol and/or epidural when indicated Anticipate vaginal delivery

## 2016-04-06 NOTE — Progress Notes (Signed)
Dr Marcello Moores called for epidural placement. Informed of lab values and pt currently on antibiotics for GBS+ status. 2042 MD in room 2052 epidural placed 2052 test dose Drip started at 2100

## 2016-04-06 NOTE — Anesthesia Procedure Notes (Signed)
Epidural Patient location during procedure: OB  Preanesthetic Checklist Completed: patient identified, site marked, surgical consent, pre-op evaluation, timeout performed, IV checked, risks and benefits discussed and monitors and equipment checked  Epidural Patient position: sitting Prep: Betadine Patient monitoring: heart rate, continuous pulse ox and blood pressure Approach: midline Location: L4-L5 Injection technique: LOR saline  Needle:  Needle type: Tuohy  Needle gauge: 18 G Needle length: 9 cm and 9 Catheter type: closed end flexible Catheter size: 20 Guage Test dose: negative and 1.5% lidocaine with Epi 1:200 K  Assessment Events: blood not aspirated, injection not painful, no injection resistance, negative IV test and no paresthesia  Additional Notes   Patient tolerated the insertion well without complications.Reason for block:procedure for pain

## 2016-04-07 ENCOUNTER — Encounter: Payer: Self-pay | Admitting: Advanced Practice Midwife

## 2016-04-07 ENCOUNTER — Inpatient Hospital Stay: Payer: Medicaid Other

## 2016-04-07 LAB — CBC
HCT: 30.7 % — ABNORMAL LOW (ref 35.0–47.0)
Hemoglobin: 10.1 g/dL — ABNORMAL LOW (ref 12.0–16.0)
MCH: 26.7 pg (ref 26.0–34.0)
MCHC: 32.9 g/dL (ref 32.0–36.0)
MCV: 81.2 fL (ref 80.0–100.0)
Platelets: 241 10*3/uL (ref 150–440)
RBC: 3.78 MIL/uL — AB (ref 3.80–5.20)
RDW: 14.7 % — AB (ref 11.5–14.5)
WBC: 30.1 10*3/uL — ABNORMAL HIGH (ref 3.6–11.0)

## 2016-04-07 LAB — RPR: RPR Ser Ql: NONREACTIVE

## 2016-04-07 MED ORDER — PRENATAL MULTIVITAMIN CH
1.0000 | ORAL_TABLET | Freq: Every day | ORAL | Status: DC
  Administered 2016-04-07: 1 via ORAL
  Filled 2016-04-07: qty 1

## 2016-04-07 MED ORDER — ONDANSETRON HCL 4 MG PO TABS: 4.0000 mg | ORAL_TABLET | ORAL | Status: DC | PRN

## 2016-04-07 MED ORDER — LACTATED RINGERS IV SOLN: INTRAVENOUS | Status: DC

## 2016-04-07 MED ORDER — ONDANSETRON HCL 4 MG/2ML IJ SOLN: 4.0000 mg | INTRAMUSCULAR | Status: DC | PRN

## 2016-04-07 MED ORDER — ZOLPIDEM TARTRATE 5 MG PO TABS
5.0000 mg | ORAL_TABLET | Freq: Every evening | ORAL | Status: DC | PRN
Start: 2016-04-07 — End: 2016-04-08

## 2016-04-07 MED ORDER — OXYCODONE-ACETAMINOPHEN 5-325 MG PO TABS
1.0000 | ORAL_TABLET | ORAL | Status: DC | PRN
  Administered 2016-04-07 – 2016-04-08 (×6): 1 via ORAL
  Filled 2016-04-07 (×5): qty 1

## 2016-04-07 MED ORDER — OXYCODONE-ACETAMINOPHEN 5-325 MG PO TABS
ORAL_TABLET | ORAL | Status: AC
  Administered 2016-04-07: 1 via ORAL
  Filled 2016-04-07: qty 1

## 2016-04-07 MED ORDER — IBUPROFEN 600 MG PO TABS
600.0000 mg | ORAL_TABLET | Freq: Four times a day (QID) | ORAL | Status: DC
  Administered 2016-04-07 – 2016-04-08 (×5): 600 mg via ORAL
  Filled 2016-04-07 (×5): qty 1

## 2016-04-07 MED ORDER — FERROUS SULFATE 325 (65 FE) MG PO TABS
325.0000 mg | ORAL_TABLET | Freq: Every day | ORAL | Status: DC
  Administered 2016-04-07 – 2016-04-08 (×2): 325 mg via ORAL
  Filled 2016-04-07 (×2): qty 1

## 2016-04-07 MED ORDER — DOCUSATE SODIUM 100 MG PO CAPS: 100.0000 mg | ORAL_CAPSULE | Freq: Two times a day (BID) | ORAL | Status: DC | PRN

## 2016-04-07 MED ORDER — WITCH HAZEL-GLYCERIN EX PADS: 1.0000 "application " | MEDICATED_PAD | CUTANEOUS | Status: DC | PRN

## 2016-04-07 MED ORDER — DIBUCAINE 1 % RE OINT: 1.0000 "application " | TOPICAL_OINTMENT | RECTAL | Status: DC | PRN

## 2016-04-07 MED ORDER — DIPHENHYDRAMINE HCL 25 MG PO CAPS: 25.0000 mg | ORAL_CAPSULE | Freq: Four times a day (QID) | ORAL | Status: DC | PRN

## 2016-04-07 MED ORDER — ONDANSETRON HCL 4 MG/2ML IJ SOLN
INTRAMUSCULAR | Status: AC
  Administered 2016-04-07: 4 mg via INTRAVENOUS
  Filled 2016-04-07: qty 2

## 2016-04-07 MED ORDER — ACETAMINOPHEN 325 MG PO TABS: 650.0000 mg | ORAL_TABLET | ORAL | Status: DC | PRN

## 2016-04-07 MED ORDER — SIMETHICONE 80 MG PO CHEW: 80.0000 mg | CHEWABLE_TABLET | ORAL | Status: DC | PRN

## 2016-04-07 MED ORDER — COCONUT OIL OIL: 1.0000 "application " | TOPICAL_OIL | Status: DC | PRN

## 2016-04-07 MED ORDER — BENZOCAINE-MENTHOL 20-0.5 % EX AERO
1.0000 "application " | INHALATION_SPRAY | CUTANEOUS | Status: DC | PRN
  Administered 2016-04-07: 1 via TOPICAL
  Filled 2016-04-07 (×2): qty 56

## 2016-04-07 MED ORDER — OXYCODONE-ACETAMINOPHEN 5-325 MG PO TABS
2.0000 | ORAL_TABLET | ORAL | Status: DC | PRN
  Administered 2016-04-07: 2 via ORAL
  Filled 2016-04-07: qty 2

## 2016-04-07 NOTE — Progress Notes (Signed)
Post Partum Day 0, 12 hours pp Subjective: no complaints, voiding and tolerating PO. Breast and bottle feeding. Has been having difficulty with kidney stones this pregnancy. Dr Erlene Quan wanted a CT of the abd and pelvis with contrast after delivery (in her last note) and follow up as an outpatient.  Objective: Blood pressure (!) 113/59, pulse 67, temperature 98.1 F (36.7 C), temperature source Oral, resp. rate 18, height 5\' 5"  (1.651 m), weight 74.8 kg (165 lb), last menstrual period 06/25/2015, SpO2 99 %, unknown if currently breastfeeding.  Physical Exam:  General: fatigued, no distress and sleepy Lochia: appropriate Uterine Fundus: firm DVT Evaluation: No evidence of DVT seen on physical exam.   Recent Labs  04/06/16 1839 04/07/16 0555  HGB 10.4* 10.1*  HCT 32.0* 30.7*  WBC 21.7* 30.1*  PLT 268 241    Assessment/Plan: stable s/p SVD 10 hours ago-continue postpartum care Kidney stones  CT scan of abdomen and pelvis with contrast ordered for tomorrow A POS/ RI/ VI/ TDAP UTD   LOS: 1 day   Jennifer Richmond 04/07/2016, 12:24 PM

## 2016-04-07 NOTE — Discharge Summary (Signed)
Obstetric Discharge Summary Reason for Admission: rupture of membranes Delivery Type: spontaneous vaginal delivery Postpartum Procedures: none Complications-Intrapartum or Postpartum: none  Recent Labs  04/06/16 1839 04/07/16 0555  HGB 10.4* 10.1*  HCT 32.0* 30.7*   Gestational Age at Delivery: [redacted]w[redacted]d  Antepartum complications: none Date of Delivery: 04/07/16   Delivered By: Jennifer Richmond  Physical Exam:  General: alert and cooperative Lochia: appropriate Uterine Fundus: firm Incision: N/A DVT Evaluation: No evidence of DVT seen on physical exam. Abdomen: abdomen is soft without significant tenderness, masses, organomegaly or guarding  Prenatal Labs Blood Type: A+ Rubella: Immune Varicella: Immune TDAP: Up to date and Given during pregnancy Feeding: Bottle Contraception: Plans tubal ligation  Discharge Diagnoses: Term Pregnancy-delivered  Discharge Information: Date: 04/08/2016 Activity: pelvic rest Diet: routine Medications: PNV, Ibuprofen and Percocet Condition: stable Instructions:  Discharge instructions:   Call office if you have any of the following: headache, visual changes, fever >100 F, chills, breast concerns, excessive vaginal bleeding, incision drainage or problems, leg pain or redness, depression or any other concerns.   Activity: Do not lift > 10 lbs for 6 weeks.  No intercourse or tampons for 6 weeks.  No driving for 1-2 weeks.   Discharge to: home Follow-up Information    Brothers, Jonelle Sidle, [place] Follow up in 6 week(s).   Specialty:  Certified Nurse Midwife Contact information: [place] [address] [phone]           Newborn Data: Live born female  Birth Weight: 7 lb 1.2 oz (3210 g) APGAR: 7, 9  Home with mother.  Hoyt Koch, MD 04/08/2016, 9:32 AM

## 2016-04-07 NOTE — Lactation Note (Signed)
This note was copied from a baby's chart. Lactation Consultation Note  Patient Name: Jennifer Richmond [id number] Date: 04/07/2016   Mom states she does "not know what she is doing" with feeding baby, but describes what sounds like correct breastfeeding with swallows and no soreness, etc. I gave her breastfeeding dvd to watch with her family and encouraged her to call me with next feedgng so I could continue/complete her teaching. I did answer some basic BF questions now as well.   Maternal Data    Feeding Feeding Type: Breast Fed Length of feed: 20 min  LATCH Score/Interventions                      Lactation Tools Discussed/Used     Consult Status      Roque Cash 04/07/2016, 1:56 PM

## 2016-04-07 NOTE — Progress Notes (Signed)
Patient transported by radiology tech by wheelchair for CT scan

## 2016-04-08 ENCOUNTER — Telehealth: Payer: Self-pay | Admitting: Urology

## 2016-04-08 NOTE — Anesthesia Postprocedure Evaluation (Signed)
Anesthesia Post Note  Patient: Jennifer Richmond  Procedure(s) Performed: CLE  Patient location during evaluation: Mother Baby Anesthesia Type: Epidural Level of consciousness: awake and awake and alert Pain management: pain level controlled Vital Signs Assessment: post-procedure vital signs reviewed and stable Respiratory status: spontaneous breathing Cardiovascular status: blood pressure returned to baseline Postop Assessment: no backache and no headache Anesthetic complications: no    Last Vitals:  Vitals:   04/07/16 1607 04/07/16 1958  BP: 110/71 (!) 114/56  Pulse: 65 68  Resp: 18 18  Temp: 37 C 36.7 C    Last Pain:  Vitals:   04/08/16 0420  TempSrc:   PainSc: 3                  Brantley Fling

## 2016-04-08 NOTE — Telephone Encounter (Signed)
Done ° ° °Jennifer Richmond °

## 2016-04-08 NOTE — Progress Notes (Signed)
Admit Date: 04/06/2016 Today's Date: 04/08/2016  Post Partum Day 2  Subjective:  no complaints  Objective: Temp:  [98 F (36.7 C)-98.6 F (37 C)] 98 F (36.7 C) (08/16 0729) Pulse Rate:  [55-68] 55 (08/16 0729) Resp:  [18-20] 20 (08/16 0729) BP: (110-114)/(56-74) 114/74 (08/16 0729)  Physical Exam:  General: alert and cooperative Lochia: appropriate Uterine Fundus: firm Incision: none DVT Evaluation: No evidence of DVT seen on physical exam.   Recent Labs  04/06/16 1839 04/07/16 0555  HGB 10.4* 10.1*  HCT 32.0* 30.7*    Assessment/Plan: Discharge home, Lactation consult, Contraception plans BTL and Bottle Feeding too  LOS: 2 days   [place] 04/08/2016, 9:34 AM

## 2016-04-08 NOTE — Telephone Encounter (Signed)
Looked at it.  Yes, sooner.  Please fit her in tomorrow AM on my schedule if possible.    Tell her congratulations!  [name], MD

## 2016-04-08 NOTE — Progress Notes (Signed)
D/C order from MD.  Reviewed d/c instructions and prescriptions with patient and answered any questions.  Patient d/c home with infant via wheelchair by nursing/auxillary. 

## 2016-04-08 NOTE — Discharge Instructions (Signed)
Please call your doctor or return to the ER if you experience any chest pains, shortness of breath, fever greater than 101, any heavy bleeding or large clots, and foul smelling vaginal discharge, any worsening abdominal pain & cramping that is not controlled by pain medication, or any signs of post partum depression.  No tampons, enemas, douches, or sexual intercourse for 6 weeks.  Also avoid tub baths, hot tubs, or swimming for 6 weeks.  Vaginal Delivery, Care After Refer to this sheet in the next few weeks. These discharge instructions provide you with information on caring for yourself after delivery. Your health care provider may also give you specific instructions. Your treatment has been planned according to the most current medical practices available, but problems sometimes occur. Call your health care provider if you have any problems or questions after you go home. HOME CARE INSTRUCTIONS  Take over-the-counter or prescription medicines only as directed by your health care provider or pharmacist.  Do not drink alcohol, especially if you are breastfeeding or taking medicine to relieve pain.  Do not chew or smoke tobacco.  Do not use illegal drugs.  Continue to use good perineal care. Good perineal care includes:  Wiping your perineum from front to back.  Keeping your perineum clean.  Do not use tampons or douche until your health care provider says it is okay.  Shower, wash your hair, and take tub baths as directed by your health care provider.  Wear a well-fitting bra that provides breast support.  Eat healthy foods.  Drink enough fluids to keep your urine clear or pale yellow.  Eat high-fiber foods such as whole grain cereals and breads, brown rice, beans, and fresh fruits and vegetables every day. These foods may help prevent or relieve constipation.  Follow your health care provider's recommendations regarding resumption of activities such as climbing stairs, driving,  lifting, exercising, or traveling.  Talk to your health care provider about resuming sexual activities. Resumption of sexual activities is dependent upon your risk of infection, your rate of healing, and your comfort and desire to resume sexual activity.  Try to have someone help you with your household activities and your newborn for at least a few days after you leave the hospital.  Rest as much as possible. Try to rest or take a nap when your newborn is sleeping.  Increase your activities gradually.  Keep all of your scheduled postpartum appointments. It is very important to keep your scheduled follow-up appointments. At these appointments, your health care provider will be checking to make sure that you are healing physically and emotionally. SEEK MEDICAL CARE IF:   You are passing large clots from your vagina. Save any clots to show your health care provider.  You have a foul smelling discharge from your vagina.  You have trouble urinating.  You are urinating frequently.  You have pain when you urinate.  You have a change in your bowel movements.  You have increasing redness, pain, or swelling near your vaginal incision (episiotomy) or vaginal tear.  You have pus draining from your episiotomy or vaginal tear.  Your episiotomy or vaginal tear is separating.  You have painful, hard, or reddened breasts.  You have a severe headache.  You have blurred vision or see spots.  You feel sad or depressed.  You have thoughts of hurting yourself or your newborn.  You have questions about your care, the care of your newborn, or medicines.  You are dizzy or light-headed.  You  have a rash.  You have nausea or vomiting.  You were breastfeeding and have not had a menstrual period within 12 weeks after you stopped breastfeeding.  You are not breastfeeding and have not had a menstrual period by the 12th week after delivery.  You have a fever. SEEK IMMEDIATE MEDICAL CARE IF:     You have persistent pain.  You have chest pain.  You have shortness of breath.  You faint.  You have leg pain.  You have stomach pain.  Your vaginal bleeding saturates two or more sanitary pads in 1 hour.   This information is not intended to replace advice given to you by your health care provider. Make sure you discuss any questions you have with your health care provider.   Document Released: 08/07/2000 Document Revised: 05/01/2015 Document Reviewed: 04/06/2012 Elsevier Interactive Patient Education [employer].

## 2016-04-08 NOTE — Telephone Encounter (Signed)
Patient called and said that she had her baby two days ago. While she was in the hospital she said she told them that you had ordered a CT scan and wanted to have it done. Instead they ordered one and she had it done while she was in the hospital. It looks like it was the same one you wanted her to have done. She does have a follow up appointment with you on 05-06-16. Can you review her scan and let me know if she needs to be seen sooner or if its ok to just keep her appt on the 13th.   Thanks,  Sharyn Lull

## 2016-04-09 ENCOUNTER — Ambulatory Visit (INDEPENDENT_AMBULATORY_CARE_PROVIDER_SITE_OTHER): Payer: Medicaid Other | Admitting: Urology

## 2016-04-09 ENCOUNTER — Telehealth: Payer: Self-pay | Admitting: Radiology

## 2016-04-09 ENCOUNTER — Other Ambulatory Visit: Payer: Self-pay | Admitting: Radiology

## 2016-04-09 VITALS — BP 124/87 | HR 83 | Ht 65.0 in | Wt 157.0 lb

## 2016-04-09 DIAGNOSIS — N200 Calculus of kidney: Secondary | ICD-10-CM | POA: Diagnosis not present

## 2016-04-09 DIAGNOSIS — N201 Calculus of ureter: Secondary | ICD-10-CM

## 2016-04-09 DIAGNOSIS — R109 Unspecified abdominal pain: Secondary | ICD-10-CM | POA: Diagnosis not present

## 2016-04-09 MED ORDER — HYDROCODONE-ACETAMINOPHEN 5-325 MG PO TABS: 1.0000 | ORAL_TABLET | Freq: Four times a day (QID) | ORAL | 0 refills | Status: DC | PRN

## 2016-04-09 NOTE — Telephone Encounter (Signed)
LMOM. Need to notify pt of surgery information. 

## 2016-04-09 NOTE — Telephone Encounter (Signed)
Notified pt of surgery scheduled with Dr Erlene Quan on 04/28/16, pre-admit testing phone interview on 04/21/16 between 9am-1pm & to call day prior to surgery for arrival time to [facility]. Pt voices understanding.

## 2016-04-09 NOTE — Progress Notes (Signed)
04/09/2016 3:03 PM   Jennifer Richmond 12-02-1985 [id number]  Referring provider: Kathrine Haddock, NP [place], [place] [address]  Chief Complaint  Patient presents with  . Nephrolithiasis    Ctscan results    HPI: 30 year old female who presents today approximately 3 days postpartum after delivering a healthy baby boy.  Her pregnancy was complicated by admission for flank pain during which she passed some small calculi. She presented later in her pregnancy to our clinic at which time she was 33 and 0 weeks  with right flank pain which was ongoing. Renal ultrasound the time showed right-sided hydronephrosis and nonobstructing right-sided calculi.  She ultimately delivered at 37 weeks with an uncomplicated delivery. During her immediate postpartum phase, she underwent noncontrast CT scan which demonstrated a 6 mm obstructing right distal UVJ stone. She also some small nonobstructing calculi bilaterally. She continues to have bouts of right-sided flank pain without fevers, chills, nausea or vomiting. Her urinary frequency is also improving since delivery.  She is accompanied to the office today by her newborn and older son.  PMH: Past Medical History:  Diagnosis Date  . Abdominal pain   . Anxiety   . Asthma   . Back pain   . Bipolar disorder ([place])   . GERD (gastroesophageal reflux disease)   . Gross hematuria   . Kidney stones   . Panic attacks   . PTSD (post-traumatic stress disorder)   . Urinary hesitancy   . UTI (urinary tract infection)     Surgical History: Past Surgical History:  Procedure Laterality Date  . DILATION AND CURETTAGE OF UTERUS    . LAPAROSCOPIC OVARIAN CYSTECTOMY      Home Medications:    Medication List       Accurate as of 04/09/16  3:03 PM. Always use your most recent med list.          HYDROcodone-acetaminophen 5-325 MG tablet Commonly known as:  NORCO/VICODIN Take 1-2 tablets by mouth every 6 (six) hours as needed for moderate pain.     prenatal multivitamin Tabs tablet Take 1 tablet by mouth daily at 12 noon.       Allergies:  Allergies  Allergen Reactions  . Shrimp [Shellfish Allergy]     Family History: Family History  Problem Relation Age of Onset  . Diabetes Father   . Kidney nephrosis Father   . Seizures Mother     Social History:  reports that she has been smoking Cigarettes.  She has been smoking about 0.25 packs per day for the past 0.00 years. She has never used smokeless tobacco. She reports that she does not drink alcohol or use drugs.  ROS: UROLOGY Frequent Urination?: Yes Hard to postpone urination?: No Burning/pain with urination?: No Get up at night to urinate?: Yes Leakage of urine?: No Urine stream starts and stops?: No Trouble starting stream?: No Do you have to strain to urinate?: No Blood in urine?: Yes Urinary tract infection?: No Sexually transmitted disease?: No Injury to kidneys or bladder?: No Painful intercourse?: No Weak stream?: No Currently pregnant?: No Vaginal bleeding?: No Last menstrual period?: n  Gastrointestinal Nausea?: Yes Vomiting?: No Indigestion/heartburn?: No Diarrhea?: No Constipation?: No  Constitutional Fever: No Night sweats?: No Weight loss?: No Fatigue?: No  Skin Skin rash/lesions?: No Itching?: No  Eyes Blurred vision?: No Double vision?: No  Ears/Nose/Throat Sore throat?: No Sinus problems?: No  Hematologic/Lymphatic Swollen glands?: No Easy bruising?: No  Cardiovascular Leg swelling?: No Chest pain?: No  Respiratory  Cough?: No Shortness of breath?: No  Endocrine Excessive thirst?: No  Musculoskeletal Back pain?: No Joint pain?: No  Neurological Headaches?: No Dizziness?: No  Psychologic Depression?: No Anxiety?: No  Physical Exam: BP 124/87   Pulse 83   Ht 5\' 5"  (1.651 m)   Wt 157 lb (71.2 kg)   LMP 06/25/2015 (Exact Date)   Breastfeeding? Yes   BMI 26.13 kg/m   Constitutional:  Alert and  oriented, No acute distress. Well appearing. HEENT: [place] AT, moist mucus membranes.  Trachea midline, no masses. Cardiovascular: No clubbing, cyanosis, or edema.  RRR. Respiratory: Normal respiratory effort, no increased work of breathing. CTAB. GI: Nontender.   Skin: No rashes, bruises or suspicious lesions. Neurologic: Grossly intact, no focal deficits, moving all 4 extremities. Psychiatric: Normal mood and affect.  Laboratory Data: Lab Results  Component Value Date   WBC 30.1 (H) 04/07/2016   HGB 10.1 (L) 04/07/2016   HCT 30.7 (L) 04/07/2016   MCV 81.2 04/07/2016   PLT 241 04/07/2016    Lab Results  Component Value Date   CREATININE 0.82 09/04/2015    Pertinent Imaging: udy Result   CLINICAL DATA:  RIGHT-sided kidney stone.  24 hours postpartum.  EXAM: CT ABDOMEN AND PELVIS WITHOUT CONTRAST  TECHNIQUE: Multidetector CT imaging of the abdomen and pelvis was performed following the standard protocol without IV contrast.  COMPARISON:  CT 04/19/2013  FINDINGS: Lower chest: Lung bases are clear.  Hepatobiliary: No focal hepatic lesion. No biliary duct dilatation. Gallbladder is normal. Common bile duct is normal.  Pancreas: Pancreas is normal. No ductal dilatation. No pancreatic inflammation.  Spleen: Normal spleen  Adrenals/urinary tract: 3 small nonobstructing calculi in lower pole of the RIGHT kidney. No RIGHT hydronephrosis. Mild dilatation RIGHT renal pelvis versus extrarenal pelvis. Large calculus in the distal RIGHT ureter measuring 6 mm at the vesicoureteral junction (image 79, series 2). No significant hydroureter on the RIGHT.  Four nonobstructing LEFT renal calculi are noted. No LEFT ureterolithiasis.  Small locule gas within the bladder likely related to catheterization.  Stomach/Bowel: Stomach, small bowel, appendix, and cecum are normal. The colon and rectosigmoid colon are normal.  Vascular/Lymphatic: Abdominal aorta is normal  caliber. There is no retroperitoneal or periportal lymphadenopathy. No pelvic lymphadenopathy.  Reproductive: Enlarged uterus consistent with postpartum state. There is high-density material in the lower uterine segment.  Other: No free fluid.  Musculoskeletal: No aggressive osseous lesion.  IMPRESSION: 1. Minimally obstructing calculus in the distal RIGHT ureter at the vesicoureteral junction. Calculus is relatively large at 6 mm. 2. Bilateral nonobstructing renal calculi. 3. No significant hydronephrosis on the LEFT or RIGHT. 4. Post partum uterus with blood within the lower uterine segment.   Electronically Signed   By: Suzy Bouchard M.D.   On: 04/07/2016 15:02    CT scan was reviewed personally today and with the patient.  Assessment & Plan:   1. Right ureteral calculus 6 mm obstructing right UVJ stone with proximal hydroureteronephrosis, intermittent flank pain. We reviewed warning symptoms and indications for urgent intervention/assessment.  We discussed various treatment options including ESWL vs. ureteroscopy, laser lithotripsy, and stent. We discussed the risks and benefits of both including bleeding, infection, damage to surrounding structures, efficacy with need for possible further intervention, and need for temporary ureteral stent.  She is most interested in right ureteroscopy, laser lithotripsy which will be scheduled in the near future. All of her questions were answered.  We did discuss the risk of general anesthesia in the setting of  breast-feeding. This will be addressed further with the anesthesia group but I have advised her to attempt to pump and advance and store some breastmilk as she would likely be advised to pump and dump in the immediate postoperative period.  Preop urine culture.  2. Right flank pain Prescribed Vicodin dispense #20 today as needed.  3. Nephrolithiasis Small bilateral nonobstructing stones. These are all very tiny and  possible spontaneously.    She was previously on potassium citrate in the past. We will  readress at her follow-up visit.  - CULTURE, URINE COMPREHENSIVE    Schedule right ureteroscopy, laser lithotripsy, right ureteral stent placement  [name], MD  [facility] [address] [place], [place] [address] [phone]

## 2016-04-12 LAB — CULTURE, URINE COMPREHENSIVE

## 2016-04-21 ENCOUNTER — Encounter
Admission: RE | Admit: 2016-04-21 | Discharge: 2016-04-21 | Disposition: A | Discharge status: Home / Self Care | Payer: Medicaid Other | Source: Ambulatory Visit | Attending: Urology | Admitting: Urology

## 2016-04-21 NOTE — Patient Instructions (Signed)
  Your procedure is scheduled on: 04/28/16 Report to Day Surgery. To find out your arrival time please call [phone] between 1PM - 3PM on 05/05/16.  Remember: Instructions that are not followed completely may result in serious medical risk, up to and including death, or upon the discretion of your surgeon and anesthesiologist your surgery may need to be rescheduled.    _X___ 1. Do not eat food or drink liquids after midnight. No gum chewing or hard candies.     __X__ 2. No Alcohol for 24 hours before or after surgery.   __X__ 3. Do Not Smoke For 24 Hours Prior to Your Surgery.   ____ 4. Bring all medications with you on the day of surgery if instructed.    _X___ 5. Notify your doctor if there is any change in your medical condition     (cold, fever, infections).       Do not wear jewelry, make-up, hairpins, clips or nail polish.  Do not wear lotions, powders, or perfumes. You may wear deodorant.  Do not shave 48 hours prior to surgery. Men may shave face and neck.  Do not bring valuables to the hospital.    [facility] is not responsible for any belongings or valuables.               Contacts, dentures or bridgework may not be worn into surgery.  Leave your suitcase in the car. After surgery it may be brought to your room.  For patients admitted to the hospital, discharge time is determined by your                treatment team.   Patients discharged the day of surgery will not be allowed to drive home.   Please read over the following fact sheets that you were given:   Surgical Site Infection Prevention   ____ Take these medicines the morning of surgery with A SIP OF WATER:    1. NONE  2.   3.   4.  5.  6.  ____ Fleet Enema (as directed)   ____ Use CHG Soap as directed  ____ Use inhalers on the day of surgery  ____ Stop metformin 2 days prior to surgery    ____ Take 1/2 of usual insulin dose the night before surgery and none on the morning of surgery.   ____  Stop Coumadin/Plavix/aspirin on  ____ Stop Anti-inflammatories on   ____ Stop supplements until after surgery.    ____ Bring C-Pap to the hospital.   PATIENT STATES SHE HAS BEEN INSTRUCTED HOW LONG AFTER SURGERY TO PUMP AND DISCARD BREASTMILK

## 2016-04-28 ENCOUNTER — Other Ambulatory Visit: Payer: Self-pay | Admitting: Radiology

## 2016-04-28 ENCOUNTER — Encounter: Payer: Self-pay | Admitting: *Deleted

## 2016-04-28 ENCOUNTER — Ambulatory Visit
Admission: RE | Admit: 2016-04-28 | Discharge: 2016-04-28 | Disposition: A | Discharge status: Home / Self Care | Payer: Medicaid Other | Source: Ambulatory Visit | Attending: Urology | Admitting: Urology

## 2016-04-28 ENCOUNTER — Encounter
Admission: RE | Disposition: A | Discharge status: Home / Self Care | Payer: Self-pay | Source: Ambulatory Visit | Attending: Urology

## 2016-04-28 DIAGNOSIS — N200 Calculus of kidney: Secondary | ICD-10-CM

## 2016-04-28 SURGERY — URETEROSCOPY, WITH LITHOTRIPSY USING HOLMIUM LASER
Anesthesia: Choice | Laterality: Right

## 2016-04-28 MED ORDER — CEFAZOLIN SODIUM-DEXTROSE 2-4 GM/100ML-% IV SOLN: 2.0000 g | INTRAVENOUS | Status: DC

## 2016-04-28 MED ORDER — LACTATED RINGERS IV SOLN: INTRAVENOUS | Status: DC

## 2016-04-28 MED ORDER — FAMOTIDINE 20 MG PO TABS: 20.0000 mg | ORAL_TABLET | Freq: Once | ORAL | Status: DC

## 2016-04-28 MED ORDER — CEFAZOLIN SODIUM-DEXTROSE 2-4 GM/100ML-% IV SOLN
INTRAVENOUS | Status: AC
  Filled 2016-04-28: qty 100

## 2016-04-28 MED ORDER — FAMOTIDINE 20 MG PO TABS
ORAL_TABLET | ORAL | Status: AC
  Filled 2016-04-28: qty 1

## 2016-04-28 SURGICAL SUPPLY — 33 items
ADAPTER SCOPE UROLOK II (MISCELLANEOUS) IMPLANT
BAG DRAIN CYSTO-URO LG1000N (MISCELLANEOUS) ×3 IMPLANT
BASKET ZERO TIP 1.9FR (BASKET) IMPLANT
CATH FOL 2WAY LX 16X5 (CATHETERS) IMPLANT
CATH URETL 5X70 OPEN END (CATHETERS) ×3 IMPLANT
CNTNR SPEC 2.5X3XGRAD LEK (MISCELLANEOUS) ×1
CONRAY 43 FOR UROLOGY 50M (MISCELLANEOUS) ×3 IMPLANT
CONT SPEC 4OZ STER OR WHT (MISCELLANEOUS) ×2
CONTAINER SPEC 2.5X3XGRAD LEK (MISCELLANEOUS) ×1 IMPLANT
DRAPE UTILITY 15X26 TOWEL STRL (DRAPES) ×3 IMPLANT
FIBER LASER LITHO 273 (Laser) IMPLANT
GLOVE BIO SURGEON STRL SZ 6.5 (GLOVE) ×2 IMPLANT
GLOVE BIO SURGEONS STRL SZ 6.5 (GLOVE) ×1
GOWN STRL REUS W/ TWL LRG LVL3 (GOWN DISPOSABLE) ×2 IMPLANT
GOWN STRL REUS W/ TWL LRG LVL4 (GOWN DISPOSABLE) ×2 IMPLANT
GOWN STRL REUS W/TWL LRG LVL3 (GOWN DISPOSABLE) ×4
GOWN STRL REUS W/TWL LRG LVL4 (GOWN DISPOSABLE) ×4
GUIDEWIRE SUPER STIFF (WIRE) IMPLANT
HOLDER FOLEY CATH W/STRAP (MISCELLANEOUS) IMPLANT
INTRODUCER DILATOR DOUBLE (INTRODUCER) IMPLANT
KIT RM TURNOVER CYSTO AR (KITS) ×3 IMPLANT
PACK CYSTO AR (MISCELLANEOUS) ×3 IMPLANT
SENSORWIRE 0.038 NOT ANGLED (WIRE)
SET CYSTO W/LG BORE CLAMP LF (SET/KITS/TRAYS/PACK) ×3 IMPLANT
SHEATH URETERAL 12FRX35CM (MISCELLANEOUS) IMPLANT
SOL .9 NS 3000ML IRR  AL (IV SOLUTION) ×2
SOL .9 NS 3000ML IRR UROMATIC (IV SOLUTION) ×1 IMPLANT
STENT URET 6FRX24 CONTOUR (STENTS) IMPLANT
STENT URET 6FRX26 CONTOUR (STENTS) IMPLANT
SURGILUBE 2OZ TUBE FLIPTOP (MISCELLANEOUS) ×3 IMPLANT
SYRINGE IRR TOOMEY STRL 70CC (SYRINGE) ×3 IMPLANT
WATER STERILE IRR 1000ML POUR (IV SOLUTION) ×3 IMPLANT
WIRE SENSOR 0.038 NOT ANGLED (WIRE) IMPLANT

## 2016-04-28 NOTE — OR Nursing (Signed)
Dr Andree Elk notified of pt eating bacon biscuit at 6:30 am - case cancelled . Dr Erlene Quan notified. Pt instructed to call office to reschedule.

## 2016-04-28 NOTE — Telephone Encounter (Signed)
Pt states she ate breakfast prior to surgery so it needs to be rescheduled. Notified pt of surgery rescheduled to 05/11/16 & to call Friday prior to surgery for arrival time to [facility]. Advised pt to follow instructions given by pre-admit testing & nothing to eat or drink after mn. Pt voices understanding.

## 2016-05-06 ENCOUNTER — Other Ambulatory Visit: Payer: Medicaid Other | Admitting: Urology

## 2016-05-06 ENCOUNTER — Ambulatory Visit: Payer: Medicaid Other | Admitting: Urology

## 2016-05-08 ENCOUNTER — Encounter: Payer: Self-pay | Admitting: *Deleted

## 2016-05-08 DIAGNOSIS — N200 Calculus of kidney: Secondary | ICD-10-CM | POA: Diagnosis not present

## 2016-05-08 DIAGNOSIS — Z79899 Other long term (current) drug therapy: Secondary | ICD-10-CM | POA: Diagnosis not present

## 2016-05-08 DIAGNOSIS — J45909 Unspecified asthma, uncomplicated: Secondary | ICD-10-CM | POA: Diagnosis not present

## 2016-05-08 DIAGNOSIS — F17210 Nicotine dependence, cigarettes, uncomplicated: Secondary | ICD-10-CM | POA: Diagnosis not present

## 2016-05-08 DIAGNOSIS — R109 Unspecified abdominal pain: Secondary | ICD-10-CM | POA: Diagnosis present

## 2016-05-08 LAB — COMPREHENSIVE METABOLIC PANEL
ALK PHOS: 82 U/L (ref 38–126)
ALT: 12 U/L — ABNORMAL LOW (ref 14–54)
ANION GAP: 5 (ref 5–15)
AST: 16 U/L (ref 15–41)
Albumin: 3.5 g/dL (ref 3.5–5.0)
BILIRUBIN TOTAL: 0.3 mg/dL (ref 0.3–1.2)
BUN: 11 mg/dL (ref 6–20)
CALCIUM: 8.9 mg/dL (ref 8.9–10.3)
CO2: 24 mmol/L (ref 22–32)
Chloride: 109 mmol/L (ref 101–111)
Creatinine, Ser: 0.92 mg/dL (ref 0.44–1.00)
GLUCOSE: 109 mg/dL — AB (ref 65–99)
Potassium: 4 mmol/L (ref 3.5–5.1)
Sodium: 138 mmol/L (ref 135–145)
TOTAL PROTEIN: 6.4 g/dL — AB (ref 6.5–8.1)

## 2016-05-08 LAB — URINALYSIS COMPLETE WITH MICROSCOPIC (ARMC ONLY)
BACTERIA UA: NONE SEEN
BILIRUBIN URINE: NEGATIVE
Glucose, UA: NEGATIVE mg/dL
KETONES UR: NEGATIVE mg/dL
Leukocytes, UA: NEGATIVE
NITRITE: NEGATIVE
PH: 5 (ref 5.0–8.0)
PROTEIN: 30 mg/dL — AB
SPECIFIC GRAVITY, URINE: 1.028 (ref 1.005–1.030)

## 2016-05-08 LAB — CBC
HCT: 40.6 % (ref 35.0–47.0)
HEMOGLOBIN: 13.2 g/dL (ref 12.0–16.0)
MCH: 26.7 pg (ref 26.0–34.0)
MCHC: 32.5 g/dL (ref 32.0–36.0)
MCV: 82.2 fL (ref 80.0–100.0)
Platelets: 271 10*3/uL (ref 150–440)
RBC: 4.94 MIL/uL (ref 3.80–5.20)
RDW: 17.4 % — ABNORMAL HIGH (ref 11.5–14.5)
WBC: 10.2 10*3/uL (ref 3.6–11.0)

## 2016-05-08 LAB — POCT PREGNANCY, URINE: PREG TEST UR: NEGATIVE

## 2016-05-08 LAB — LIPASE, BLOOD: Lipase: 17 U/L (ref 11–51)

## 2016-05-08 MED ORDER — ONDANSETRON 4 MG PO TBDP: 4.0000 mg | ORAL_TABLET | Freq: Once | ORAL | Status: DC | PRN

## 2016-05-08 MED ORDER — PANTOPRAZOLE SODIUM 40 MG PO TBEC: 40.0000 mg | DELAYED_RELEASE_TABLET | Freq: Once | ORAL | Status: DC

## 2016-05-08 MED ORDER — ONDANSETRON 4 MG PO TBDP
ORAL_TABLET | ORAL | Status: AC
  Filled 2016-05-08: qty 1

## 2016-05-08 NOTE — ED Triage Notes (Signed)
Pt states she is to have lithotripsy on Monday, took last of pain meds today. Pt states she has vomited x 2 since 1800 today. Pt c/o dysuria and R flank pain.

## 2016-05-09 ENCOUNTER — Emergency Department
Admission: EM | Admit: 2016-05-09 | Discharge: 2016-05-09 | Disposition: A | Discharge status: Home / Self Care | Payer: Medicaid Other | Attending: Emergency Medicine | Admitting: Emergency Medicine

## 2016-05-09 DIAGNOSIS — N200 Calculus of kidney: Secondary | ICD-10-CM

## 2016-05-09 DIAGNOSIS — R109 Unspecified abdominal pain: Secondary | ICD-10-CM

## 2016-05-09 MED ORDER — OXYCODONE-ACETAMINOPHEN 5-325 MG PO TABS: 1.0000 | ORAL_TABLET | Freq: Four times a day (QID) | ORAL | 0 refills | Status: DC | PRN

## 2016-05-09 MED ORDER — ONDANSETRON HCL 4 MG/2ML IJ SOLN
4.0000 mg | Freq: Once | INTRAMUSCULAR | Status: AC
  Administered 2016-05-09: 4 mg via INTRAVENOUS
  Filled 2016-05-09: qty 2

## 2016-05-09 MED ORDER — ONDANSETRON 4 MG PO TBDP: 4.0000 mg | ORAL_TABLET | Freq: Three times a day (TID) | ORAL | 0 refills | Status: DC | PRN

## 2016-05-09 MED ORDER — MORPHINE SULFATE (PF) 4 MG/ML IV SOLN
4.0000 mg | Freq: Once | INTRAVENOUS | Status: AC
  Administered 2016-05-09: 4 mg via INTRAVENOUS
  Filled 2016-05-09: qty 1

## 2016-05-09 MED ORDER — OXYCODONE-ACETAMINOPHEN 5-325 MG PO TABS
1.0000 | ORAL_TABLET | Freq: Once | ORAL | Status: AC
  Administered 2016-05-09: 1 via ORAL
  Filled 2016-05-09: qty 1

## 2016-05-09 NOTE — ED Notes (Signed)
Pt awoken from sleep to reassess. Pt state she continues to have pain. md notified.

## 2016-05-09 NOTE — ED Notes (Signed)
Blankets provided, lights dimmed for comfort.

## 2016-05-09 NOTE — ED Provider Notes (Signed)
[facility] Emergency Department Provider Note   ____________________________________________   First MD Initiated Contact with Patient 05/09/16 [phone]     (approximate)  I have reviewed the triage vital signs and the nursing notes.   HISTORY  Chief Complaint Flank Pain    HPI Jennifer Richmond is a 30 y.o. female who comes into the hospital today with pain. She reports that she has a kidney stone that some known and 8 mm in the right side. She reports that she has a planned lithotripsy on Monday. The patient reports that it has moved into the ureter and causing pain. The patient had surgery scheduled previously but it was rescheduled. The patient ran out of her pain medicine around 3 PM. She reports that she couldn't handle the pain anymore this evening. She's had some decreased urine output as well as some difficulty urinating today which was concerning. The patient reports her back pain a 7 out of 10 in intensity but it hurts when she urinates. The patient has been taking hydrocodone as well as oxycodone for breakthrough at home. The patient is here for evaluation today. He has not had any fevers and denies any other abdominal pain.   Past Medical History:  Diagnosis Date  . Abdominal pain   . Anxiety   . Asthma   . Back pain   . Bipolar disorder ([place])   . GERD (gastroesophageal reflux disease)   . Gross hematuria   . Kidney stones   . Panic attacks   . Panic attacks   . PTSD (post-traumatic stress disorder)   . Urinary hesitancy   . UTI (urinary tract infection)     Patient Active Problem List   Diagnosis Date Noted  . Labor and delivery, indication for care 04/06/2016  . Nausea and vomiting during pregnancy 03/11/2016  . Flank pain 03/05/2016  . Abdominal pain affecting pregnancy 02/25/2016  . Nephrolithiasis 02/25/2016    Past Surgical History:  Procedure Laterality Date  . DILATION AND CURETTAGE OF UTERUS    . LAPAROSCOPIC OVARIAN  CYSTECTOMY      Prior to Admission medications   Medication Sig Start Date End Date Taking? Authorizing Provider  clonazePAM (KLONOPIN) 1 MG tablet Take 1 mg by mouth daily.    Historical Provider, MD  HYDROcodone-acetaminophen (NORCO/VICODIN) 5-325 MG tablet Take 1-2 tablets by mouth every 6 (six) hours as needed for moderate pain. 04/09/16   [name], MD  ondansetron (ZOFRAN ODT) 4 MG disintegrating tablet Take 1 tablet (4 mg total) by mouth every 8 (eight) hours as needed for nausea or vomiting. 05/09/16   Loney Hering, MD  oxyCODONE-acetaminophen (ROXICET) 5-325 MG tablet Take 1 tablet by mouth every 6 (six) hours as needed. 05/09/16   Loney Hering, MD  Prenatal Vit-Fe Fumarate-FA (PRENATAL MULTIVITAMIN) TABS tablet Take 1 tablet by mouth daily at 12 noon.    Historical Provider, MD    Allergies Shrimp [shellfish allergy]  Family History  Problem Relation Age of Onset  . Diabetes Father   . Kidney nephrosis Father   . Seizures Mother     Social History Social History  Substance Use Topics  . Smoking status: Current Every Day Smoker    Packs/day: 0.25    Years: 0.00    Types: Cigarettes  . Smokeless tobacco: Never Used  . Alcohol use No    Review of Systems Constitutional: No fever/chills Eyes: No visual changes. ENT: No sore throat. Cardiovascular: Denies chest pain. Respiratory: Denies  shortness of breath. Gastrointestinal: No abdominal pain.  No nausea, no vomiting.  No diarrhea.  No constipation. Genitourinary:  dysuria. Musculoskeletal: Right flank pain Skin: Negative for rash. Neurological: Negative for headaches, focal weakness or numbness.  10-point ROS otherwise negative.  ____________________________________________   PHYSICAL EXAM:  VITAL SIGNS: ED Triage Vitals  Enc Vitals Group     BP 05/08/16 2258 114/68     Pulse Rate 05/08/16 2258 (!) 106     Resp 05/08/16 2258 18     Temp 05/08/16 2258 98 F (36.7 C)     Temp Source 05/08/16  2258 Oral     SpO2 05/08/16 2258 97 %     Weight 05/08/16 2259 150 lb (68 kg)     Height 05/08/16 2259 5\' 5"  (1.651 m)     Head Circumference --      Peak Flow --      Pain Score 05/08/16 2259 8     Pain Loc --      Pain Edu? --      Excl. in [place]? --     Constitutional: Alert and oriented. Well appearing and in Moderate distress. Eyes: Conjunctivae are normal. PERRL. EOMI. Head: Atraumatic. Nose: No congestion/rhinnorhea. Mouth/Throat: Mucous membranes are moist.  Oropharynx non-erythematous. Cardiovascular: Normal rate, regular rhythm. Grossly normal heart sounds.  Good peripheral circulation. Respiratory: Normal respiratory effort.  No retractions. Lungs CTAB. Gastrointestinal: Soft and nontender. No distention. Positive CVA tenderness to palpation Musculoskeletal: No lower extremity tenderness nor edema.   Neurologic:  Normal speech and language.  Skin:  Skin is warm, dry and intact.  Psychiatric: Mood and affect are normal.   ____________________________________________   LABS (all labs ordered are listed, but only abnormal results are displayed)  Labs Reviewed  URINALYSIS COMPLETEWITH MICROSCOPIC ([place]) - Abnormal; Notable for the following:       Result Value   Color, Urine YELLOW (*)    APPearance HAZY (*)    Hgb urine dipstick 2+ (*)    Protein, ur 30 (*)    Squamous Epithelial / LPF 6-30 (*)    All other components within normal limits  COMPREHENSIVE METABOLIC PANEL - Abnormal; Notable for the following:    Glucose, Bld 109 (*)    Total Protein 6.4 (*)    ALT 12 (*)    All other components within normal limits  CBC - Abnormal; Notable for the following:    RDW 17.4 (*)    All other components within normal limits  LIPASE, BLOOD  POCT PREGNANCY, URINE  POC URINE PREG, ED    ____________________________________________  EKG  None ____________________________________________  RADIOLOGY  None ____________________________________________   PROCEDURES  Procedure(s) performed: None  Procedures  Critical Care performed: No  ____________________________________________   INITIAL IMPRESSION / ASSESSMENT AND PLAN / ED COURSE  Pertinent labs & imaging results that were available during my care of the patient were reviewed by me and considered in my medical decision making (see chart for details).  This is a 30 year old female who comes into the hospital today with a known kidney stone. The patient reports that she has a lithotripsy scheduled this week. I did give the patient a dose of morphine as well as some Zofran for her pain and nausea. After the medication she was sleeping comfortably without any difficulty. She reports that she just could not tolerate and wait until Monday without any medicine for pain at home. After the medication the initial dose the patient reports that she thinks  she could go home as her pain is improved. I did not repeat her imaging studies but her urinalysis does not show any infection. The patient does not have any significantly elevated white blood cell count. She will be discharged home to follow-up for her lithotripsy on Monday.  Clinical Course     ____________________________________________   FINAL CLINICAL IMPRESSION(S) / ED DIAGNOSES  Final diagnoses:  Nephrolithiasis  Kidney stone  Flank pain      NEW MEDICATIONS STARTED DURING THIS VISIT:  Discharge Medication List as of 05/09/2016  5:36 AM    START taking these medications   Details  ondansetron (ZOFRAN ODT) 4 MG disintegrating tablet Take 1 tablet (4 mg total) by mouth every 8 (eight) hours as needed for nausea or vomiting., Starting Sat 05/09/2016, Print    oxyCODONE-acetaminophen (ROXICET) 5-325 MG tablet Take 1 tablet by mouth every 6 (six)  hours as needed., Starting Sat 05/09/2016, Print         Note:  This document was prepared using Dragon voice recognition software and may include unintentional dictation errors.    Loney Hering, MD 05/09/16 [phone]

## 2016-05-11 ENCOUNTER — Ambulatory Visit: Payer: Medicaid Other | Admitting: Certified Registered Nurse Anesthetist

## 2016-05-11 ENCOUNTER — Ambulatory Visit
Admission: RE | Admit: 2016-05-11 | Discharge: 2016-05-11 | Disposition: A | Discharge status: Home / Self Care | Payer: Medicaid Other | Source: Ambulatory Visit | Attending: Urology | Admitting: Urology

## 2016-05-11 ENCOUNTER — Encounter
Admission: RE | Disposition: A | Discharge status: Home / Self Care | Payer: Self-pay | Source: Ambulatory Visit | Attending: Urology

## 2016-05-11 DIAGNOSIS — F17210 Nicotine dependence, cigarettes, uncomplicated: Secondary | ICD-10-CM | POA: Insufficient documentation

## 2016-05-11 DIAGNOSIS — N200 Calculus of kidney: Secondary | ICD-10-CM

## 2016-05-11 DIAGNOSIS — N201 Calculus of ureter: Secondary | ICD-10-CM | POA: Diagnosis not present

## 2016-05-11 DIAGNOSIS — N3289 Other specified disorders of bladder: Secondary | ICD-10-CM | POA: Insufficient documentation

## 2016-05-11 DIAGNOSIS — D494 Neoplasm of unspecified behavior of bladder: Secondary | ICD-10-CM | POA: Diagnosis not present

## 2016-05-11 HISTORY — PX: CYSTOSCOPY WITH BIOPSY: SHX5122

## 2016-05-11 HISTORY — PX: URETEROSCOPY: SHX842

## 2016-05-11 LAB — POCT PREGNANCY, URINE: PREG TEST UR: NEGATIVE

## 2016-05-11 SURGERY — URETEROSCOPY
Anesthesia: General | Site: Ureter | Laterality: Right | Wound class: Clean Contaminated

## 2016-05-11 MED ORDER — FAMOTIDINE 20 MG PO TABS
ORAL_TABLET | ORAL | Status: AC
  Administered 2016-05-11: 20 mg
  Filled 2016-05-11: qty 1

## 2016-05-11 MED ORDER — FENTANYL CITRATE (PF) 100 MCG/2ML IJ SOLN
INTRAMUSCULAR | Status: DC | PRN
  Administered 2016-05-11 (×2): 100 ug via INTRAVENOUS

## 2016-05-11 MED ORDER — KETAMINE HCL 50 MG/ML IJ SOLN
INTRAMUSCULAR | Status: DC | PRN
  Administered 2016-05-11: 50 mg via INTRAVENOUS

## 2016-05-11 MED ORDER — GLYCOPYRROLATE 0.2 MG/ML IJ SOLN
INTRAMUSCULAR | Status: DC | PRN
  Administered 2016-05-11: 0.2 mg via INTRAVENOUS

## 2016-05-11 MED ORDER — LACTATED RINGERS IV SOLN
INTRAVENOUS | Status: DC
  Administered 2016-05-11: 09:00:00 via INTRAVENOUS

## 2016-05-11 MED ORDER — CEFAZOLIN SODIUM-DEXTROSE 2-4 GM/100ML-% IV SOLN
INTRAVENOUS | Status: AC
  Administered 2016-05-11: 2 g via INTRAVENOUS
  Filled 2016-05-11: qty 100

## 2016-05-11 MED ORDER — FAMOTIDINE 20 MG PO TABS: 20.0000 mg | ORAL_TABLET | Freq: Once | ORAL | Status: DC

## 2016-05-11 MED ORDER — FENTANYL CITRATE (PF) 100 MCG/2ML IJ SOLN: 25.0000 ug | INTRAMUSCULAR | Status: DC | PRN

## 2016-05-11 MED ORDER — CEFAZOLIN SODIUM-DEXTROSE 2-4 GM/100ML-% IV SOLN
2.0000 g | INTRAVENOUS | Status: AC
  Administered 2016-05-11: 2 g via INTRAVENOUS

## 2016-05-11 MED ORDER — ONDANSETRON HCL 4 MG/2ML IJ SOLN
INTRAMUSCULAR | Status: DC | PRN
  Administered 2016-05-11: 4 mg via INTRAVENOUS

## 2016-05-11 MED ORDER — ONDANSETRON HCL 4 MG/2ML IJ SOLN: 4.0000 mg | Freq: Once | INTRAMUSCULAR | Status: DC | PRN

## 2016-05-11 MED ORDER — LIDOCAINE HCL (CARDIAC) 20 MG/ML IV SOLN
INTRAVENOUS | Status: DC | PRN
  Administered 2016-05-11: 100 mg via INTRAVENOUS

## 2016-05-11 MED ORDER — PROPOFOL 10 MG/ML IV BOLUS
INTRAVENOUS | Status: DC | PRN
  Administered 2016-05-11: 150 mg via INTRAVENOUS

## 2016-05-11 MED ORDER — MIDAZOLAM HCL 2 MG/2ML IJ SOLN
INTRAMUSCULAR | Status: DC | PRN
  Administered 2016-05-11: 2 mg via INTRAVENOUS

## 2016-05-11 SURGICAL SUPPLY — 32 items
ADAPTER SCOPE UROLOK II (MISCELLANEOUS) IMPLANT
BAG DRAIN CYSTO-URO LG1000N (MISCELLANEOUS) ×5 IMPLANT
BASKET ZERO TIP 1.9FR (BASKET) IMPLANT
CATH FOL 2WAY LX 16X5 (CATHETERS) IMPLANT
CATH URETL 5X70 OPEN END (CATHETERS) ×5 IMPLANT
CNTNR SPEC 2.5X3XGRAD LEK (MISCELLANEOUS) ×3
CONRAY 43 FOR UROLOGY 50M (MISCELLANEOUS) ×5 IMPLANT
CONT SPEC 4OZ STER OR WHT (MISCELLANEOUS) ×2
CONTAINER SPEC 2.5X3XGRAD LEK (MISCELLANEOUS) ×3 IMPLANT
DRAPE UTILITY 15X26 TOWEL STRL (DRAPES) ×5 IMPLANT
GLOVE BIO SURGEON STRL SZ 6.5 (GLOVE) ×4 IMPLANT
GLOVE BIO SURGEONS STRL SZ 6.5 (GLOVE) ×1
GOWN STRL REUS W/ TWL LRG LVL3 (GOWN DISPOSABLE) ×6 IMPLANT
GOWN STRL REUS W/ TWL LRG LVL4 (GOWN DISPOSABLE) ×6 IMPLANT
GOWN STRL REUS W/TWL LRG LVL3 (GOWN DISPOSABLE) ×4
GOWN STRL REUS W/TWL LRG LVL4 (GOWN DISPOSABLE) ×4
GUIDEWIRE SUPER STIFF (WIRE) ×5 IMPLANT
HOLDER FOLEY CATH W/STRAP (MISCELLANEOUS) IMPLANT
INTRODUCER DILATOR DOUBLE (INTRODUCER) IMPLANT
KIT RM TURNOVER CYSTO AR (KITS) ×5 IMPLANT
PACK CYSTO AR (MISCELLANEOUS) ×5 IMPLANT
SENSORWIRE 0.038 NOT ANGLED (WIRE) ×5
SET CYSTO W/LG BORE CLAMP LF (SET/KITS/TRAYS/PACK) ×5 IMPLANT
SHEATH URETERAL 12FRX35CM (MISCELLANEOUS) IMPLANT
SOL .9 NS 3000ML IRR  AL (IV SOLUTION) ×2
SOL .9 NS 3000ML IRR UROMATIC (IV SOLUTION) ×3 IMPLANT
STENT URET 6FRX24 CONTOUR (STENTS) IMPLANT
STENT URET 6FRX26 CONTOUR (STENTS) IMPLANT
SURGILUBE 2OZ TUBE FLIPTOP (MISCELLANEOUS) ×5 IMPLANT
SYRINGE IRR TOOMEY STRL 70CC (SYRINGE) ×5 IMPLANT
WATER STERILE IRR 1000ML POUR (IV SOLUTION) ×5 IMPLANT
WIRE SENSOR 0.038 NOT ANGLED (WIRE) ×3 IMPLANT

## 2016-05-11 NOTE — OR Nursing (Signed)
Patient was questioning whether she needs a refill of flomax Dr Erlene Quan said pt did not need more flomax.

## 2016-05-11 NOTE — Transfer of Care (Signed)
Immediate Anesthesia Transfer of Care Note  Patient: Jennifer Richmond  Procedure(s) Performed: Procedure(s): URETEROSCOPY (Right) CYSTOSCOPY WITH BIOPSY  Patient Location: PACU  Anesthesia Type:General  Level of Consciousness: awake and patient cooperative  Airway & Oxygen Therapy: Patient Spontanous Breathing  Post-op Assessment: Report given to RN and Post -op Vital signs reviewed and stable  Post vital signs: Reviewed and stable  Last Vitals:  Vitals:   05/11/16 0859 05/11/16 1014  BP: 127/76 (P) 129/90  Pulse: 97 (P) 100  Resp: 16 (P) 14  Temp: (!) 35.9 C (P) 36.2 C    Last Pain:  Vitals:   05/11/16 0859  TempSrc: Tympanic         Complications: No apparent anesthesia complications

## 2016-05-11 NOTE — Discharge Instructions (Signed)
AMBULATORY SURGERY  DISCHARGE INSTRUCTIONS   1) The drugs that you were given will stay in your system until tomorrow so for the next 24 hours you should not:  A) Drive an automobile B) Make any legal decisions C) Drink any alcoholic beverage   2) You may resume regular meals tomorrow.  Today it is better to start with liquids and gradually work up to solid foods.  You may eat anything you prefer, but it is better to start with liquids, then soup and crackers, and gradually work up to solid foods.   3) Please notify your doctor immediately if you have any unusual bleeding, trouble breathing, redness and pain at the surgery site, drainage, fever, or pain not relieved by medication.    4) Additional Instructions:        Please contact your physician with any problems or Same Day Surgery at [phone], Monday through Friday 6 am to 4 pm, or [place] at [facility] number at [phone].Ureteroscopy, Care After Refer to this sheet in the next few weeks. These instructions provide you with information on caring for yourself after your procedure. Your health care provider may also give you more specific instructions. Your treatment has been planned according to current medical practices, but problems sometimes occur. Call your health care provider if you have any problems or questions after your procedure.  WHAT TO EXPECT AFTER THE PROCEDURE  After your procedure, it is typical to have the following:   A burning sensation when you urinate.  Blood in your urine. HOME CARE INSTRUCTIONS   Only take medicines as directed by your health care provider. Do not take any over-the-counter pain medication unless your health care provider says it is okay.  Take a warm bath or hold a warm washcloth over your groin to relieve burning.  Drink enough fluids to keep your urine clear or pale yellow.  Drink two 8-ounce glasses of water every hour for the first 2 hours after you get  home.  Continue to drink water often at home.  You can eat what you usually do.  Ask your surgeon when you can do your usual activities.  If you had a tube placed to keep urine flowing (ureteral stent), ask your health care provider when you need to return to have it removed.  Keep all follow-up appointments. SEEK MEDICAL CARE IF:   You have chills or fever.  You have burning pain for longer than 24 hours after the procedure.  You have blood in your urine for longer than 24 hours after the procedure. SEEK IMMEDIATE MEDICAL CARE IF:   You have large amounts of blood or clots in your urine.  You have very bad pain.  You have chest pain or trouble breathing.   This information is not intended to replace advice given to you by your health care provider. Make sure you discuss any questions you have with your health care provider.   Document Released: 08/15/2013 Document Reviewed: 08/15/2013 Elsevier Interactive Patient Education [employer].

## 2016-05-11 NOTE — Op Note (Signed)
Date of procedure: 05/11/16  Preoperative diagnosis:  1. Right flank pain 2. Right ureteral calculus  Postoperative diagnosis:  1. Same as above 2. Bladder mass of right lateral wall  Procedure: 1. Right ureteroscopy 2. Bladder biopsy  Surgeon: [name], MD  Anesthesia: General  Complications: None  Intraoperative findings: Negative right ureteroscopy.  Small 0.2 mm papillary bladder lesion on right lateral wall adjacent to right UO, biopsied.    EBL: minimal  Specimens: bladder mass  Drains: none  Indication: Jennifer Richmond is a 30 y.o. patient with with a 6 mm right UVJ stone who presents today for right ureteroscopy..  After reviewing the management options for treatment, she elected to proceed with the above surgical procedure(s). We have discussed the potential benefits and risks of the procedure, side effects of the proposed treatment, the likelihood of the patient achieving the goals of the procedure, and any potential problems that might occur during the procedure or recuperation. Informed consent has been obtained.  Description of procedure:  The patient was taken to the operating room and general anesthesia was induced.  The patient was placed in the dorsal lithotomy position, prepped and draped in the usual sterile fashion, and preoperative antibiotics were administered. A preoperative time-out was performed.   At this point in time, rigid 21 [place] scope was advanced per urethra into the bladder. Attention was turned to the right UO. Incidentally appreciated at the time of cystoscopy was a very small approximate 2 mm papillary lesion on the right lateral wall in close proximity but not involving the right UO. Given her age and very low-grade appearance of the lesion, I do not suspect malignancy.  A 5 French open-ended ureteral catheter was placed just within the right UO and a sensor wire was placed up to level of the kidney without difficulty. The wire was then  snapped in place. A semirigid 8 French ureteroscope was then advanced alongside of the wire up the right distal ureter all the way up to the proximal ureter. No stone was encountered. There were no ureteral lesions, edema, or any other signs of a stone. The stone had presumably passed. The scope and wire were then removed.  Finally, given the incidental bladder lesion finding, I did use cold cup biopsy forceps to biopsy/remove this small lesion. This is passed off the table as bladder mass. The base of the lesion was fulgurated using Bugbee electrocautery. Again, given the very small size and absence of risk factors, I do not suspect malignancy. Hemostasis was excellent. The scope was removed.  The patient was then repositioned supine position, reversed anesthesia, taken to the PACU in stable condition.  Plan: I'll call the patient with the biopsy results. We'll have her follow-up in 6 months with a KUB.  [name], M.D.

## 2016-05-11 NOTE — OR Nursing (Signed)
Patient unable to get piercing out of left ear, tapped and OR notified.

## 2016-05-11 NOTE — H&P (Signed)
04/09/2016 --> updated 05/11/16 without change.  Surgery previous rescheduled due to NPO status.  She was seen 3 days ago in the ED, labs/ UA unremarkable.    3:03 PM   Jennifer Richmond Oct 31, 1985 [id number]  Referring provider: Kathrine Haddock, NP [place], [place] [address]      Chief Complaint  Patient presents with  . Nephrolithiasis    Ctscan results    HPI: 30 year old female who presents today approximately 3 days postpartum after delivering a healthy baby boy.  Her pregnancy was complicated by admission for flank pain during which she passed some small calculi. She presented later in her pregnancy to our clinic at which time she was 33 and 0 weeks  with right flank pain which was ongoing. Renal ultrasound the time showed right-sided hydronephrosis and nonobstructing right-sided calculi.  She ultimately delivered at 37 weeks with an uncomplicated delivery. During her immediate postpartum phase, she underwent noncontrast CT scan which demonstrated a 6 mm obstructing right distal UVJ stone. She also some small nonobstructing calculi bilaterally. She continues to have bouts of right-sided flank pain without fevers, chills, nausea or vomiting. Her urinary frequency is also improving since delivery.  She is accompanied to the office today by her newborn and older son.  PMH:     Past Medical History:  Diagnosis Date  . Abdominal pain   . Anxiety   . Asthma   . Back pain   . Bipolar disorder ([place])   . GERD (gastroesophageal reflux disease)   . Gross hematuria   . Kidney stones   . Panic attacks   . PTSD (post-traumatic stress disorder)   . Urinary hesitancy   . UTI (urinary tract infection)     Surgical History:      Past Surgical History:  Procedure Laterality Date  . DILATION AND CURETTAGE OF UTERUS    . LAPAROSCOPIC OVARIAN CYSTECTOMY      Home Medications:        Medication List           Accurate as of 04/09/16  3:03 PM.  Always use your most recent med list.           HYDROcodone-acetaminophen 5-325 MG tablet Commonly known as:  NORCO/VICODIN Take 1-2 tablets by mouth every 6 (six) hours as needed for moderate pain.  prenatal multivitamin Tabs tablet Take 1 tablet by mouth daily at 12 noon.      Allergies:      Allergies  Allergen Reactions  . Shrimp [Shellfish Allergy]     Family History:      Family History  Problem Relation Age of Onset  . Diabetes Father   . Kidney nephrosis Father   . Seizures Mother     Social History:  reports that she has been smoking Cigarettes.  She has been smoking about 0.25 packs per day for the past 0.00 years. She has never used smokeless tobacco. She reports that she does not drink alcohol or use drugs.  ROS: UROLOGY Frequent Urination?: Yes Hard to postpone urination?: No Burning/pain with urination?: No Get up at night to urinate?: Yes Leakage of urine?: No Urine stream starts and stops?: No Trouble starting stream?: No Do you have to strain to urinate?: No Blood in urine?: Yes Urinary tract infection?: No Sexually transmitted disease?: No Injury to kidneys or bladder?: No Painful intercourse?: No Weak stream?: No Currently pregnant?: No Vaginal bleeding?: No Last menstrual period?: n  Gastrointestinal Nausea?: Yes Vomiting?: No Indigestion/heartburn?: No  Diarrhea?: No Constipation?: No  Constitutional Fever: No Night sweats?: No Weight loss?: No Fatigue?: No  Skin Skin rash/lesions?: No Itching?: No  Eyes Blurred vision?: No Double vision?: No  Ears/Nose/Throat Sore throat?: No Sinus problems?: No  Hematologic/Lymphatic Swollen glands?: No Easy bruising?: No  Cardiovascular Leg swelling?: No Chest pain?: No  Respiratory Cough?: No Shortness of breath?: No  Endocrine Excessive thirst?: No  Musculoskeletal Back pain?: No Joint pain?: No  Neurological Headaches?: No Dizziness?:  No  Psychologic Depression?: No Anxiety?: No  Physical Exam: BP 124/87   Pulse 83   Ht 5\' 5"  (1.651 m)   Wt 157 lb (71.2 kg)   LMP 06/25/2015 (Exact Date)   Breastfeeding? Yes   BMI 26.13 kg/m   Constitutional:  Alert and oriented, No acute distress. Well appearing. HEENT: [place] AT, moist mucus membranes.  Trachea midline, no masses. Cardiovascular: No clubbing, cyanosis, or edema.  RRR. Respiratory: Normal respiratory effort, no increased work of breathing. CTAB. GI: Nontender.   Skin: No rashes, bruises or suspicious lesions. Neurologic: Grossly intact, no focal deficits, moving all 4 extremities. Psychiatric: Normal mood and affect.  Laboratory Data: RecentLabs       Lab Results  Component Value Date   WBC 30.1 (H) 04/07/2016   HGB 10.1 (L) 04/07/2016   HCT 30.7 (L) 04/07/2016   MCV 81.2 04/07/2016   PLT 241 04/07/2016      RecentLabs       Lab Results  Component Value Date   CREATININE 0.82 09/04/2015      Pertinent Imaging: udy Result   CLINICAL DATA: RIGHT-sided kidney stone. 24 hours postpartum.  EXAM: CT ABDOMEN AND PELVIS WITHOUT CONTRAST  TECHNIQUE: Multidetector CT imaging of the abdomen and pelvis was performed following the standard protocol without IV contrast.  COMPARISON: CT 04/19/2013  FINDINGS: Lower chest: Lung bases are clear.  Hepatobiliary: No focal hepatic lesion. No biliary duct dilatation. Gallbladder is normal. Common bile duct is normal.  Pancreas: Pancreas is normal. No ductal dilatation. No pancreatic inflammation.  Spleen: Normal spleen  Adrenals/urinary tract: 3 small nonobstructing calculi in lower pole of the RIGHT kidney. No RIGHT hydronephrosis. Mild dilatation RIGHT renal pelvis versus extrarenal pelvis. Large calculus in the distal RIGHT ureter measuring 6 mm at the vesicoureteral junction (image 79, series 2). No significant hydroureter on the RIGHT.  Four nonobstructing LEFT  renal calculi are noted. No LEFT ureterolithiasis.  Small locule gas within the bladder likely related to catheterization.  Stomach/Bowel: Stomach, small bowel, appendix, and cecum are normal. The colon and rectosigmoid colon are normal.  Vascular/Lymphatic: Abdominal aorta is normal caliber. There is no retroperitoneal or periportal lymphadenopathy. No pelvic lymphadenopathy.  Reproductive: Enlarged uterus consistent with postpartum state. There is high-density material in the lower uterine segment.  Other: No free fluid.  Musculoskeletal: No aggressive osseous lesion.  IMPRESSION: 1. Minimally obstructing calculus in the distal RIGHT ureter at the vesicoureteral junction. Calculus is relatively large at 6 mm. 2. Bilateral nonobstructing renal calculi. 3. No significant hydronephrosis on the LEFT or RIGHT. 4. Post partum uterus with blood within the lower uterine segment.   Electronically Signed By: Suzy Bouchard M.D. On: 04/07/2016 15:02    CT scan was reviewed personally today and with the patient.  Assessment & Plan:   1. Right ureteral calculus 6 mm obstructing right UVJ stone with proximal hydroureteronephrosis, intermittent flank pain. We reviewed warning symptoms and indications for urgent intervention/assessment.  We discussed various treatment options including ESWL vs. ureteroscopy, laser lithotripsy,  and stent. We discussed the risks and benefits of both including bleeding, infection, damage to surrounding structures, efficacy with need for possible further intervention, and need for temporary ureteral stent.  She is most interested in right ureteroscopy, laser lithotripsy which will be scheduled in the near future. All of her questions were answered.  We did discuss the risk of general anesthesia in the setting of breast-feeding. This will be addressed further with the anesthesia group but I have advised her to attempt to pump and  advance and store some breastmilk as she would likely be advised to pump and dump in the immediate postoperative period.  Preop urine culture.  2. Right flank pain Prescribed Vicodin dispense #20 today as needed.  3. Nephrolithiasis Small bilateral nonobstructing stones. These are all very tiny and possible spontaneously.    She was previously on potassium citrate in the past. We will  readress at her follow-up visit.  - CULTURE, URINE COMPREHENSIVE    Schedule right ureteroscopy, laser lithotripsy, right ureteral stent placement  [name], MD  [facility] [address], [place], [place] [address] [phone]

## 2016-05-11 NOTE — Anesthesia Procedure Notes (Signed)
Procedure Name: LMA Insertion Date/Time: 05/11/2016 9:42 AM Performed by: Rosaria Ferries, Reannah Totten Pre-anesthesia Checklist: Patient identified, Emergency Drugs available, Suction available and Patient being monitored Patient Re-evaluated:Patient Re-evaluated prior to inductionOxygen Delivery Method: Circle system utilized Preoxygenation: Pre-oxygenation with 100% oxygen Intubation Type: IV induction LMA: LMA inserted LMA Size: 4.0 Number of attempts: 1 Placement Confirmation: breath sounds checked- equal and bilateral Dental Injury: Teeth and Oropharynx as per pre-operative assessment

## 2016-05-11 NOTE — Anesthesia Preprocedure Evaluation (Signed)
Anesthesia Evaluation  Patient identified by MRN, date of birth, ID band Patient awake    Reviewed: Allergy & Precautions, NPO status , Patient's Chart, lab work & pertinent test results  History of Anesthesia Complications Negative for: history of anesthetic complications  Airway Mallampati: II       Dental   Pulmonary asthma , Current Smoker,           Cardiovascular negative cardio ROS       Neuro/Psych negative neurological ROS     GI/Hepatic Neg liver ROS, GERD  Controlled,  Endo/Other  negative endocrine ROS  Renal/GU Renal disease (stones)     Musculoskeletal negative musculoskeletal ROS (+)   Abdominal   Peds  Hematology negative hematology ROS (+)   Anesthesia Other Findings   Reproductive/Obstetrics                             Anesthesia Physical Anesthesia Plan  ASA: II  Anesthesia Plan: General   Post-op Pain Management:    Induction: Intravenous  Airway Management Planned: LMA  Additional Equipment:   Intra-op Plan:   Post-operative Plan:   Informed Consent: I have reviewed the patients History and Physical, chart, labs and discussed the procedure including the risks, benefits and alternatives for the proposed anesthesia with the patient or authorized representative who has indicated his/her understanding and acceptance.     Plan Discussed with:   Anesthesia Plan Comments:         Anesthesia Quick Evaluation

## 2016-05-11 NOTE — Anesthesia Postprocedure Evaluation (Signed)
Anesthesia Post Note  Patient: Jennifer Richmond  Procedure(s) Performed: Procedure(s) (LRB): URETEROSCOPY (Right) CYSTOSCOPY WITH BIOPSY  Patient location during evaluation: PACU Anesthesia Type: General Level of consciousness: awake and alert Pain management: pain level controlled Vital Signs Assessment: post-procedure vital signs reviewed and stable Respiratory status: spontaneous breathing and respiratory function stable Cardiovascular status: stable Anesthetic complications: no    Last Vitals:  Vitals:   05/11/16 0859 05/11/16 1014  BP: 127/76 129/90  Pulse: 97 100  Resp: 16 14  Temp: (!) 35.9 C 36.2 C    Last Pain:  Vitals:   05/11/16 1014  TempSrc:   PainSc: 0-No pain                 KEPHART,WILLIAM K

## 2016-05-13 LAB — SURGICAL PATHOLOGY

## 2016-05-22 ENCOUNTER — Other Ambulatory Visit: Payer: Medicaid Other

## 2016-06-01 ENCOUNTER — Encounter
Admission: RE | Admit: 2016-06-01 | Discharge: 2016-06-01 | Disposition: A | Discharge status: Home / Self Care | Payer: Medicaid Other | Source: Ambulatory Visit | Attending: Obstetrics and Gynecology | Admitting: Obstetrics and Gynecology

## 2016-06-01 NOTE — Patient Instructions (Signed)
  Your procedure is scheduled on: 06-05-16 (FRIDAY) Report to Same Day Surgery 2nd floor medical mall To find out your arrival time please call [phone] between [place] on 06-04-16 (THURSDAY)  Remember: Instructions that are not followed completely may result in serious medical risk, up to and including death, or upon the discretion of your surgeon and anesthesiologist your surgery may need to be rescheduled.    _x___ 1. Do not eat food or drink liquids after midnight. No gum chewing or hard candies.     __x__ 2. No Alcohol for 24 hours before or after surgery.   __x__3. No Smoking for 24 prior to surgery.   ____  4. Bring all medications with you on the day of surgery if instructed.    __x__ 5. Notify your doctor if there is any change in your medical condition     (cold, fever, infections).     Do not wear jewelry, make-up, hairpins, clips or nail polish.  Do not wear lotions, powders, or perfumes. You may wear deodorant.  Do not shave 48 hours prior to surgery. Men may shave face and neck.  Do not bring valuables to the hospital.    [facility] is not responsible for any belongings or valuables.               Contacts, dentures or bridgework may not be worn into surgery.  Leave your suitcase in the car. After surgery it may be brought to your room.  For patients admitted to the hospital, discharge time is determined by your treatment team.   Patients discharged the day of surgery will not be allowed to drive home.    Please read over the following fact sheets that you were given:   [facility] Preparing for Surgery and or MRSA Information   _x___ Take these medicines the morning of surgery with A SIP OF WATER:    1. MAY TAKE KLONOPIN (CLONAZEPAM) DAY OF SURGERY IF NEEDED  2.  3.  4.  5.  6.  ____Fleets enema or Magnesium Citrate as directed.   _x___ Use CHG Soap or sage wipes as directed on instruction sheet   ____ Use inhalers on the day of surgery and bring  to hospital day of surgery  ____ Stop metformin 2 days prior to surgery    ____ Take 1/2 of usual insulin dose the night before surgery and none on the morning of  surgery.   ____ Stop aspirin or coumadin, or plavix  x__ Stop Anti-inflammatories such as Advil, Aleve, Ibuprofen, Motrin, Naproxen,          Naprosyn, Goodies powders or aspirin products. Ok to take Tylenol OR OXYCODONE   ____ Stop supplements until after surgery.    ____ Bring C-Pap to the hospital.

## 2016-06-02 ENCOUNTER — Encounter
Admission: RE | Admit: 2016-06-02 | Discharge: 2016-06-02 | Disposition: A | Discharge status: Home / Self Care | Payer: Medicaid Other | Source: Ambulatory Visit | Attending: Obstetrics and Gynecology | Admitting: Obstetrics and Gynecology

## 2016-06-02 DIAGNOSIS — Z01818 Encounter for other preprocedural examination: Secondary | ICD-10-CM | POA: Diagnosis present

## 2016-06-02 LAB — COMPREHENSIVE METABOLIC PANEL
ALBUMIN: 3.6 g/dL (ref 3.5–5.0)
ALT: 14 U/L (ref 14–54)
ANION GAP: 6 (ref 5–15)
AST: 21 U/L (ref 15–41)
Alkaline Phosphatase: 81 U/L (ref 38–126)
BUN: 17 mg/dL (ref 6–20)
CHLORIDE: 108 mmol/L (ref 101–111)
CO2: 24 mmol/L (ref 22–32)
Calcium: 9.1 mg/dL (ref 8.9–10.3)
Creatinine, Ser: 0.94 mg/dL (ref 0.44–1.00)
GFR calc non Af Amer: >60 mL/min (ref >60)
Glucose, Bld: 115 mg/dL — ABNORMAL HIGH (ref 65–99)
Potassium: 3.6 mmol/L (ref 3.5–5.1)
SODIUM: 138 mmol/L (ref 135–145)
Total Bilirubin: 0.4 mg/dL (ref 0.3–1.2)
Total Protein: 7 g/dL (ref 6.5–8.1)

## 2016-06-02 LAB — TYPE AND SCREEN
ABO/RH(D): A POS
ANTIBODY SCREEN: NEGATIVE
Extend sample reason: UNDETERMINED

## 2016-06-02 LAB — CBC
HCT: 40.6 % (ref 35.0–47.0)
Hemoglobin: 13.7 g/dL (ref 12.0–16.0)
MCH: 27.2 pg (ref 26.0–34.0)
MCHC: 33.6 g/dL (ref 32.0–36.0)
MCV: 80.9 fL (ref 80.0–100.0)
PLATELETS: 278 10*3/uL (ref 150–440)
RBC: 5.02 MIL/uL (ref 3.80–5.20)
RDW: 17.7 % — ABNORMAL HIGH (ref 11.5–14.5)
WBC: 9.9 10*3/uL (ref 3.6–11.0)

## 2016-06-04 ENCOUNTER — Encounter: Payer: Self-pay | Admitting: *Deleted

## 2016-06-05 ENCOUNTER — Ambulatory Visit: Payer: Medicaid Other | Admitting: Anesthesiology

## 2016-06-05 ENCOUNTER — Encounter
Admission: RE | Disposition: A | Discharge status: Home / Self Care | Payer: Self-pay | Source: Ambulatory Visit | Attending: Obstetrics and Gynecology

## 2016-06-05 ENCOUNTER — Encounter: Payer: Self-pay | Admitting: *Deleted

## 2016-06-05 ENCOUNTER — Ambulatory Visit
Admission: RE | Admit: 2016-06-05 | Discharge: 2016-06-05 | Disposition: A | Discharge status: Home / Self Care | Payer: Medicaid Other | Source: Ambulatory Visit | Attending: Obstetrics and Gynecology | Admitting: Obstetrics and Gynecology

## 2016-06-05 DIAGNOSIS — K219 Gastro-esophageal reflux disease without esophagitis: Secondary | ICD-10-CM | POA: Insufficient documentation

## 2016-06-05 DIAGNOSIS — F319 Bipolar disorder, unspecified: Secondary | ICD-10-CM | POA: Diagnosis not present

## 2016-06-05 DIAGNOSIS — F419 Anxiety disorder, unspecified: Secondary | ICD-10-CM | POA: Diagnosis not present

## 2016-06-05 DIAGNOSIS — Z302 Encounter for sterilization: Secondary | ICD-10-CM

## 2016-06-05 HISTORY — PX: LAPAROSCOPIC TUBAL LIGATION: SHX1937

## 2016-06-05 LAB — POCT PREGNANCY, URINE: PREG TEST UR: NEGATIVE

## 2016-06-05 LAB — URINE DRUG SCREEN, QUALITATIVE (ARMC ONLY)
Amphetamines, Ur Screen: NOT DETECTED
BARBITURATES, UR SCREEN: NOT DETECTED
BENZODIAZEPINE, UR SCRN: NOT DETECTED
CANNABINOID 50 NG, UR ~~LOC~~: POSITIVE — AB
Cocaine Metabolite,Ur ~~LOC~~: NOT DETECTED
MDMA (Ecstasy)Ur Screen: NOT DETECTED
METHADONE SCREEN, URINE: NOT DETECTED
Opiate, Ur Screen: NOT DETECTED
Phencyclidine (PCP) Ur S: NOT DETECTED
TRICYCLIC, UR SCREEN: NOT DETECTED

## 2016-06-05 LAB — PREGNANCY, URINE: Preg Test, Ur: NEGATIVE

## 2016-06-05 SURGERY — LIGATION, FALLOPIAN TUBE, LAPAROSCOPIC
Anesthesia: General | Site: Abdomen | Laterality: Bilateral | Wound class: Clean Contaminated

## 2016-06-05 MED ORDER — BUPIVACAINE-EPINEPHRINE 0.25% -1:200000 IJ SOLN
INTRAMUSCULAR | Status: DC | PRN
  Administered 2016-06-05: 6 mL

## 2016-06-05 MED ORDER — OXYCODONE-ACETAMINOPHEN 5-325 MG PO TABS: 1.0000 | ORAL_TABLET | ORAL | 0 refills | Status: DC | PRN

## 2016-06-05 MED ORDER — FENTANYL CITRATE (PF) 100 MCG/2ML IJ SOLN
25.0000 ug | INTRAMUSCULAR | Status: AC | PRN
  Administered 2016-06-05 (×6): 25 ug via INTRAVENOUS

## 2016-06-05 MED ORDER — PROPOFOL 10 MG/ML IV BOLUS
INTRAVENOUS | Status: DC | PRN
  Administered 2016-06-05: 120 mg via INTRAVENOUS

## 2016-06-05 MED ORDER — FAMOTIDINE 20 MG PO TABS
20.0000 mg | ORAL_TABLET | Freq: Once | ORAL | Status: AC
  Administered 2016-06-05: 20 mg via ORAL

## 2016-06-05 MED ORDER — LIDOCAINE HCL (CARDIAC) 20 MG/ML IV SOLN
INTRAVENOUS | Status: DC | PRN
  Administered 2016-06-05: 30 mg via INTRAVENOUS

## 2016-06-05 MED ORDER — IBUPROFEN 600 MG PO TABS: 600.0000 mg | ORAL_TABLET | Freq: Four times a day (QID) | ORAL | 0 refills | Status: DC | PRN

## 2016-06-05 MED ORDER — LACTATED RINGERS IV SOLN
INTRAVENOUS | Status: DC
  Administered 2016-06-05: 15:00:00 via INTRAVENOUS

## 2016-06-05 MED ORDER — OXYCODONE HCL 5 MG/5ML PO SOLN: 5.0000 mg | Freq: Once | ORAL | Status: AC | PRN

## 2016-06-05 MED ORDER — BUPIVACAINE-EPINEPHRINE (PF) 0.25% -1:200000 IJ SOLN
INTRAMUSCULAR | Status: AC
  Filled 2016-06-05: qty 30

## 2016-06-05 MED ORDER — FENTANYL CITRATE (PF) 100 MCG/2ML IJ SOLN
INTRAMUSCULAR | Status: AC
  Administered 2016-06-05: 25 ug via INTRAVENOUS
  Filled 2016-06-05: qty 2

## 2016-06-05 MED ORDER — OXYCODONE HCL 5 MG PO TABS
5.0000 mg | ORAL_TABLET | Freq: Once | ORAL | Status: AC | PRN
  Administered 2016-06-05: 5 mg via ORAL

## 2016-06-05 MED ORDER — DEXAMETHASONE SODIUM PHOSPHATE 10 MG/ML IJ SOLN
INTRAMUSCULAR | Status: DC | PRN
  Administered 2016-06-05: 10 mg via INTRAVENOUS

## 2016-06-05 MED ORDER — FAMOTIDINE 20 MG PO TABS
ORAL_TABLET | ORAL | Status: AC
  Administered 2016-06-05: 20 mg via ORAL
  Filled 2016-06-05: qty 1

## 2016-06-05 MED ORDER — FENTANYL CITRATE (PF) 100 MCG/2ML IJ SOLN
INTRAMUSCULAR | Status: DC | PRN
  Administered 2016-06-05: 50 ug via INTRAVENOUS
  Administered 2016-06-05: 100 ug via INTRAVENOUS
  Administered 2016-06-05: 50 ug via INTRAVENOUS

## 2016-06-05 MED ORDER — SUGAMMADEX SODIUM 200 MG/2ML IV SOLN
INTRAVENOUS | Status: DC | PRN
  Administered 2016-06-05: 130 mg via INTRAVENOUS

## 2016-06-05 MED ORDER — ROCURONIUM BROMIDE 100 MG/10ML IV SOLN
INTRAVENOUS | Status: DC | PRN
  Administered 2016-06-05: 20 mg via INTRAVENOUS
  Administered 2016-06-05: 30 mg via INTRAVENOUS

## 2016-06-05 MED ORDER — GLYCOPYRROLATE 0.2 MG/ML IJ SOLN
INTRAMUSCULAR | Status: DC | PRN
  Administered 2016-06-05: 0.2 mg via INTRAVENOUS

## 2016-06-05 MED ORDER — OXYCODONE HCL 5 MG PO TABS
ORAL_TABLET | ORAL | Status: AC
  Filled 2016-06-05: qty 1

## 2016-06-05 SURGICAL SUPPLY — 31 items
BAG URO DRAIN 2000ML W/SPOUT (MISCELLANEOUS) ×3 IMPLANT
BLADE SURG SZ11 CARB STEEL (BLADE) ×3 IMPLANT
CATH FOLEY 2WAY  5CC 16FR (CATHETERS) ×2
CATH ROBINSON RED A/P 16FR (CATHETERS) ×3 IMPLANT
CATH URTH 16FR FL 2W BLN LF (CATHETERS) ×1 IMPLANT
CHLORAPREP W/TINT 26ML (MISCELLANEOUS) ×3 IMPLANT
CLIP FILSHIE TUBAL LIGA STRL (Clip) ×3 IMPLANT
DRAPE LAPAROTOMY 100X77 ABD (DRAPES) ×3 IMPLANT
DRAPE LEGGINS SURG 28X43 STRL (DRAPES) ×3 IMPLANT
GLOVE BIO SURGEON STRL SZ7 (GLOVE) ×6 IMPLANT
GLOVE BIOGEL PI IND STRL 7.5 (GLOVE) ×1 IMPLANT
GLOVE BIOGEL PI INDICATOR 7.5 (GLOVE) ×2
GOWN STRL REUS W/ TWL LRG LVL3 (GOWN DISPOSABLE) ×2 IMPLANT
GOWN STRL REUS W/TWL LRG LVL3 (GOWN DISPOSABLE) ×4
KIT RM TURNOVER CYSTO AR (KITS) ×3 IMPLANT
LABEL OR SOLS (LABEL) ×3 IMPLANT
LIGASURE MARYLAND LAP STAND (ELECTROSURGICAL) IMPLANT
LIGASURE VESSEL 5MM BLUNT TIP (ELECTROSURGICAL) ×3 IMPLANT
LIQUID BAND (GAUZE/BANDAGES/DRESSINGS) ×3 IMPLANT
NS IRRIG 500ML POUR BTL (IV SOLUTION) ×3 IMPLANT
PACK GYN LAPAROSCOPIC (MISCELLANEOUS) ×3 IMPLANT
PAD OB MATERNITY 4.3X12.25 (PERSONAL CARE ITEMS) ×3 IMPLANT
PAD PREP 24X41 OB/GYN DISP (PERSONAL CARE ITEMS) ×3 IMPLANT
SLEEVE ENDOPATH XCEL 5M (ENDOMECHANICALS) ×3 IMPLANT
SUT MNCRL 3-0 UNDYED SH (SUTURE) ×1 IMPLANT
SUT MONOCRYL 3-0 UNDYED (SUTURE) ×2
SUT VIC AB 2-0 UR6 27 (SUTURE) ×3 IMPLANT
SYRINGE 10CC LL (SYRINGE) ×3 IMPLANT
TROCAR ENDO BLADELESS 11MM (ENDOMECHANICALS) ×3 IMPLANT
TROCAR XCEL NON-BLD 5MMX100MML (ENDOMECHANICALS) ×3 IMPLANT
TUBING INSUFFLATOR HI FLOW (MISCELLANEOUS) ×3 IMPLANT

## 2016-06-05 NOTE — Anesthesia Preprocedure Evaluation (Signed)
Anesthesia Evaluation  Patient identified by MRN, date of birth, ID band Patient awake    Reviewed: Allergy & Precautions, H&P , NPO status , Patient's Chart, lab work & pertinent test results  History of Anesthesia Complications Negative for: history of anesthetic complications  Airway Mallampati: II  TM Distance: >3 FB Neck ROM: full    Dental no notable dental hx. (+) Poor Dentition, Chipped   Pulmonary neg shortness of breath, asthma , Current Smoker,    Pulmonary exam normal breath sounds clear to auscultation       Cardiovascular Exercise Tolerance: Good (-) angina(-) Past MI and (-) DOE negative cardio ROS Normal cardiovascular exam Rhythm:regular Rate:Normal     Neuro/Psych PSYCHIATRIC DISORDERS Anxiety Bipolar Disorder negative neurological ROS     GI/Hepatic Neg liver ROS, GERD  Controlled,  Endo/Other  negative endocrine ROS  Renal/GU Renal disease     Musculoskeletal   Abdominal   Peds  Hematology negative hematology ROS (+)   Anesthesia Other Findings Past Medical History: No date: Abdominal pain No date: Anxiety No date: Asthma No date: Back pain No date: Bipolar disorder (HCC) No date: GERD (gastroesophageal reflux disease) No date: Gross hematuria No date: Kidney stones No date: Panic attacks No date: Panic attacks No date: PTSD (post-traumatic stress disorder) No date: Urinary hesitancy No date: UTI (urinary tract infection)  Past Surgical History: 05/11/2016: CYSTOSCOPY WITH BIOPSY     Comment: Procedure: CYSTOSCOPY WITH BIOPSY;  Surgeon:               [name], MD;  Location: [facility] ORS;                Service: Urology;; No date: DILATION AND CURETTAGE OF UTERUS No date: LAPAROSCOPIC OVARIAN CYSTECTOMY 05/11/2016: URETEROSCOPY Right     Comment: Procedure: URETEROSCOPY;  Surgeon: [name], MD;  Location: [facility] ORS;  Service:               Urology;   Laterality: Right;     Reproductive/Obstetrics negative OB ROS                             Anesthesia Physical Anesthesia Plan  ASA: III  Anesthesia Plan: General ETT   Post-op Pain Management:    Induction:   Airway Management Planned:   Additional Equipment:   Intra-op Plan:   Post-operative Plan:   Informed Consent: I have reviewed the patients History and Physical, chart, labs and discussed the procedure including the risks, benefits and alternatives for the proposed anesthesia with the patient or authorized representative who has indicated his/her understanding and acceptance.     Plan Discussed with: Anesthesiologist, CRNA and Surgeon  Anesthesia Plan Comments:         Anesthesia Quick Evaluation

## 2016-06-05 NOTE — Anesthesia Postprocedure Evaluation (Signed)
Anesthesia Post Note  Patient: Jennifer Richmond  Procedure(s) Performed: Procedure(s) (LRB): LAPAROSCOPIC TUBAL LIGATION with bilateral salpingectomy (Bilateral)  Patient location during evaluation: PACU Anesthesia Type: General Level of consciousness: awake and alert Pain management: pain level controlled Vital Signs Assessment: post-procedure vital signs reviewed and stable Respiratory status: spontaneous breathing, nonlabored ventilation, respiratory function stable and patient connected to nasal cannula oxygen Cardiovascular status: blood pressure returned to baseline and stable Postop Assessment: no signs of nausea or vomiting Anesthetic complications: no    Last Vitals:  Vitals:   06/05/16 1722 06/05/16 1744  BP: 103/62 107/64  Pulse: (!) 53 74  Resp: 15 16  Temp: 37 C     Last Pain:  Vitals:   06/05/16 1800  TempSrc:   PainSc: 5                  Markees Carns S

## 2016-06-05 NOTE — Discharge Instructions (Signed)
AMBULATORY SURGERY  °DISCHARGE INSTRUCTIONS ° ° °1) The drugs that you were given will stay in your system until tomorrow so for the next 24 hours you should not: ° °A) Drive an automobile °B) Make any legal decisions °C) Drink any alcoholic beverage ° ° °2) You may resume regular meals tomorrow.  Today it is better to start with liquids and gradually work up to solid foods. ° °You may eat anything you prefer, but it is better to start with liquids, then soup and crackers, and gradually work up to solid foods. ° ° °3) Please notify your doctor immediately if you have any unusual bleeding, trouble breathing, redness and pain at the surgery site, drainage, fever, or pain not relieved by medication. ° ° ° °4) Additional Instructions: ° ° ° ° ° ° ° °Please contact your physician with any problems or Same Day Surgery at [phone], Monday through Friday 6 am to 4 pm, or [place] [facility] [place] Main number at [phone]. °

## 2016-06-05 NOTE — Op Note (Signed)
  Operative Note    Pre-Op Diagnosis: Multiparity, desires permanent sterilization  Post-Op Diagnosis: Multiparity, desires permanent sterilization  Procedures: Laparoscopic bilateral tubal ligation by way of bilateral salpingectomy  Primary Surgeon: Prentice Docker, MD   EBL: 10 mL   IVF: 600 mL   Specimens: left and right fallopian tubes  Drains: None  Complications: None   Disposition: PACU   Condition: Stable   Findings: normal appearing uterus, cervix, and bilateral fallopian tubes  Procedure Summary:  The patient was taken to the operating room where general anesthesia was administered and found to be adequate. She was placed in the dorsal supine lithotomy position in [place] stirrups and prepped and draped in usual sterile fashion. After a timeout was called an indwelling catheter was placed in her bladder. A sterile sponge on a stick was placed in the vagina for uterine manipulation.   Attention was turned to the abdomen where after injection of local anesthetic, a 5 mm infraumbilical incision was made with the scalpel. Entry into the abdomen was obtained via Optiview trocar technique (a blunt entry technique with camera visualization through the obturator upon entry). Verification of entry into the abdomen was obtained using opening pressures. The abdomen was insufflated with CO2. The camera was introduced through the trocar with verification of atraumatic entry.  A left lower quadrant 4mm port and suprapubic 66mm port was placed under direct intra-abdominal camera visualization without difficulty.  The left fallopian tube was identified and grasped at the fimbriated end with the atraumatic grasper. The 33mm Ligasure device was used to transact the mesosalpinx in a lateral to medial fashion.  The fallopian tube was removed through the 11 mm port intact.  The same procedure was carried out on the right fallopian tube and the fallopian tube was removed through the 11 mm port  without difficulty.    The intraabdominal pressure was lowered to 5 mmHg and hemostasis was verified.  The abdomen was desufflated of CO2 and five deep breaths were given by anesthesia to maximize removal of CO2.  All trocars were removed.  The 71mm port site fascia was closed with a single interrupted 2-0 vicryl on a UR6 needle.  The skin of all port sites was closed using a 4-0 monocryl subcuticular stitch. The skin incisions were then covered with Dermabond.    The foley catheter was removed and the vagina sponge on a stick was removed. The vagina was swept to ensure no instrument or sponges were left behind.    The patient tolerated the procedure well.  Sponge, lap, needle, and instrument counts were correct x 2.  VTE prophylaxis: SCDs. Antibiotic prophylaxis: none given and none indicated. She was awakened in the operating room and was taken to the PACU in stable condition.   Prentice Docker, MD 06/05/2016 4:25 PM

## 2016-06-05 NOTE — H&P (Signed)
History and Physical Interval Note:  Jennifer Richmond  has presented today for surgery, with the diagnosis of desire for permanent sterility  The various methods of treatment have been discussed with the patient and family. After consideration of risks, benefits and other options for treatment, the patient has consented to  Procedure(s): LAPAROSCOPIC TUBAL LIGATION (Bilateral) as a surgical intervention .  The patient's history has been reviewed, patient examined, no change in status, stable for surgery.  I have reviewed the patient's chart and labs.  Questions were answered to the patient's satisfaction.    Will Bonnet, MD 06/05/2016 2:53 PM

## 2016-06-05 NOTE — Anesthesia Procedure Notes (Signed)
Procedure Name: Intubation Date/Time: 06/05/2016 3:08 PM Performed by: Jonna Clark Pre-anesthesia Checklist: Patient identified, Patient being monitored, Timeout performed, Emergency Drugs available and Suction available Patient Re-evaluated:Patient Re-evaluated prior to inductionOxygen Delivery Method: Circle system utilized Preoxygenation: Pre-oxygenation with 100% oxygen Intubation Type: IV induction Ventilation: Mask ventilation without difficulty Laryngoscope Size: Mac and 3 Grade View: Grade I Tube type: Oral Tube size: 7.0 mm Number of attempts: 1 Airway Equipment and Method: Stylet Placement Confirmation: ETT inserted through vocal cords under direct vision,  positive ETCO2 and breath sounds checked- equal and bilateral Secured at: 21 cm Tube secured with: Tape Dental Injury: Teeth and Oropharynx as per pre-operative assessment

## 2016-06-05 NOTE — Transfer of Care (Signed)
Immediate Anesthesia Transfer of Care Note  Patient: Jennifer Richmond  Procedure(s) Performed: Procedure(s): LAPAROSCOPIC TUBAL LIGATION with bilateral salpingectomy (Bilateral)  Patient Location: PACU  Anesthesia Type:General  Level of Consciousness: awake, alert  and oriented  Airway & Oxygen Therapy: Patient Spontanous Breathing and Patient connected to face mask oxygen  Post-op Assessment: Report given to RN and Post -op Vital signs reviewed and stable  Post vital signs: Reviewed and stable  Last Vitals:  Vitals:   06/05/16 1359 06/05/16 1639  BP: 97/62 113/63  Pulse: 64 75  Resp: 16   Temp: 36.7 C 36.3 C    Last Pain:  Vitals:   06/05/16 1359  TempSrc: Oral  PainSc: 0-No pain         Complications: No apparent anesthesia complications

## 2016-06-08 ENCOUNTER — Encounter: Payer: Self-pay | Admitting: Obstetrics and Gynecology

## 2016-06-09 LAB — SURGICAL PATHOLOGY

## 2016-11-04 NOTE — Progress Notes (Deleted)
11/05/2016 10:00 PM   Jennifer Richmond 1986-02-07 [id number]  Referring provider: Kathrine Haddock, NP [place], [place] [address]  No chief complaint on file.   HPI: Patient is a 31 year old Caucasian female with a history of nephrolithiasis who presents today requesting an urgent appointment for valuation of back pain associated with vomiting.  She was last seen in our office in the fall of 2017.  She underwent ureteroscopic B for a 6 mm UVJ stone, but the stone was not seen during the procedure and presumed past. She did have a papillary lesion found during the procedure and it was biopsied pathology report noted   BLADDER; BIOPSY:  - SUPERFICIAL SAMPLING OF A MINUTE PAPILLARY UROTHELIAL LESION, SEE  NOTE.  - MULTIPLE DEEPER LEVELS WERE EXAMINED.   Note: The differential diagnosis includes papilloma and papillary  urothelial neoplasm of low malignant potential.   Her CT Renal stone study completed in 03/2016 noted  Minimally obstructing calculus in the distal RIGHT ureter at the vesicoureteral junction. Calculus is relatively large at 6 mm.  Bilateral nonobstructing renal calculi.  No significant hydronephrosis on the LEFT or RIGHT.  Post partum uterus with blood within the lower uterine segment.  I have independently reviewed the films.  Today, ***        PMH: Past Medical History:  Diagnosis Date  . Abdominal pain   . Anxiety   . Asthma   . Back pain   . Bipolar disorder ([place])   . GERD (gastroesophageal reflux disease)   . Gross hematuria   . Kidney stones   . Panic attacks   . Panic attacks   . PTSD (post-traumatic stress disorder)   . Urinary hesitancy   . UTI (urinary tract infection)     Surgical History: Past Surgical History:  Procedure Laterality Date  . CYSTOSCOPY WITH BIOPSY  05/11/2016   Procedure: CYSTOSCOPY WITH BIOPSY;  Surgeon: [name], MD;  Location: [facility] ORS;  Service: Urology;;  . DILATION AND CURETTAGE OF UTERUS    .  LAPAROSCOPIC OVARIAN CYSTECTOMY    . LAPAROSCOPIC TUBAL LIGATION Bilateral 06/05/2016   Procedure: LAPAROSCOPIC TUBAL LIGATION with bilateral salpingectomy;  Surgeon: Will Bonnet, MD;  Location: [facility] ORS;  Service: Gynecology;  Laterality: Bilateral;  . URETEROSCOPY Right 05/11/2016   Procedure: URETEROSCOPY;  Surgeon: [name], MD;  Location: [facility] ORS;  Service: Urology;  Laterality: Right;    Home Medications:  Allergies as of 11/05/2016      Reactions   Shrimp [shellfish Allergy]       Medication List       Accurate as of 11/04/16 10:00 PM. Always use your most recent med list.          clonazePAM 1 MG tablet Commonly known as:  KLONOPIN Take 1 mg by mouth daily.   ibuprofen 600 MG tablet Commonly known as:  ADVIL,MOTRIN Take 1 tablet (600 mg total) by mouth every 6 (six) hours as needed for mild pain or cramping.   oxyCODONE-acetaminophen 5-325 MG tablet Commonly known as:  ROXICET Take 1 tablet by mouth every 4 (four) hours as needed for moderate pain or severe pain.   prenatal multivitamin Tabs tablet Take 1 tablet by mouth daily at 12 noon.       Allergies:  Allergies  Allergen Reactions  . Shrimp [Shellfish Allergy]     Family History: Family History  Problem Relation Age of Onset  . Diabetes Father   . Kidney nephrosis Father   .  Seizures Mother     Social History:  reports that she has been smoking Cigarettes.  She has been smoking about 0.25 packs per day for the past 0.00 years. She has never used smokeless tobacco. She reports that she does not drink alcohol or use drugs.  ROS:                                        Physical Exam: There were no vitals taken for this visit.  Constitutional: Well nourished. Alert and oriented, No acute distress. HEENT: [place] AT, moist mucus membranes. Trachea midline, no masses. Cardiovascular: No clubbing, cyanosis, or edema. Respiratory: Normal respiratory effort, no increased  work of breathing. GI: Abdomen is soft, non tender, non distended, no abdominal masses. Liver and spleen not palpable.  No hernias appreciated.  Stool sample for occult testing is not indicated.   GU: No CVA tenderness.  No bladder fullness or masses.   Skin: No rashes, bruises or suspicious lesions. Lymph: No cervical or inguinal adenopathy. Neurologic: Grossly intact, no focal deficits, moving all 4 extremities. Psychiatric: Normal mood and affect.  Laboratory Data: Lab Results  Component Value Date   WBC 9.9 06/02/2016   HGB 13.7 06/02/2016   HCT 40.6 06/02/2016   MCV 80.9 06/02/2016   PLT 278 06/02/2016    Lab Results  Component Value Date   CREATININE 0.94 06/02/2016    Lab Results  Component Value Date   AST 21 06/02/2016   Lab Results  Component Value Date   ALT 14 06/02/2016     Urinalysis ***  Pertinent Imaging: CLINICAL DATA:  RIGHT-sided kidney stone.  24 hours postpartum.  EXAM: CT ABDOMEN AND PELVIS WITHOUT CONTRAST  TECHNIQUE: Multidetector CT imaging of the abdomen and pelvis was performed following the standard protocol without IV contrast.  COMPARISON:  CT 04/19/2013  FINDINGS: Lower chest: Lung bases are clear.  Hepatobiliary: No focal hepatic lesion. No biliary duct dilatation. Gallbladder is normal. Common bile duct is normal.  Pancreas: Pancreas is normal. No ductal dilatation. No pancreatic inflammation.  Spleen: Normal spleen  Adrenals/urinary tract: 3 small nonobstructing calculi in lower pole of the RIGHT kidney. No RIGHT hydronephrosis. Mild dilatation RIGHT renal pelvis versus extrarenal pelvis. Large calculus in the distal RIGHT ureter measuring 6 mm at the vesicoureteral junction (image 79, series 2). No significant hydroureter on the RIGHT.  Four nonobstructing LEFT renal calculi are noted. No LEFT ureterolithiasis.  Small locule gas within the bladder likely related to catheterization.  Stomach/Bowel:  Stomach, small bowel, appendix, and cecum are normal. The colon and rectosigmoid colon are normal.  Vascular/Lymphatic: Abdominal aorta is normal caliber. There is no retroperitoneal or periportal lymphadenopathy. No pelvic lymphadenopathy.  Reproductive: Enlarged uterus consistent with postpartum state. There is high-density material in the lower uterine segment.  Other: No free fluid.  Musculoskeletal: No aggressive osseous lesion.  IMPRESSION: 1. Minimally obstructing calculus in the distal RIGHT ureter at the vesicoureteral junction. Calculus is relatively large at 6 mm. 2. Bilateral nonobstructing renal calculi. 3. No significant hydronephrosis on the LEFT or RIGHT. 4. Post partum uterus with blood within the lower uterine segment.   Electronically Signed   By: Suzy Bouchard M.D.   On: 04/07/2016 15:02   Assessment & Plan:  ***  Bilateral nephrolithiasis  - Stone composition is unknown  No Follow-up on file.  These notes generated with voice recognition software. I  apologize for typographical errors.  Zara Council, [facility] [address] [place], [place] [address] [phone]

## 2016-11-05 ENCOUNTER — Ambulatory Visit: Payer: Medicaid Other | Admitting: Urology

## 2016-11-06 ENCOUNTER — Emergency Department: Payer: Medicaid Other

## 2016-11-06 ENCOUNTER — Emergency Department
Admission: EM | Admit: 2016-11-06 | Discharge: 2016-11-06 | Disposition: A | Discharge status: Home / Self Care | Payer: Medicaid Other | Attending: Emergency Medicine | Admitting: Emergency Medicine

## 2016-11-06 ENCOUNTER — Encounter: Payer: Self-pay | Admitting: Emergency Medicine

## 2016-11-06 DIAGNOSIS — R1031 Right lower quadrant pain: Secondary | ICD-10-CM | POA: Diagnosis not present

## 2016-11-06 DIAGNOSIS — R109 Unspecified abdominal pain: Secondary | ICD-10-CM

## 2016-11-06 DIAGNOSIS — F17210 Nicotine dependence, cigarettes, uncomplicated: Secondary | ICD-10-CM | POA: Insufficient documentation

## 2016-11-06 DIAGNOSIS — R110 Nausea: Secondary | ICD-10-CM | POA: Insufficient documentation

## 2016-11-06 DIAGNOSIS — Z79899 Other long term (current) drug therapy: Secondary | ICD-10-CM | POA: Insufficient documentation

## 2016-11-06 DIAGNOSIS — J45909 Unspecified asthma, uncomplicated: Secondary | ICD-10-CM | POA: Insufficient documentation

## 2016-11-06 LAB — URINALYSIS, COMPLETE (UACMP) WITH MICROSCOPIC
Bilirubin Urine: NEGATIVE
Glucose, UA: NEGATIVE mg/dL
Ketones, ur: 5 mg/dL — AB
Nitrite: NEGATIVE
PROTEIN: 30 mg/dL — AB
Specific Gravity, Urine: 1.024 (ref 1.005–1.030)
pH: 5 (ref 5.0–8.0)

## 2016-11-06 LAB — BASIC METABOLIC PANEL
ANION GAP: 7 (ref 5–15)
BUN: 16 mg/dL (ref 6–20)
CHLORIDE: 108 mmol/L (ref 101–111)
CO2: 21 mmol/L — AB (ref 22–32)
Calcium: 9.4 mg/dL (ref 8.9–10.3)
Creatinine, Ser: 0.65 mg/dL (ref 0.44–1.00)
GFR calc non Af Amer: >60 mL/min (ref >60)
Glucose, Bld: 108 mg/dL — ABNORMAL HIGH (ref 65–99)
Potassium: 4 mmol/L (ref 3.5–5.1)
Sodium: 136 mmol/L (ref 135–145)

## 2016-11-06 LAB — CBC
HEMATOCRIT: 46.4 % (ref 35.0–47.0)
HEMOGLOBIN: 15.3 g/dL (ref 12.0–16.0)
MCH: 28 pg (ref 26.0–34.0)
MCHC: 33 g/dL (ref 32.0–36.0)
MCV: 84.6 fL (ref 80.0–100.0)
Platelets: 295 10*3/uL (ref 150–440)
RBC: 5.49 MIL/uL — AB (ref 3.80–5.20)
RDW: 15.2 % — ABNORMAL HIGH (ref 11.5–14.5)
WBC: 12.6 10*3/uL — AB (ref 3.6–11.0)

## 2016-11-06 LAB — POCT PREGNANCY, URINE: Preg Test, Ur: NEGATIVE

## 2016-11-06 MED ORDER — FENTANYL CITRATE (PF) 100 MCG/2ML IJ SOLN
50.0000 ug | INTRAMUSCULAR | Status: DC | PRN
  Administered 2016-11-06: 50 ug via INTRAVENOUS
  Filled 2016-11-06: qty 2

## 2016-11-06 MED ORDER — ONDANSETRON HCL 4 MG/2ML IJ SOLN
INTRAMUSCULAR | Status: AC
  Filled 2016-11-06: qty 2

## 2016-11-06 MED ORDER — HYDROMORPHONE HCL 2 MG PO TABS
2.0000 mg | ORAL_TABLET | Freq: Once | ORAL | Status: AC
  Administered 2016-11-06: 2 mg via ORAL

## 2016-11-06 MED ORDER — ONDANSETRON HCL 4 MG/2ML IJ SOLN
4.0000 mg | Freq: Once | INTRAMUSCULAR | Status: AC | PRN
  Administered 2016-11-06: 4 mg via INTRAVENOUS

## 2016-11-06 MED ORDER — HYDROMORPHONE HCL 2 MG PO TABS
ORAL_TABLET | ORAL | Status: AC
  Filled 2016-11-06: qty 1

## 2016-11-06 MED ORDER — KETOROLAC TROMETHAMINE 30 MG/ML IJ SOLN
30.0000 mg | Freq: Once | INTRAMUSCULAR | Status: AC
  Administered 2016-11-06: 30 mg via INTRAVENOUS
  Filled 2016-11-06: qty 1

## 2016-11-06 NOTE — ED Provider Notes (Signed)
Time Seen: Approximately 1837  I have reviewed the triage notes  Chief Complaint: Nephrolithiasis   History of Present Illness: Jennifer Richmond is a 31 y.o. female  who presents with history of renal colic. She states she had kidney stones approximately 6 months ago and had several large stones at that time. She states that she has passed some stones. He felt that this was an exacerbation with mainly right flank pain. She is currently having her menstrual period and is not sure if she's had any blood in her urine. She denies any fever. She denies any loose stool or diarrhea, melena or hematochezia. Pain is primarily in the right flank that she states she's felt it in both flanks on occasion.   Past Medical History:  Diagnosis Date  . Abdominal pain   . Anxiety   . Asthma   . Back pain   . Bipolar disorder ([place])   . GERD (gastroesophageal reflux disease)   . Gross hematuria   . Kidney stones   . Panic attacks   . Panic attacks   . PTSD (post-traumatic stress disorder)   . Urinary hesitancy   . UTI (urinary tract infection)     Patient Active Problem List   Diagnosis Date Noted  . Admission for sterilization 06/05/2016  . Labor and delivery, indication for care 04/06/2016  . Nausea and vomiting during pregnancy 03/11/2016  . Flank pain 03/05/2016  . Abdominal pain affecting pregnancy 02/25/2016  . Nephrolithiasis 02/25/2016    Past Surgical History:  Procedure Laterality Date  . CYSTOSCOPY WITH BIOPSY  05/11/2016   Procedure: CYSTOSCOPY WITH BIOPSY;  Surgeon: [name], MD;  Location: [facility]S;  Service: Urology;;  . DILATION AND CURETTAGE OF UTERUS    . LAPAROSCOPIC OVARIAN CYSTECTOMY    . LAPAROSCOPIC TUBAL LIGATION Bilateral 06/05/2016   Procedure: LAPAROSCOPIC TUBAL LIGATION with bilateral salpingectomy;  Surgeon: Will Bonnet, MD;  Location: [facility] OR[facility];  Service: Gynecology;  Laterality: Bilateral;  . URETEROSCOPY Right 05/11/2016   Procedure: URETEROSCOPY;   Surgeon: [name], MD;  Location: [facility];  Service: Urology;  Laterality: Right;    Past Surgical History:  Procedure Laterality Date  . CYSTOSCOPY WITH BIOPSY  05/11/2016   Procedure: CYSTOSCOPY WITH BIOPSY;  Surgeon: [name], MD;  Location: [facility];  Service: Urology;;  . DILATION AND CURETTAGE OF UTERUS    . LAPAROSCOPIC OVARIAN CYSTECTOMY    . LAPAROSCOPIC TUBAL LIGATION Bilateral 06/05/2016   Procedure: LAPAROSCOPIC TUBAL LIGATION with bilateral salpingectomy;  Surgeon: Will Bonnet, MD;  Location: [facility];  Service: Gynecology;  Laterality: Bilateral;  . URETEROSCOPY Right 05/11/2016   Procedure: URETEROSCOPY;  Surgeon: [name], MD;  Location: [facility];  Service: Urology;  Laterality: Right;    Current Outpatient Rx  . Order #: [id number] Class: Historical Med  . Order #: [id number] Class: Print  . Order #: [id number] Class: Print  . Order #: [id number] Class: Historical Med    Allergies:  Shrimp [shellfish allergy]  Family History: Family History  Problem Relation Age of Onset  . Diabetes Father   . Kidney nephrosis Father   . Seizures Mother     Social History: Social History  Substance Use Topics  . Smoking status: Current Every Day Smoker    Packs/day: 0.25    Years: 0.00    Types: Cigarettes  . Smokeless tobacco: Never Used  . Alcohol use No     Review of Systems:   10 point review of systems was performed and  was otherwise negative:  Constitutional: No fever Eyes: No visual disturbances ENT: No sore throat, ear pain Cardiac: No chest pain Respiratory: No shortness of breath, wheezing, or stridor Abdomen: Mild anterior abdominal pain toward her right lower quadrant. Nausea with no persistent vomiting. No loose stool or diarrhea Endocrine: No weight loss, No night sweats Extremities: No peripheral edema, cyanosis Skin: No rashes, easy bruising Neurologic: No focal weakness, trouble with speech or swollowing Urologic: No Obvious  dysuria, Hematuria, or urinary frequency   Physical Exam:  ED Triage Vitals [11/06/16 1611]  Enc Vitals Group     BP 109/74     Pulse Rate (!) 111     Resp 18     Temp 98.3 F (36.8 C)     Temp Source Oral     SpO2 100 %     Weight 130 lb (59 kg)     Height 5\' 5"  (1.651 m)     Head Circumference      Peak Flow      Pain Score 10     Pain Loc      Pain Edu?      Excl. in [place]?     General: Awake , Alert , and Oriented times 3; GCS 15 Anxious Head: Normal cephalic , atraumatic Eyes: Pupils equal , round, reactive to light Nose/Throat: No nasal drainage, patent upper airway without erythema or exudate.  Neck: Supple, Full range of motion, No anterior adenopathy or palpable thyroid masses Lungs: Clear to ascultation without wheezes , rhonchi, or rales Heart: Regular rate, regular rhythm without murmurs , gallops , or rubs Abdomen: Diffuse tenderness of the abdomen without rebound, guarding , or rigidity; bowel sounds positive and symmetric in all 4 quadrants. No organomegaly .        Extremities: 2 plus symmetric pulses. No edema, clubbing or cyanosis Neurologic: normal ambulation, Motor symmetric without deficits, sensory intact Skin: warm, dry, no rashes   Labs:   All laboratory work was reviewed including any pertinent negatives or positives listed below:  Labs Reviewed  URINALYSIS, COMPLETE (UACMP) WITH MICROSCOPIC - Abnormal; Notable for the following:       Result Value   Color, Urine YELLOW (*)    APPearance HAZY (*)    Hgb urine dipstick LARGE (*)    Ketones, ur 5 (*)    Protein, ur 30 (*)    Leukocytes, UA TRACE (*)    Bacteria, UA RARE (*)    Squamous Epithelial / LPF 6-30 (*)    All other components within normal limits  CBC - Abnormal; Notable for the following:    WBC 12.6 (*)    RBC 5.49 (*)    RDW 15.2 (*)    All other components within normal limits  BASIC METABOLIC PANEL - Abnormal; Notable for the following:    CO2 21 (*)    Glucose, Bld 108 (*)     All other components within normal limits  POCT PREGNANCY, URINE  POC URINE PREG, ED    Radiology:  "Ct Renal Stone Study  Result Date: 11/06/2016 CLINICAL DATA:  Right flank pain EXAM: CT ABDOMEN AND PELVIS WITHOUT CONTRAST TECHNIQUE: Multidetector CT imaging of the abdomen and pelvis was performed following the standard protocol without IV contrast. COMPARISON:  04/07/2016 FINDINGS: Lower chest: No acute abnormality. Hepatobiliary: No focal liver abnormality is seen. No gallstones, gallbladder wall thickening, or biliary dilatation. Pancreas: Unremarkable. No pancreatic ductal dilatation or surrounding inflammatory changes. Spleen: Normal in size without focal  abnormality. Adrenals/Urinary Tract: Adrenal glands are within normal limits. Punctate stones in the upper pole of the left kidney. 2 mm stone mid left kidney. Punctate stones in the lower pole of the right kidney no hydronephrosis. No definite ureteral stones. Bladder unremarkable. Stomach/Bowel: Stomach is within normal limits. Appendix appears normal. No evidence of bowel wall thickening, distention, or inflammatory changes. Vascular/Lymphatic: Aortic atherosclerosis. No enlarged abdominal or pelvic lymph nodes. Reproductive: Decreased size of the uterus compared to prior. No adnexal masses. Other: No free air. Nonspecific mild stranding within the pelvic. Small fat in the umbilicus Musculoskeletal: No acute or significant osseous findings. IMPRESSION: 1. No hydronephrosis or ureteral stones are visualized. There are multiple bilateral punctate stones present within both kidneys. 2. Nonspecific hazy stranding within the posterior pelvis. No colon wall thickening or evidence for adnexal mass. 3. Normal appendix Electronically Signed   By: Donavan Foil M.D.   On: 11/06/2016 18:48  "  I personally reviewed the radiologic studies    ED Course: * Patient's evaluation here did not show any evidence of an obstructive uropathy, acute  appendicitis, etc. The patient does have hematuria and was difficult to tell if this is from her menstrual. No actual hematuria. The patient denies much relief from Toradol Ventolin advised of her negative CAT scan she states at that time her pain was better. I am and felt the other possibility was that she had a recently passed kidney stone with no evidence of urinary obstruction. Unlikely to be a renal infarction.    Final Clinical Impression:  Final diagnoses:  Right flank pain  Right lower quadrant abdominal pain     Plan: * Outpatient Patient was advised to return immediately if condition worsens. Patient was advised to follow up with their primary care physician or other specialized physicians involved in their outpatient care. The patient and/or family member/power of attorney had laboratory results reviewed at the bedside. All questions and concerns were addressed and appropriate discharge instructions were distributed by the nursing staff.           Daymon Larsen, MD 11/06/16 2102

## 2016-11-06 NOTE — ED Notes (Signed)
Pt presents with flank pain ongoing for several months. She began to have increasing pain this afternoon. She has appt with urologist on Monday, but states she states the pain is too bad to handle. Denies fever, abnormal bleeding. Has recent yeast infection.

## 2016-11-06 NOTE — Discharge Instructions (Signed)
Please take over-the-counter ibuprofen and to 3 tablets every 6-8 hours for pain. And also supplement that with Tylenol. Return emergency department especially for fever, uncontrolled vomiting, bloody stool, or any other new concerns. Contact your urologist for second opinion and evaluation  Please return immediately if condition worsens. Please contact her primary physician or the physician you were given for referral. If you have any specialist physicians involved in her treatment and plan please also contact them. Thank you for using [facility] emergency [facility].

## 2016-11-06 NOTE — ED Triage Notes (Addendum)
Pt reports hx of kidney stones 6 months ago, had several large stones at that time. Pt states she has passed some and is due to see urologist Dr. Erlene Quan on Monday. Pt states pain started about a week ago, but worsened today. Right flank pain 10/10.  Recent yeast infection treatment.

## 2016-11-06 NOTE — ED Notes (Signed)

## 2016-11-06 NOTE — ED Notes (Signed)
Attempted IV acces x 1, unsuccessful, able to get blood specimen

## 2016-11-08 NOTE — Progress Notes (Deleted)
11/09/2016 7:45 PM   Jennifer Richmond 28-Jul-1986 [id number]  Referring provider: Kathrine Haddock, NP [place], [place] [address]  No chief complaint on file.   HPI: Patient is a 31 year old Caucasian female with a history of nephrolithiasis who presents today requesting an urgent appointment for evaluation of back pain associated with vomiting.  She was last seen in our office in the fall of 2017.  She underwent ureteroscopy for a 6 mm UVJ stone, but the stone was not seen during the procedure and presumed past. She did have a papillary lesion found during the procedure and it was biopsied pathology report noted   BLADDER; BIOPSY:  - SUPERFICIAL SAMPLING OF A MINUTE PAPILLARY UROTHELIAL LESION, SEE  NOTE.  - MULTIPLE DEEPER LEVELS WERE EXAMINED.   Note: The differential diagnosis includes papilloma and papillary  urothelial neoplasm of low malignant potential.   Her CT Renal stone study completed in 03/2016 noted minimally obstructing calculus in the distal RIGHT ureter at the vesicoureteral junction. Calculus is relatively large at 6 mm.  Bilateral nonobstructing renal calculi.  No significant hydronephrosis on the LEFT or RIGHT.  Post partum uterus with blood within the lower uterine segment.  I have independently reviewed the films.  She was seen at [facility] emergency room on 11/06/2016 for right flank pain.  Patient states the onset of the pain was *** hours/days/weeks ago.   It was sharp/dull***.   It lasted for ***.   The pain was located right and radiated to ***.  The pain was a ***/10.  *** made the pain better.   *** made the pain worse.  She/He did have or did not have gross hematuria, fevers, chills, nausea or vomiting.***  In the ED, she received Dilaudid, Toradol and Zofran.  She was currently on her menses.  Serum creatinine 0.65.  WBC count 12.6.    CT Renal stone study performed on 11/06/2016 noted no hydronephrosis or ureteral stones  are visualized. There are multiple bilateral punctate stones present within both kidneys.  Nonspecific hazy stranding within the posterior pelvis. No colon wall thickening or evidence for adnexal mass.  Normal appendix.  I have independently reviewed the films.  Today, ***.  UA ***.     PMH: Past Medical History:  Diagnosis Date  . Abdominal pain   . Anxiety   . Asthma   . Back pain   . Bipolar disorder ([place])   . GERD (gastroesophageal reflux disease)   . Gross hematuria   . Kidney stones   . Panic attacks   . Panic attacks   . PTSD (post-traumatic stress disorder)   . Urinary hesitancy   . UTI (urinary tract infection)     Surgical History: Past Surgical History:  Procedure Laterality Date  . CYSTOSCOPY WITH BIOPSY  05/11/2016   Procedure: CYSTOSCOPY WITH BIOPSY;  Surgeon: [name], MD;  Location: [facility] OR[facility];  Service: Urology;;  . DILATION AND CURETTAGE OF UTERUS    . LAPAROSCOPIC OVARIAN CYSTECTOMY    . LAPAROSCOPIC TUBAL LIGATION Bilateral 06/05/2016   Procedure: LAPAROSCOPIC TUBAL LIGATION with bilateral salpingectomy;  Surgeon: Will Bonnet, MD;  Location: [facility] OR[facility];  Service: Gynecology;  Laterality: Bilateral;  . URETEROSCOPY Right 05/11/2016   Procedure: URETEROSCOPY;  Surgeon: [name], MD;  Location: [facility] ORS;  Service: Urology;  Laterality: Right;    Home Medications:  Allergies as of 11/09/2016      Reactions   Shrimp [shellfish Allergy]  Medication List       Accurate as of 11/08/16  7:45 PM. Always use your most recent med list.          clonazePAM 1 MG tablet Commonly known as:  KLONOPIN Take 1 mg by mouth daily.   ibuprofen 600 MG tablet Commonly known as:  ADVIL,MOTRIN Take 1 tablet (600 mg total) by mouth every 6 (six) hours as needed for mild pain or cramping.   oxyCODONE-acetaminophen 5-325 MG tablet Commonly known as:  ROXICET Take 1 tablet by mouth every 4 (four) hours as needed for moderate pain or severe pain.     prenatal multivitamin Tabs tablet Take 1 tablet by mouth daily at 12 noon.       Allergies:  Allergies  Allergen Reactions  . Shrimp [Shellfish Allergy]     Family History: Family History  Problem Relation Age of Onset  . Diabetes Father   . Kidney nephrosis Father   . Seizures Mother     Social History:  reports that she has been smoking Cigarettes.  She has been smoking about 0.25 packs per day for the past 0.00 years. She has never used smokeless tobacco. She reports that she does not drink alcohol or use drugs.  ROS:                                        Physical Exam: LMP 11/04/2016 (Exact Date)   Constitutional: Well nourished. Alert and oriented, No acute distress. HEENT: [place] AT, moist mucus membranes. Trachea midline, no masses. Cardiovascular: No clubbing, cyanosis, or edema. Respiratory: Normal respiratory effort, no increased work of breathing. GI: Abdomen is soft, non tender, non distended, no abdominal masses. Liver and spleen not palpable.  No hernias appreciated.  Stool sample for occult testing is not indicated.   GU: No CVA tenderness.  No bladder fullness or masses.   Skin: No rashes, bruises or suspicious lesions. Lymph: No cervical or inguinal adenopathy. Neurologic: Grossly intact, no focal deficits, moving all 4 extremities. Psychiatric: Normal mood and affect.  Laboratory Data: Lab Results  Component Value Date   WBC 12.6 (H) 11/06/2016   HGB 15.3 11/06/2016   HCT 46.4 11/06/2016   MCV 84.6 11/06/2016   PLT 295 11/06/2016    Lab Results  Component Value Date   CREATININE 0.65 11/06/2016    Lab Results  Component Value Date   AST 21 06/02/2016   Lab Results  Component Value Date   ALT 14 06/02/2016     Urinalysis ***  Pertinent Imaging: CLINICAL DATA:  Right flank pain  EXAM: CT ABDOMEN AND PELVIS WITHOUT CONTRAST  TECHNIQUE: Multidetector CT imaging of the abdomen and pelvis was  performed following the standard protocol without IV contrast.  COMPARISON:  04/07/2016  FINDINGS: Lower chest: No acute abnormality.  Hepatobiliary: No focal liver abnormality is seen. No gallstones, gallbladder wall thickening, or biliary dilatation.  Pancreas: Unremarkable. No pancreatic ductal dilatation or surrounding inflammatory changes.  Spleen: Normal in size without focal abnormality.  Adrenals/Urinary Tract: Adrenal glands are within normal limits. Punctate stones in the upper pole of the left kidney. 2 mm stone mid left kidney. Punctate stones in the lower pole of the right kidney no hydronephrosis. No definite ureteral stones. Bladder unremarkable.  Stomach/Bowel: Stomach is within normal limits. Appendix appears normal. No evidence of bowel wall thickening, distention, or inflammatory changes.  Vascular/Lymphatic: Aortic atherosclerosis. No  enlarged abdominal or pelvic lymph nodes.  Reproductive: Decreased size of the uterus compared to prior. No adnexal masses.  Other: No free air. Nonspecific mild stranding within the pelvic. Small fat in the umbilicus  Musculoskeletal: No acute or significant osseous findings.  IMPRESSION: 1. No hydronephrosis or ureteral stones are visualized. There are multiple bilateral punctate stones present within both kidneys. 2. Nonspecific hazy stranding within the posterior pelvis. No colon wall thickening or evidence for adnexal mass. 3. Normal appendix   Electronically Signed   By: Donavan Foil M.D.   On: 11/06/2016 18:48  Assessment & Plan:  ***  1. Bilateral nephrolithiasis  - Stone composition is unknown  No Follow-up on file.  These notes generated with voice recognition software. I apologize for typographical errors.  Zara Council, [facility] [address] [place], [place] [address] [phone]

## 2016-11-09 ENCOUNTER — Ambulatory Visit: Payer: Medicaid Other | Admitting: Urology

## 2016-11-11 ENCOUNTER — Ambulatory Visit: Payer: Medicaid Other | Admitting: Urology

## 2016-11-11 ENCOUNTER — Encounter: Payer: Self-pay | Admitting: Urology

## 2016-11-13 ENCOUNTER — Emergency Department: Payer: Worker's Compensation

## 2016-11-13 ENCOUNTER — Encounter: Payer: Self-pay | Admitting: *Deleted

## 2016-11-13 ENCOUNTER — Emergency Department
Admission: EM | Admit: 2016-11-13 | Discharge: 2016-11-13 | Disposition: A | Discharge status: Home / Self Care | Payer: Worker's Compensation | Attending: Emergency Medicine | Admitting: Emergency Medicine

## 2016-11-13 DIAGNOSIS — F17210 Nicotine dependence, cigarettes, uncomplicated: Secondary | ICD-10-CM | POA: Diagnosis not present

## 2016-11-13 DIAGNOSIS — F319 Bipolar disorder, unspecified: Secondary | ICD-10-CM | POA: Insufficient documentation

## 2016-11-13 DIAGNOSIS — Y929 Unspecified place or not applicable: Secondary | ICD-10-CM | POA: Diagnosis not present

## 2016-11-13 DIAGNOSIS — Y990 Civilian activity done for income or pay: Secondary | ICD-10-CM | POA: Insufficient documentation

## 2016-11-13 DIAGNOSIS — F4310 Post-traumatic stress disorder, unspecified: Secondary | ICD-10-CM | POA: Insufficient documentation

## 2016-11-13 DIAGNOSIS — Y9389 Activity, other specified: Secondary | ICD-10-CM | POA: Insufficient documentation

## 2016-11-13 DIAGNOSIS — J45909 Unspecified asthma, uncomplicated: Secondary | ICD-10-CM | POA: Diagnosis not present

## 2016-11-13 DIAGNOSIS — F410 Panic disorder [episodic paroxysmal anxiety] without agoraphobia: Secondary | ICD-10-CM | POA: Insufficient documentation

## 2016-11-13 DIAGNOSIS — W010XXA Fall on same level from slipping, tripping and stumbling without subsequent striking against object, initial encounter: Secondary | ICD-10-CM | POA: Insufficient documentation

## 2016-11-13 DIAGNOSIS — S63501A Unspecified sprain of right wrist, initial encounter: Secondary | ICD-10-CM | POA: Insufficient documentation

## 2016-11-13 DIAGNOSIS — S6991XA Unspecified injury of right wrist, hand and finger(s), initial encounter: Secondary | ICD-10-CM | POA: Diagnosis present

## 2016-11-13 MED ORDER — TRAMADOL HCL 50 MG PO TABS: 50.0000 mg | ORAL_TABLET | Freq: Four times a day (QID) | ORAL | 0 refills | Status: DC | PRN

## 2016-11-13 MED ORDER — IBUPROFEN 600 MG PO TABS: 600.0000 mg | ORAL_TABLET | Freq: Three times a day (TID) | ORAL | 0 refills | Status: DC | PRN

## 2016-11-13 MED ORDER — IBUPROFEN 600 MG PO TABS
600.0000 mg | ORAL_TABLET | Freq: Once | ORAL | Status: AC
  Administered 2016-11-13: 600 mg via ORAL
  Filled 2016-11-13: qty 1

## 2016-11-13 MED ORDER — OXYCODONE-ACETAMINOPHEN 5-325 MG PO TABS
1.0000 | ORAL_TABLET | Freq: Once | ORAL | Status: AC
  Administered 2016-11-13: 1 via ORAL
  Filled 2016-11-13: qty 1

## 2016-11-13 NOTE — Discharge Instructions (Signed)
Wrist splint for 3-5 days as needed. °

## 2016-11-13 NOTE — ED Triage Notes (Signed)
Pt slipped at work, caught self w/ R hand. Pt c/o pain from R wrist to R elbow. R radial pulse palpable and strong, pt able to touch each finger to thumb, increased pain w/ middle finger. Hand is warm and skin moist.

## 2016-11-13 NOTE — ED Provider Notes (Signed)
[facility] Emergency Department Provider Note   ____________________________________________   None    (approximate)  I have reviewed the triage vital signs and the nursing notes.   HISTORY  Chief Complaint Wrist Pain and Fall    HPI Gentri Guardado Ellery is a 31 y.o. female patient complaining of right wrist and hand pain that radiates to the elbow secondary to a slip and near fall. Patient states she caught herself of her right hand to prevent a full fall. Patient state pain with movement of the third finger. Patient rates the pain as a 4/10. 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 Patient was placed in a pillow splint prior to transport to the ED.  Past Medical History:  Diagnosis Date  . Abdominal pain   . Anxiety   . Asthma   . Back pain   . Bipolar disorder ([place])   . GERD (gastroesophageal reflux disease)   . Gross hematuria   . Kidney stones   . Panic attacks   . Panic attacks   . PTSD (post-traumatic stress disorder)   . Urinary hesitancy   . UTI (urinary tract infection)     Patient Active Problem List   Diagnosis Date Noted  . Admission for sterilization 06/05/2016  . Labor and delivery, indication for care 04/06/2016  . Nausea and vomiting during pregnancy 03/11/2016  . Flank pain 03/05/2016  . Abdominal pain affecting pregnancy 02/25/2016  . Nephrolithiasis 02/25/2016    Past Surgical History:  Procedure Laterality Date  . CYSTOSCOPY WITH BIOPSY  05/11/2016   Procedure: CYSTOSCOPY WITH BIOPSY;  Surgeon: [name], MD;  Location: [facility] ORS;  Service: Urology;;  . DILATION AND CURETTAGE OF UTERUS    . LAPAROSCOPIC OVARIAN CYSTECTOMY    . LAPAROSCOPIC TUBAL LIGATION Bilateral 06/05/2016   Procedure: LAPAROSCOPIC TUBAL LIGATION with bilateral salpingectomy;  Surgeon: Will Bonnet, MD;  Location: [facility] ORS;  Service: Gynecology;  Laterality: Bilateral;  . URETEROSCOPY Right  05/11/2016   Procedure: URETEROSCOPY;  Surgeon: [name], MD;  Location: [facility] ORS;  Service: Urology;  Laterality: Right;    Prior to Admission medications   Medication Sig Start Date End Date Taking? Authorizing Provider  clonazePAM (KLONOPIN) 1 MG tablet Take 1 mg by mouth daily.    Historical Provider, MD  ibuprofen (ADVIL,MOTRIN) 600 MG tablet Take 1 tablet (600 mg total) by mouth every 6 (six) hours as needed for mild pain or cramping. 06/05/16   Will Bonnet, MD  ibuprofen (ADVIL,MOTRIN) 600 MG tablet Take 1 tablet (600 mg total) by mouth every 8 (eight) hours as needed. 11/13/16   Sable Feil, PA-C  oxyCODONE-acetaminophen (ROXICET) 5-325 MG tablet Take 1 tablet by mouth every 4 (four) hours as needed for moderate pain or severe pain. 06/05/16   Will Bonnet, MD  Prenatal Vit-Fe Fumarate-FA (PRENATAL MULTIVITAMIN) TABS tablet Take 1 tablet by mouth daily at 12 noon.    Historical Provider, MD  traMADol (ULTRAM) 50 MG tablet Take 1 tablet (50 mg total) by mouth every 6 (six) hours as needed for moderate pain. 11/13/16   Sable Feil, PA-C    Allergies Shrimp [shellfish allergy]  Family History  Problem Relation Age of Onset  . Diabetes Father   . Kidney nephrosis Father   . Seizures Mother     Social History Social History  Substance Use Topics  . Smoking status: Current Every Day Smoker    Packs/day:  0.25    Years: 0.00    Types: Cigarettes  . Smokeless tobacco: Never Used  . Alcohol use No    Review of Systems Constitutional: No fever/chills Eyes: No visual changes. ENT: No sore throat. Cardiovascular: Denies chest pain. Respiratory: Denies shortness of breath. Gastrointestinal: No abdominal pain.  No nausea, no vomiting.  No diarrhea.  No constipation. Genitourinary: Negative for dysuria. Musculoskeletal: Negative for back pain. Skin: Negative for rash. Neurological: Negative for headaches, focal weakness or numbness. Psychiatric:Panic attack,  PTSD, and bipolar. Allergic/Immunilogical: Shellfish ___________________________________________   PHYSICAL EXAM:  VITAL SIGNS: ED Triage Vitals  Enc Vitals Group     BP 11/13/16 2026 (!) 100/52     Pulse Rate 11/13/16 2026 68     Resp 11/13/16 2026 20     Temp 11/13/16 2026 98.7 F (37.1 C)     Temp Source 11/13/16 2026 Oral     SpO2 11/13/16 2026 99 %     Weight 11/13/16 2028 130 lb (59 kg)     Height 11/13/16 2028 5\' 5"  (1.651 m)     Head Circumference --      Peak Flow --      Pain Score 11/13/16 2039 3     Pain Loc --      Pain Edu? --      Excl. in [place]? --    Constitutional: Alert and oriented. Well appearing and in no acute distress. Eyes: Conjunctivae are normal. PERRL. EOMI. Head: Atraumatic. Nose: No congestion/rhinnorhea. Mouth/Throat: Mucous membranes are moist.  Oropharynx non-erythematous. Neck: No stridor. No cervical spine tenderness to palpation. Hematological/Lymphatic/Immunilogical: No cervical lymphadenopathy. Cardiovascular: Normal rate, regular rhythm. Grossly normal heart sounds.  Good peripheral circulation. Respiratory: Normal respiratory effort.  No retractions. Lungs CTAB. Gastrointestinal: Soft and nontender. No distention. No abdominal bruits. No CVA tenderness. Musculoskeletal:No obvious deformity, edema, erythema. Decreased range of motion with flexion extension of the wrist. Moderate guarding palpation third digit right hand. Neurologic:  Normal speech and language. No gross focal neurologic deficits are appreciated. No gait instability. Skin:  Skin is warm, dry and intact. No rash noted. No abrasion or ecchymosis. Psychiatric: Mood and affect are normal. Speech and behavior are normal.  ____________________________________________   LABS (all labs ordered are listed, but only abnormal results are displayed)  Labs Reviewed - No data to  display ____________________________________________  EKG   ____________________________________________  RADIOLOGY  No acute findings on x-ray of the right wrist. ____________________________________________   PROCEDURES  Procedure(s) performed: None  Procedures  Critical Care performed: No  ____________________________________________   INITIAL IMPRESSION / ASSESSMENT AND PLAN / ED COURSE  Pertinent labs & imaging results that were available during my care of the patient were reviewed by me and considered in my medical decision making (see chart for details).  Sprain right wrist. Discussed negative x-ray plan with patient. Patient given discharge Instructions. Patient placed in a Velcro wrist splint and given a prescription for tramadol and ibuprofen. Patient advised to follow-up family doctor condition persists.      ____________________________________________   FINAL CLINICAL IMPRESSION(S) / ED DIAGNOSES  Final diagnoses:  Sprain of right wrist, initial encounter      NEW MEDICATIONS STARTED DURING THIS VISIT:  New Prescriptions   IBUPROFEN (ADVIL,MOTRIN) 600 MG TABLET    Take 1 tablet (600 mg total) by mouth every 8 (eight) hours as needed.   TRAMADOL (ULTRAM) 50 MG TABLET    Take 1 tablet (50 mg total) by mouth every 6 (six) hours as  needed for moderate pain.     Note:  This document was prepared using Dragon voice recognition software and may include unintentional dictation errors.    LENNON RICHINS, PA-C 11/13/16 2112    Darel Hong, MD 11/13/16 2141

## 2016-11-13 NOTE — ED Notes (Signed)
Pt reports she slipped and fell at work but does not want to file workers comp claim.

## 2016-11-13 NOTE — ED Notes (Signed)
Patient c/o right wrist pain that radiates up arm after fall.

## 2016-11-16 NOTE — Progress Notes (Signed)
11/17/2016 2:22 PM   Jennifer Richmond 1986-08-05 [id number]  Referring provider: Kathrine Haddock, NP [place], [place] [phone]  Chief Complaint  Patient presents with  . Back Pain    last seen september 2017    HPI: Patient is a 31 year old Caucasian female with a history of nephrolithiasis who presents today requesting an urgent appointment for evaluation of back pain associated with vomiting.  She was last seen in our office in the fall of 2017.  She underwent ureteroscopy for a 6 mm UVJ stone, but the stone was not seen during the procedure and presumed past. She did have a papillary lesion found during the procedure and it was biopsied pathology report noted   BLADDER; BIOPSY:  - SUPERFICIAL SAMPLING OF A MINUTE PAPILLARY UROTHELIAL LESION, SEE  NOTE.  - MULTIPLE DEEPER LEVELS WERE EXAMINED.   Note: The differential diagnosis includes papilloma and papillary  urothelial neoplasm of low malignant potential.   Her CT Renal stone study completed in 03/2016 noted minimally obstructing calculus in the distal RIGHT ureter at the vesicoureteral junction. Calculus is relatively large at 6 mm.  Bilateral nonobstructing renal calculi.  No significant hydronephrosis on the LEFT or RIGHT.  Post partum uterus with blood within the lower uterine segment.  I have independently reviewed the films.  She was seen at [facility] emergency room on 11/06/2016 for right flank pain.  Patient states the onset of the pain was two weeks ago.   It was sharp.  It lasted for several hours.   The pain was located in the right flank and radiated to right lower quadrant.  The pain was a 10/10.  Bending made the pain better.   Moving made the pain worse.  She did have gross hematuria, fevers, chills, nausea or vomiting.  In the ED, she received Dilaudid, Toradol and Zofran.  She was currently on her menses.  Serum creatinine 0.65.  WBC count 12.6.    CT Renal stone study performed  on 11/06/2016 noted no hydronephrosis or ureteral stones are visualized. There are multiple bilateral punctate stones present within both kidneys.  Nonspecific hazy stranding within the posterior pelvis. No colon wall thickening or evidence for adnexal mass.  Normal appendix.  I have independently reviewed the films.  Today, she is still having lower right back pain.  She is having diarrhea, nausea and vomiting  2/10 pain.  Her UA today is unremarkable.    PMH: Past Medical History:  Diagnosis Date  . Abdominal pain   . Anxiety   . Asthma   . Back pain   . Bipolar disorder ([place])   . GERD (gastroesophageal reflux disease)   . Gross hematuria   . Kidney stones   . Panic attacks   . Panic attacks   . PTSD (post-traumatic stress disorder)   . Urinary hesitancy   . UTI (urinary tract infection)     Surgical History: Past Surgical History:  Procedure Laterality Date  . CYSTOSCOPY WITH BIOPSY  05/11/2016   Procedure: CYSTOSCOPY WITH BIOPSY;  Surgeon: [name], MD;  Location: [facility] ORS;  Service: Urology;;  . DILATION AND CURETTAGE OF UTERUS    . LAPAROSCOPIC OVARIAN CYSTECTOMY    . LAPAROSCOPIC TUBAL LIGATION Bilateral 06/05/2016   Procedure: LAPAROSCOPIC TUBAL LIGATION with bilateral salpingectomy;  Surgeon: Will Bonnet, MD;  Location: [facility] ORS;  Service: Gynecology;  Laterality: Bilateral;  . URETEROSCOPY Right 05/11/2016   Procedure: URETEROSCOPY;  Surgeon: [name], MD;  Location: [facility] ORS;  Service: Urology;  Laterality: Right;    Home Medications:  Allergies as of 11/17/2016      Reactions   Shrimp [shellfish Allergy]       Medication List       Accurate as of 11/17/16  2:22 PM. Always use your most recent med list.          clonazePAM 1 MG tablet Commonly known as:  KLONOPIN Take 1 mg by mouth daily.   ibuprofen 600 MG tablet Commonly known as:  ADVIL,MOTRIN Take 1 tablet (600 mg total) by mouth every 6 (six) hours as needed for mild pain or  cramping.   ibuprofen 600 MG tablet Commonly known as:  ADVIL,MOTRIN Take 1 tablet (600 mg total) by mouth every 8 (eight) hours as needed.   oxyCODONE-acetaminophen 5-325 MG tablet Commonly known as:  ROXICET Take 1 tablet by mouth every 4 (four) hours as needed for moderate pain or severe pain.   prenatal multivitamin Tabs tablet Take 1 tablet by mouth daily at 12 noon.   traMADol 50 MG tablet Commonly known as:  ULTRAM Take 1 tablet (50 mg total) by mouth every 6 (six) hours as needed for moderate pain.       Allergies:  Allergies  Allergen Reactions  . Shrimp [Shellfish Allergy]     Family History: Family History  Problem Relation Age of Onset  . Diabetes Father   . Kidney nephrosis Father   . Seizures Mother   . Kidney cancer Neg Hx   . Prostate cancer Neg Hx   . Bladder Cancer Neg Hx     Social History:  reports that she has been smoking Cigarettes.  She has been smoking about 0.25 packs per day for the past 0.00 years. She has never used smokeless tobacco. She reports that she uses drugs, including Marijuana. She reports that she does not drink alcohol.  ROS: UROLOGY Frequent Urination?: Yes Hard to postpone urination?: No Burning/pain with urination?: No Get up at night to urinate?: No Leakage of urine?: No Urine stream starts and stops?: No Trouble starting stream?: No Do you have to strain to urinate?: No Blood in urine?: No Urinary tract infection?: No Sexually transmitted disease?: No Injury to kidneys or bladder?: No Painful intercourse?: No Weak stream?: No Currently pregnant?: No Vaginal bleeding?: No Last menstrual period?: n  Gastrointestinal Nausea?: Yes Vomiting?: Yes Indigestion/heartburn?: No Diarrhea?: Yes Constipation?: No  Constitutional Fever: No Night sweats?: No Weight loss?: No Fatigue?: No  Skin Skin rash/lesions?: No Itching?: No  Eyes Blurred vision?: No Double vision?: No  Ears/Nose/Throat Sore throat?:  No Sinus problems?: No  Hematologic/Lymphatic Swollen glands?: No Easy bruising?: No  Cardiovascular Leg swelling?: No Chest pain?: No  Respiratory Cough?: No Shortness of breath?: No  Endocrine Excessive thirst?: No  Musculoskeletal Back pain?: No Joint pain?: No  Neurological Headaches?: No Dizziness?: No  Psychologic Depression?: Yes Anxiety?: Yes  Physical Exam: BP 101/68 (BP Location: Right Arm, Cuff Size: Normal)   Pulse 91   Ht 5\' 5"  (1.651 m)   Wt 135 lb 9.6 oz (61.5 kg)   LMP 11/04/2016 (Exact Date)   BMI 22.57 kg/m   Constitutional: Well nourished. Alert and oriented, No acute distress. HEENT: [place] AT, moist mucus membranes. Trachea midline, no masses. Cardiovascular: No clubbing, cyanosis, or edema. Respiratory: Normal respiratory effort, no increased work of breathing. GI: Abdomen is soft, non tender, non distended, no abdominal masses. Liver and spleen not palpable.  No hernias  appreciated.  Stool sample for occult testing is not indicated.   GU: No CVA tenderness.  No bladder fullness or masses.   Skin: No rashes, bruises or suspicious lesions. Lymph: No cervical or inguinal adenopathy. Neurologic: Grossly intact, no focal deficits, moving all 4 extremities. Psychiatric: Normal mood and affect.  Laboratory Data: Lab Results  Component Value Date   WBC 12.6 (H) 11/06/2016   HGB 15.3 11/06/2016   HCT 46.4 11/06/2016   MCV 84.6 11/06/2016   PLT 295 11/06/2016    Lab Results  Component Value Date   CREATININE 0.65 11/06/2016    Lab Results  Component Value Date   AST 21 06/02/2016   Lab Results  Component Value Date   ALT 14 06/02/2016     Urinalysis Unremarkable.  See EPIC.    Pertinent Imaging: CLINICAL DATA:  Right flank pain  EXAM: CT ABDOMEN AND PELVIS WITHOUT CONTRAST  TECHNIQUE: Multidetector CT imaging of the abdomen and pelvis was performed following the standard protocol without IV contrast.  COMPARISON:   04/07/2016  FINDINGS: Lower chest: No acute abnormality.  Hepatobiliary: No focal liver abnormality is seen. No gallstones, gallbladder wall thickening, or biliary dilatation.  Pancreas: Unremarkable. No pancreatic ductal dilatation or surrounding inflammatory changes.  Spleen: Normal in size without focal abnormality.  Adrenals/Urinary Tract: Adrenal glands are within normal limits. Punctate stones in the upper pole of the left kidney. 2 mm stone mid left kidney. Punctate stones in the lower pole of the right kidney no hydronephrosis. No definite ureteral stones. Bladder unremarkable.  Stomach/Bowel: Stomach is within normal limits. Appendix appears normal. No evidence of bowel wall thickening, distention, or inflammatory changes.  Vascular/Lymphatic: Aortic atherosclerosis. No enlarged abdominal or pelvic lymph nodes.  Reproductive: Decreased size of the uterus compared to prior. No adnexal masses.  Other: No free air. Nonspecific mild stranding within the pelvic. Small fat in the umbilicus  Musculoskeletal: No acute or significant osseous findings.  IMPRESSION: 1. No hydronephrosis or ureteral stones are visualized. There are multiple bilateral punctate stones present within both kidneys. 2. Nonspecific hazy stranding within the posterior pelvis. No colon wall thickening or evidence for adnexal mass. 3. Normal appendix   Electronically Signed   By: Donavan Foil M.D.   On: 11/06/2016 18:48  Assessment & Plan:    1. Bilateral nephrolithiasis  - Stone composition is   - obtain a 24 hour metabolic work up  2. Right flank pain  - ? Passage of stone  Return for pending [employer] results.  These notes generated with voice recognition software. I apologize for typographical errors.  Zara Council, [facility] [address] [place], [place] [address] [phone]

## 2016-11-17 ENCOUNTER — Ambulatory Visit (INDEPENDENT_AMBULATORY_CARE_PROVIDER_SITE_OTHER): Payer: Medicaid Other | Admitting: Urology

## 2016-11-17 ENCOUNTER — Encounter: Payer: Self-pay | Admitting: Urology

## 2016-11-17 VITALS — BP 101/68 | HR 91 | Ht 65.0 in | Wt 135.6 lb

## 2016-11-17 DIAGNOSIS — N200 Calculus of kidney: Secondary | ICD-10-CM

## 2016-11-17 DIAGNOSIS — R109 Unspecified abdominal pain: Secondary | ICD-10-CM | POA: Diagnosis not present

## 2016-11-17 LAB — URINALYSIS, COMPLETE
BILIRUBIN UA: NEGATIVE
Glucose, UA: NEGATIVE
KETONES UA: NEGATIVE
Leukocytes, UA: NEGATIVE
NITRITE UA: NEGATIVE
RBC UA: NEGATIVE
Urobilinogen, Ur: 0.2 mg/dL (ref 0.2–1.0)
pH, UA: 5 (ref 5.0–7.5)

## 2016-11-17 LAB — MICROSCOPIC EXAMINATION: RBC MICROSCOPIC, UA: NONE SEEN /HPF (ref 0–?)

## 2016-12-19 ENCOUNTER — Encounter: Payer: Self-pay | Admitting: Emergency Medicine

## 2016-12-19 ENCOUNTER — Emergency Department
Admission: EM | Admit: 2016-12-19 | Discharge: 2016-12-19 | Disposition: A | Discharge status: Home / Self Care | Payer: Medicaid Other | Attending: Emergency Medicine | Admitting: Emergency Medicine

## 2016-12-19 DIAGNOSIS — J45909 Unspecified asthma, uncomplicated: Secondary | ICD-10-CM | POA: Diagnosis not present

## 2016-12-19 DIAGNOSIS — R112 Nausea with vomiting, unspecified: Secondary | ICD-10-CM

## 2016-12-19 DIAGNOSIS — Z79899 Other long term (current) drug therapy: Secondary | ICD-10-CM | POA: Diagnosis not present

## 2016-12-19 DIAGNOSIS — F17210 Nicotine dependence, cigarettes, uncomplicated: Secondary | ICD-10-CM | POA: Insufficient documentation

## 2016-12-19 DIAGNOSIS — Z791 Long term (current) use of non-steroidal anti-inflammatories (NSAID): Secondary | ICD-10-CM | POA: Diagnosis not present

## 2016-12-19 DIAGNOSIS — N3000 Acute cystitis without hematuria: Secondary | ICD-10-CM

## 2016-12-19 DIAGNOSIS — R197 Diarrhea, unspecified: Secondary | ICD-10-CM

## 2016-12-19 LAB — CBC
HEMATOCRIT: 45.1 % (ref 35.0–47.0)
Hemoglobin: 15 g/dL (ref 12.0–16.0)
MCH: 28.8 pg (ref 26.0–34.0)
MCHC: 33.2 g/dL (ref 32.0–36.0)
MCV: 86.6 fL (ref 80.0–100.0)
Platelets: 239 10*3/uL (ref 150–440)
RBC: 5.2 MIL/uL (ref 3.80–5.20)
RDW: 15 % — ABNORMAL HIGH (ref 11.5–14.5)
WBC: 9.4 10*3/uL (ref 3.6–11.0)

## 2016-12-19 LAB — URINALYSIS, COMPLETE (UACMP) WITH MICROSCOPIC
BILIRUBIN URINE: NEGATIVE
Glucose, UA: NEGATIVE mg/dL
HGB URINE DIPSTICK: NEGATIVE
KETONES UR: 5 mg/dL — AB
LEUKOCYTES UA: NEGATIVE
Nitrite: NEGATIVE
PH: 5 (ref 5.0–8.0)
Protein, ur: 100 mg/dL — AB
Specific Gravity, Urine: 1.031 — ABNORMAL HIGH (ref 1.005–1.030)

## 2016-12-19 LAB — COMPREHENSIVE METABOLIC PANEL
ALT: 23 U/L (ref 14–54)
ANION GAP: 8 (ref 5–15)
AST: 23 U/L (ref 15–41)
Albumin: 4.1 g/dL (ref 3.5–5.0)
Alkaline Phosphatase: 64 U/L (ref 38–126)
BUN: 14 mg/dL (ref 6–20)
CHLORIDE: 106 mmol/L (ref 101–111)
CO2: 26 mmol/L (ref 22–32)
Calcium: 9.4 mg/dL (ref 8.9–10.3)
Creatinine, Ser: 0.89 mg/dL (ref 0.44–1.00)
Glucose, Bld: 112 mg/dL — ABNORMAL HIGH (ref 65–99)
POTASSIUM: 3.7 mmol/L (ref 3.5–5.1)
Sodium: 140 mmol/L (ref 135–145)
Total Bilirubin: 0.6 mg/dL (ref 0.3–1.2)
Total Protein: 7 g/dL (ref 6.5–8.1)

## 2016-12-19 LAB — CHLAMYDIA/NGC RT PCR (ARMC ONLY)
CHLAMYDIA TR: NOT DETECTED
N GONORRHOEAE: NOT DETECTED

## 2016-12-19 LAB — LIPASE, BLOOD: LIPASE: 16 U/L (ref 11–51)

## 2016-12-19 LAB — WET PREP, GENITAL
Clue Cells Wet Prep HPF POC: NONE SEEN
Sperm: NONE SEEN
Trich, Wet Prep: NONE SEEN
Yeast Wet Prep HPF POC: NONE SEEN

## 2016-12-19 LAB — POCT PREGNANCY, URINE: PREG TEST UR: NEGATIVE

## 2016-12-19 MED ORDER — METOCLOPRAMIDE HCL 5 MG/ML IJ SOLN
10.0000 mg | Freq: Once | INTRAMUSCULAR | Status: AC
  Administered 2016-12-19: 10 mg via INTRAVENOUS
  Filled 2016-12-19: qty 2

## 2016-12-19 MED ORDER — SODIUM CHLORIDE 0.9 % IV BOLUS (SEPSIS)
1000.0000 mL | Freq: Once | INTRAVENOUS | Status: AC
  Administered 2016-12-19: 1000 mL via INTRAVENOUS

## 2016-12-19 MED ORDER — METOCLOPRAMIDE HCL 10 MG PO TABS: 10.0000 mg | ORAL_TABLET | Freq: Three times a day (TID) | ORAL | 0 refills | Status: DC | PRN

## 2016-12-19 MED ORDER — NITROFURANTOIN MONOHYD MACRO 100 MG PO CAPS
100.0000 mg | ORAL_CAPSULE | Freq: Once | ORAL | Status: AC
  Administered 2016-12-19: 100 mg via ORAL
  Filled 2016-12-19: qty 1

## 2016-12-19 MED ORDER — NITROFURANTOIN MONOHYD MACRO 100 MG PO CAPS: 100.0000 mg | ORAL_CAPSULE | Freq: Two times a day (BID) | ORAL | 0 refills | Status: AC

## 2016-12-19 NOTE — ED Notes (Signed)

## 2016-12-19 NOTE — ED Provider Notes (Signed)
[facility] Emergency Department Provider Note  ____________________________________________  Time seen: Approximately 8:26 PM  I have reviewed the triage vital signs and the nursing notes.   HISTORY  Chief Complaint Emesis   HPI Jennifer Richmond is a 31 y.o. female with a history of GERD, anxiety, asthma, bipolar disorder who presents for evaluation of nausea, vomiting, and diarrhea. Patient has been having symptoms for a week. No melena, no hematemesis, no coffee-ground emesis, no hematochezia. No fever or chills. No abdominal pain. LMP 2 weeks ago. No chest pain or shortness of breath, no URI symptoms. Patient is complaining of mild dysuria over the course of the last few days and a week of moderate vaginal discharge. She went to urgent care 2 days ago and was told she had bacterial vaginosis. She was never called with the results of the remaining vaginal swabs. She reports that her discharge has not improved.  Past Medical History:  Diagnosis Date  . Abdominal pain   . Anxiety   . Asthma   . Back pain   . Bipolar disorder ([place])   . GERD (gastroesophageal reflux disease)   . Gross hematuria   . Kidney stones   . Panic attacks   . Panic attacks   . PTSD (post-traumatic stress disorder)   . Urinary hesitancy   . UTI (urinary tract infection)     Patient Active Problem List   Diagnosis Date Noted  . Admission for sterilization 06/05/2016  . Labor and delivery, indication for care 04/06/2016  . Nausea and vomiting during pregnancy 03/11/2016  . Flank pain 03/05/2016  . Abdominal pain affecting pregnancy 02/25/2016  . Nephrolithiasis 02/25/2016    Past Surgical History:  Procedure Laterality Date  . CYSTOSCOPY WITH BIOPSY  05/11/2016   Procedure: CYSTOSCOPY WITH BIOPSY;  Surgeon: [name], MD;  Location: [facility] ORS;  Service: Urology;;  . DILATION AND CURETTAGE OF UTERUS    . LAPAROSCOPIC OVARIAN CYSTECTOMY    . LAPAROSCOPIC TUBAL LIGATION  Bilateral 06/05/2016   Procedure: LAPAROSCOPIC TUBAL LIGATION with bilateral salpingectomy;  Surgeon: Will Bonnet, MD;  Location: [facility] ORS;  Service: Gynecology;  Laterality: Bilateral;  . URETEROSCOPY Right 05/11/2016   Procedure: URETEROSCOPY;  Surgeon: [name], MD;  Location: [facility] ORS;  Service: Urology;  Laterality: Right;    Prior to Admission medications   Medication Sig Start Date End Date Taking? Authorizing Provider  clonazePAM (KLONOPIN) 1 MG tablet Take 1 mg by mouth daily.    Historical Provider, MD  ibuprofen (ADVIL,MOTRIN) 600 MG tablet Take 1 tablet (600 mg total) by mouth every 6 (six) hours as needed for mild pain or cramping. 06/05/16   Will Bonnet, MD  ibuprofen (ADVIL,MOTRIN) 600 MG tablet Take 1 tablet (600 mg total) by mouth every 8 (eight) hours as needed. Patient not taking: Reported on 11/17/2016 11/13/16   Sable Feil, PA-C  metoCLOPramide (REGLAN) 10 MG tablet Take 1 tablet (10 mg total) by mouth every 8 (eight) hours as needed for nausea. 12/19/16 12/22/16  Rudene Re, MD  nitrofurantoin, macrocrystal-monohydrate, (MACROBID) 100 MG capsule Take 1 capsule (100 mg total) by mouth 2 (two) times daily. 12/19/16 12/24/16  Rudene Re, MD  oxyCODONE-acetaminophen (ROXICET) 5-325 MG tablet Take 1 tablet by mouth every 4 (four) hours as needed for moderate pain or severe pain. Patient not taking: Reported on 11/17/2016 06/05/16   Will Bonnet, MD  Prenatal Vit-Fe Fumarate-FA (PRENATAL MULTIVITAMIN) TABS tablet Take 1 tablet by mouth daily at  12 noon.    Historical Provider, MD  traMADol (ULTRAM) 50 MG tablet Take 1 tablet (50 mg total) by mouth every 6 (six) hours as needed for moderate pain. 11/13/16   Sable Feil, PA-C    Allergies Shrimp [shellfish allergy]  Family History  Problem Relation Age of Onset  . Diabetes Father   . Kidney nephrosis Father   . Seizures Mother   . Kidney cancer Neg Hx   . Prostate cancer Neg Hx   . Bladder  Cancer Neg Hx     Social History Social History  Substance Use Topics  . Smoking status: Current Every Day Smoker    Packs/day: 0.25    Years: 0.00    Types: Cigarettes  . Smokeless tobacco: Never Used  . Alcohol use No    Review of Systems  Constitutional: Negative for fever. Eyes: Negative for visual changes. ENT: Negative for sore throat. Neck: No neck pain  Cardiovascular: Negative for chest pain. Respiratory: Negative for shortness of breath. Gastrointestinal: Negative for abdominal pain. + nausea, vomiting and diarrhea. Genitourinary: + dysuria and vaginal discharge Musculoskeletal: Negative for back pain. Skin: Negative for rash. Neurological: Negative for headaches, weakness or numbness. Psych: No SI or HI  ____________________________________________   PHYSICAL EXAM:  VITAL SIGNS: ED Triage Vitals  Enc Vitals Group     BP 12/19/16 1808 108/87     Pulse Rate 12/19/16 1808 74     Resp 12/19/16 1808 18     Temp 12/19/16 1808 98.1 F (36.7 C)     Temp Source 12/19/16 1808 Oral     SpO2 12/19/16 1808 99 %     Weight 12/19/16 1809 130 lb (59 kg)     Height 12/19/16 1809 5\' 5"  (1.651 m)     Head Circumference --      Peak Flow --      Pain Score --      Pain Loc --      Pain Edu? --      Excl. in [place]? --     Constitutional: Alert and oriented. Well appearing and in no apparent distress. HEENT:      Head: Normocephalic and atraumatic.         Eyes: Conjunctivae are normal. Sclera is non-icteric. EOMI. PERRL      Mouth/Throat: Mucous membranes are moist.       Neck: Supple with no signs of meningismus. Cardiovascular: Regular rate and rhythm. No murmurs, gallops, or rubs. 2+ symmetrical distal pulses are present in all extremities. No JVD. Respiratory: Normal respiratory effort. Lungs are clear to auscultation bilaterally. No wheezes, crackles, or rhonchi.  Gastrointestinal: Soft, non tender, and non distended with positive bowel sounds. No rebound or  guarding. Genitourinary: No CVA tenderness.  Pelvic exam: Normal external genitalia, no rashes or lesions. Normal cervical mucus. Os closed. No cervical motion tenderness.  No uterine or adnexal tenderness.  Musculoskeletal: Nontender with normal range of motion in all extremities. No edema, cyanosis, or erythema of extremities. Neurologic: Normal speech and language. Face is symmetric. Moving all extremities. No gross focal neurologic deficits are appreciated. Skin: Skin is warm, dry and intact. No rash noted. Psychiatric: Mood and affect are normal. Speech and behavior are normal.  ____________________________________________   LABS (all labs ordered are listed, but only abnormal results are displayed)  Labs Reviewed  WET PREP, GENITAL - Abnormal; Notable for the following:       Result Value   WBC, Wet Prep HPF POC MODERATE (*)  All other components within normal limits  COMPREHENSIVE METABOLIC PANEL - Abnormal; Notable for the following:    Glucose, Bld 112 (*)    All other components within normal limits  CBC - Abnormal; Notable for the following:    RDW 15.0 (*)    All other components within normal limits  URINALYSIS, COMPLETE (UACMP) WITH MICROSCOPIC - Abnormal; Notable for the following:    Color, Urine YELLOW (*)    APPearance TURBID (*)    Specific Gravity, Urine 1.031 (*)    Ketones, ur 5 (*)    Protein, ur 100 (*)    Bacteria, UA FEW (*)    Squamous Epithelial / LPF 6-30 (*)    All other components within normal limits  CHLAMYDIA/NGC RT PCR ([facility] ONLY)  LIPASE, BLOOD  POC URINE PREG, ED  POCT PREGNANCY, URINE   ____________________________________________  EKG  none  ____________________________________________  RADIOLOGY  none  ____________________________________________   PROCEDURES  Procedure(s) performed: None Procedures Critical Care performed:  None ____________________________________________   INITIAL IMPRESSION / ASSESSMENT  AND PLAN / ED COURSE  31 y.o. female with a history of GERD, anxiety, asthma, bipolar disorder who presents for evaluation of nausea, vomiting, and diarrhea x 1 week. Also complaining of dysuria and vaginal discharge. Pelvic exam within normal limits. Physical exam showing well-appearing patient, no distress, abdomen is soft with no tenderness throughout. Wet prep, gonorrhea and chlamydia are pending. UA concerning for UTI. We'll start treatment with Macrobid. We'll give IV fluids and IV Reglan for nausea. Presentation concerning for viral gastroenteritis  Clinical Course as of Dec 19 2136  Sat Dec 19, 2016  2135 Patient feels markedly improved. Wet prep negative. GC and chlamydia are pending. Patient was started on Macrobid for UTI. She is tolerating by mouth. We'll discharge home with Reglan and Macrobid.  [CV]    Clinical Course User Index [CV] Rudene Re, MD    Pertinent labs & imaging results that were available during my care of the patient were reviewed by me and considered in my medical decision making (see chart for details).    ____________________________________________   FINAL CLINICAL IMPRESSION(S) / ED DIAGNOSES  Final diagnoses:  Nausea vomiting and diarrhea  Acute cystitis without hematuria      NEW MEDICATIONS STARTED DURING THIS VISIT:  New Prescriptions   METOCLOPRAMIDE (REGLAN) 10 MG TABLET    Take 1 tablet (10 mg total) by mouth every 8 (eight) hours as needed for nausea.   NITROFURANTOIN, MACROCRYSTAL-MONOHYDRATE, (MACROBID) 100 MG CAPSULE    Take 1 capsule (100 mg total) by mouth 2 (two) times daily.     Note:  This document was prepared using Dragon voice recognition software and may include unintentional dictation errors.    Rudene Re, MD 12/19/16 2138

## 2016-12-19 NOTE — ED Triage Notes (Signed)
Pt reports has been vomiting for one week. Denies diarrhea. Denies abdominal pain. Pt reports was seen by Urgent Care Thursday and has been treated for bacterial vaginosis but does not know if the swab was positive or not.

## 2017-01-04 NOTE — Progress Notes (Deleted)
01/05/2017 7:52 AM   Jennifer Richmond February 29, 1986 [id number]  Referring provider: Remi Haggard, [place], [place] [address]  No chief complaint on file.   HPI: 31 yo WF who presents today requesting an urgent appointment for gross hematuria after completing antibiotics for an UTI.  We had last seen the patient in 10/2016 for back pain and vomiting  Non contrast CT scan performed on 11/06/2016 noted multiple punctate stones bilaterally.  No hydronephrosis.    She underwent ureteroscopy for a 6 mm UVJ stone, but the stone was not seen during the procedure and presumed past. She did have a papillary lesion found during the procedure and it was biopsied pathology report noted   BLADDER; BIOPSY:  - SUPERFICIAL SAMPLING OF A MINUTE PAPILLARY UROTHELIAL LESION, SEE  NOTE.  - MULTIPLE DEEPER LEVELS WERE EXAMINED.   Note: The differential diagnosis includes papilloma and papillary  urothelial neoplasm of low malignant potential.   A 24 hour urine was ordered, but it was not completed.    PMH: Past Medical History:  Diagnosis Date  . Abdominal pain   . Anxiety   . Asthma   . Back pain   . Bipolar disorder ([place])   . GERD (gastroesophageal reflux disease)   . Gross hematuria   . Kidney stones   . Panic attacks   . Panic attacks   . PTSD (post-traumatic stress disorder)   . Urinary hesitancy   . UTI (urinary tract infection)     Surgical History: Past Surgical History:  Procedure Laterality Date  . CYSTOSCOPY WITH BIOPSY  05/11/2016   Procedure: CYSTOSCOPY WITH BIOPSY;  Surgeon: [name], MD;  Location: [facility] ORS;  Service: Urology;;  . DILATION AND CURETTAGE OF UTERUS    . LAPAROSCOPIC OVARIAN CYSTECTOMY    . LAPAROSCOPIC TUBAL LIGATION Bilateral 06/05/2016   Procedure: LAPAROSCOPIC TUBAL LIGATION with bilateral salpingectomy;  Surgeon: Will Bonnet, MD;  Location: [facility] ORS;  Service: Gynecology;  Laterality: Bilateral;  . URETEROSCOPY  Right 05/11/2016   Procedure: URETEROSCOPY;  Surgeon: [name], MD;  Location: [facility] ORS;  Service: Urology;  Laterality: Right;    Home Medications:  Allergies as of 01/05/2017      Reactions   Shrimp [shellfish Allergy]       Medication List       Accurate as of 01/04/17  7:52 AM. Always use your most recent med list.          clonazePAM 1 MG tablet Commonly known as:  KLONOPIN Take 1 mg by mouth daily.   ibuprofen 600 MG tablet Commonly known as:  ADVIL,MOTRIN Take 1 tablet (600 mg total) by mouth every 6 (six) hours as needed for mild pain or cramping.   ibuprofen 600 MG tablet Commonly known as:  ADVIL,MOTRIN Take 1 tablet (600 mg total) by mouth every 8 (eight) hours as needed.   metoCLOPramide 10 MG tablet Commonly known as:  REGLAN Take 1 tablet (10 mg total) by mouth every 8 (eight) hours as needed for nausea.   oxyCODONE-acetaminophen 5-325 MG tablet Commonly known as:  ROXICET Take 1 tablet by mouth every 4 (four) hours as needed for moderate pain or severe pain.   prenatal multivitamin Tabs tablet Take 1 tablet by mouth daily at 12 noon.   traMADol 50 MG tablet Commonly known as:  ULTRAM Take 1 tablet (50 mg total) by mouth every 6 (six) hours as needed for moderate pain.  Allergies:  Allergies  Allergen Reactions  . Shrimp [Shellfish Allergy]     Family History: Family History  Problem Relation Age of Onset  . Diabetes Father   . Kidney nephrosis Father   . Seizures Mother   . Kidney cancer Neg Hx   . Prostate cancer Neg Hx   . Bladder Cancer Neg Hx     Social History:  reports that she has been smoking Cigarettes.  She has been smoking about 0.25 packs per day for the past 0.00 years. She has never used smokeless tobacco. She reports that she uses drugs, including Marijuana. She reports that she does not drink alcohol.  ROS:                                        Physical Exam: There were no vitals  taken for this visit.  Constitutional: Well nourished. Alert and oriented, No acute distress. HEENT: [place] AT, moist mucus membranes. Trachea midline, no masses. Cardiovascular: No clubbing, cyanosis, or edema. Respiratory: Normal respiratory effort, no increased work of breathing. GI: Abdomen is soft, non tender, non distended, no abdominal masses. Liver and spleen not palpable.  No hernias appreciated.  Stool sample for occult testing is not indicated.   GU: No CVA tenderness.  No bladder fullness or masses.   Skin: No rashes, bruises or suspicious lesions. Lymph: No cervical or inguinal adenopathy. Neurologic: Grossly intact, no focal deficits, moving all 4 extremities. Psychiatric: Normal mood and affect.  Laboratory Data: Lab Results  Component Value Date   WBC 9.4 12/19/2016   HGB 15.0 12/19/2016   HCT 45.1 12/19/2016   MCV 86.6 12/19/2016   PLT 239 12/19/2016    Lab Results  Component Value Date   CREATININE 0.89 12/19/2016    Lab Results  Component Value Date   AST 23 12/19/2016   Lab Results  Component Value Date   ALT 23 12/19/2016     Urinalysis *** See EPIC.    Pertinent Imaging: CLINICAL DATA:  Right flank pain  EXAM: CT ABDOMEN AND PELVIS WITHOUT CONTRAST  TECHNIQUE: Multidetector CT imaging of the abdomen and pelvis was performed following the standard protocol without IV contrast.  COMPARISON:  04/07/2016  FINDINGS: Lower chest: No acute abnormality.  Hepatobiliary: No focal liver abnormality is seen. No gallstones, gallbladder wall thickening, or biliary dilatation.  Pancreas: Unremarkable. No pancreatic ductal dilatation or surrounding inflammatory changes.  Spleen: Normal in size without focal abnormality.  Adrenals/Urinary Tract: Adrenal glands are within normal limits. Punctate stones in the upper pole of the left kidney. 2 mm stone mid left kidney. Punctate stones in the lower pole of the right kidney no hydronephrosis. No  definite ureteral stones. Bladder unremarkable.  Stomach/Bowel: Stomach is within normal limits. Appendix appears normal. No evidence of bowel wall thickening, distention, or inflammatory changes.  Vascular/Lymphatic: Aortic atherosclerosis. No enlarged abdominal or pelvic lymph nodes.  Reproductive: Decreased size of the uterus compared to prior. No adnexal masses.  Other: No free air. Nonspecific mild stranding within the pelvic. Small fat in the umbilicus  Musculoskeletal: No acute or significant osseous findings.  IMPRESSION: 1. No hydronephrosis or ureteral stones are visualized. There are multiple bilateral punctate stones present within both kidneys. 2. Nonspecific hazy stranding within the posterior pelvis. No colon wall thickening or evidence for adnexal mass. 3. Normal appendix   Electronically Signed   By: Madie Reno.D.  On: 11/06/2016 18:48  Assessment & Plan:    1. Bilateral nephrolithiasis  - Stone composition is   - obtain a 24 hour metabolic work up  2. Right flank pain  - ? Passage of stone  No Follow-up on file.  These notes generated with voice recognition software. I apologize for typographical errors.  Zara Council, [facility] [address] [place], [place] [address] [phone]

## 2017-01-05 ENCOUNTER — Ambulatory Visit: Payer: Medicaid Other | Admitting: Urology

## 2017-01-05 ENCOUNTER — Encounter: Payer: Self-pay | Admitting: Urology

## 2017-01-28 ENCOUNTER — Ambulatory Visit (INDEPENDENT_AMBULATORY_CARE_PROVIDER_SITE_OTHER): Payer: Medicaid Other

## 2017-01-28 ENCOUNTER — Telehealth: Payer: Self-pay

## 2017-01-28 VITALS — BP 99/67 | HR 96 | Ht 65.0 in | Wt 123.3 lb

## 2017-01-28 DIAGNOSIS — R109 Unspecified abdominal pain: Secondary | ICD-10-CM

## 2017-01-28 DIAGNOSIS — R300 Dysuria: Secondary | ICD-10-CM

## 2017-01-28 LAB — URINALYSIS, COMPLETE
BILIRUBIN UA: NEGATIVE
GLUCOSE, UA: NEGATIVE
KETONES UA: NEGATIVE
LEUKOCYTES UA: NEGATIVE
Nitrite, UA: NEGATIVE
PROTEIN UA: NEGATIVE
SPEC GRAV UA: 1.02 (ref 1.005–1.030)
Urobilinogen, Ur: 0.2 mg/dL (ref 0.2–1.0)
pH, UA: 7 (ref 5.0–7.5)

## 2017-01-28 LAB — MICROSCOPIC EXAMINATION: Bacteria, UA: NONE SEEN

## 2017-01-28 NOTE — Telephone Encounter (Signed)
Called patient. Informed urinalysis negative. Ordered culture per Shannon-PA. Advised would contact after culture results reviewed and instructions given per MD. Patient verbalized understanding.

## 2017-01-28 NOTE — Progress Notes (Signed)
Pt in office c/o urinary frequency, burning/painful urination, back/flank pain, lower abdominal pain, nausea, vomiting, urinary urgency x3 days.  Pt unsure if sx(s) from infection or b/c she started cycle today b/c she noticed same sx(s) last month when cycle started.  Pt provided urine sample. Advised she would be contacted by phone after urinalysis results reviewed and next instructions given. Pt verbalized understanding and stated [place] at the beach but ok to call mobile number.

## 2017-01-30 LAB — URINE CULTURE

## 2017-02-01 ENCOUNTER — Telehealth: Payer: Self-pay

## 2017-02-01 NOTE — Telephone Encounter (Signed)
-----   Message from [name], MD sent at 01/30/2017  2:33 PM EDT ----- Urine culture negative

## 2017-02-01 NOTE — Telephone Encounter (Signed)
Called patient to give lab results. No answer. No DPR on file.  Will try later.

## 2017-02-02 NOTE — Telephone Encounter (Signed)
Spoke to patient. Gave results. Patient verbalized understanding. Pt is at the beach, was in pain, went to ED due to kidney stone pain.

## 2017-02-18 ENCOUNTER — Ambulatory Visit (INDEPENDENT_AMBULATORY_CARE_PROVIDER_SITE_OTHER): Payer: Medicaid Other | Admitting: Obstetrics and Gynecology

## 2017-02-18 ENCOUNTER — Encounter: Payer: Self-pay | Admitting: Obstetrics and Gynecology

## 2017-02-18 VITALS — BP 100/60 | HR 72 | Ht 65.0 in | Wt 122.0 lb

## 2017-02-18 DIAGNOSIS — N760 Acute vaginitis: Secondary | ICD-10-CM

## 2017-02-18 DIAGNOSIS — Z113 Encounter for screening for infections with a predominantly sexual mode of transmission: Secondary | ICD-10-CM

## 2017-02-18 LAB — POCT WET PREP WITH KOH
Clue Cells Wet Prep HPF POC: NEGATIVE
KOH PREP POC: NEGATIVE
Trichomonas, UA: NEGATIVE
YEAST WET PREP PER HPF POC: NEGATIVE

## 2017-02-18 MED ORDER — FLUCONAZOLE 150 MG PO TABS: 150.0000 mg | ORAL_TABLET | Freq: Once | ORAL | 0 refills | Status: AC

## 2017-02-18 NOTE — Progress Notes (Signed)
Chief Complaint  Patient presents with  . bv    HPI:      [name] is a 31 y.o. [id number] who LMP was Patient's last menstrual period was 01/31/2017., presents today for recurrent BV and yeast vag for the past 10 months. Sx are mostly around her period, since the birth of her son. She has been followed by her PCP and treated with diflucan, terazol, metronidazole pills and gel with temporary relief. She was diagnosed with trich a couple months ago and treated. Today, she has itching, increased d/c, and mild odor for the past few days. She has not used any meds to treat. She has tried several different soaps due to recurrence and doesn't use dryer sheets. She is sex active, no new partners. She denies dyspareunia, urin sx, belly sx, fevers.     Patient Active Problem List   Diagnosis Date Noted  . Admission for sterilization 06/05/2016  . Labor and delivery, indication for care 04/06/2016  . Nausea and vomiting during pregnancy 03/11/2016  . Flank pain 03/05/2016  . Abdominal pain affecting pregnancy 02/25/2016  . Nephrolithiasis 02/25/2016    Family History  Problem Relation Age of Onset  . Diabetes Father   . Kidney nephrosis Father   . Seizures Mother   . Kidney cancer Neg Hx   . Prostate cancer Neg Hx   . Bladder Cancer Neg Hx     Social History   Social History  . Marital status: Legally Separated    Spouse name: N/A  . Number of children: N/A  . Years of education: N/A   Occupational History  . Not on file.   Social History Main Topics  . Smoking status: Current Every Day Smoker    Packs/day: 0.25    Years: 0.00    Types: Cigarettes  . Smokeless tobacco: Never Used  . Alcohol use No  . Drug use: Yes    Types: Marijuana     Comment: PT DENIES BUT HAD A + UDS FOR MARIJUANA IN 2013  . Sexual activity: Yes    Birth control/ protection: Surgical   Other Topics Concern  . Not on file   Social History Narrative  . No narrative on file     Current  Outpatient Prescriptions:  .  clonazePAM (KLONOPIN) 1 MG tablet, Take 1 mg by mouth daily., Disp: , Rfl:  .  ibuprofen (ADVIL,MOTRIN) 600 MG tablet, Take 1 tablet (600 mg total) by mouth every 6 (six) hours as needed for mild pain or cramping., Disp: 30 tablet, Rfl: 0 .  Prenatal Vit-Fe Fumarate-FA (PRENATAL MULTIVITAMIN) TABS tablet, Take 1 tablet by mouth daily at 12 noon., Disp: , Rfl:  .  fluconazole (DIFLUCAN) 150 MG tablet, Take 1 tablet (150 mg total) by mouth once., Disp: 1 tablet, Rfl: 0 .  ibuprofen (ADVIL,MOTRIN) 600 MG tablet, Take 1 tablet (600 mg total) by mouth every 8 (eight) hours as needed. (Patient not taking: Reported on 11/17/2016), Disp: 15 tablet, Rfl: 0 .  metoCLOPramide (REGLAN) 10 MG tablet, Take 1 tablet (10 mg total) by mouth every 8 (eight) hours as needed for nausea., Disp: 20 tablet, Rfl: 0 .  oxyCODONE-acetaminophen (ROXICET) 5-325 MG tablet, Take 1 tablet by mouth every 4 (four) hours as needed for moderate pain or severe pain. (Patient not taking: Reported on 11/17/2016), Disp: 30 tablet, Rfl: 0 .  traMADol (ULTRAM) 50 MG tablet, Take 1 tablet (50 mg total) by mouth every 6 (six) hours as needed  for moderate pain. (Patient not taking: Reported on 02/18/2017), Disp: 12 tablet, Rfl: 0  Review of Systems  Constitutional: Negative for fever.  Gastrointestinal: Positive for diarrhea. Negative for blood in stool, constipation, nausea and vomiting.  Genitourinary: Positive for frequency and vaginal discharge. Negative for dyspareunia, dysuria, flank pain, hematuria, urgency, vaginal bleeding and vaginal pain.  Musculoskeletal: Negative for back pain.  Skin: Negative for rash.     OBJECTIVE:   Vitals:  BP 100/60   Pulse 72   Ht 5\' 5"  (1.651 m)   Wt 122 lb (55.3 kg)   LMP 01/31/2017   BMI 20.30 kg/m   Physical Exam  Constitutional: She is oriented to person, place, and time and well-developed, well-nourished, and in no distress. Vital signs are normal.    Genitourinary: Uterus normal, cervix normal, right adnexa normal, left adnexa normal and vulva normal. Uterus is not enlarged. Cervix exhibits no motion tenderness and no tenderness. Right adnexum displays no mass and no tenderness. Left adnexum displays no mass and no tenderness. Vulva exhibits no erythema, no exudate, no lesion, no rash and no tenderness. Vagina exhibits no lesion. Thin  odorless  white and vaginal discharge found.  Neurological: She is oriented to person, place, and time.  Vitals reviewed.   Results: Results for orders placed or performed in visit on 02/18/17 (from the past 24 hour(s))  POCT Wet Prep with KOH     Status: Normal   Collection Time: 02/18/17 11:06 AM  Result Value Ref Range   Trichomonas, UA Negative    Clue Cells Wet Prep HPF POC neg    Epithelial Wet Prep HPF POC  Few, Moderate, Many, Too numerous to count   Yeast Wet Prep HPF POC neg    Bacteria Wet Prep HPF POC  Few   RBC Wet Prep HPF POC     WBC Wet Prep HPF POC     KOH Prep POC Negative Negative     Assessment/Plan: Acute vaginitis - Neg exam/wet prep, but pt sx. Rx diflucan. Check one swab AV, BV, yeast culture. Will f/u with resulst and tx plan. If yeast, may need maintenance tx. - Plan: fluconazole (DIFLUCAN) 150 MG tablet, POCT Wet Prep with KOH, Other/Misc lab test  Screening for STD (sexually transmitted disease) - Per pt request. Done on One Swab culture.     Meds ordered this encounter  Medications  . fluconazole (DIFLUCAN) 150 MG tablet    Sig: Take 1 tablet (150 mg total) by mouth once.    Dispense:  1 tablet    Refill:  0      Return if symptoms worsen or fail to improve.  Alicia B. Copland, PA-C 02/18/2017 11:07 AM

## 2017-02-23 ENCOUNTER — Telehealth: Payer: Self-pay

## 2017-02-23 NOTE — Telephone Encounter (Signed)
Pt c/o still having discharge, some itching and decreased odor. Pt c/o recurrent BV infections and recent treatment with metronidazole cream, per pt unable to take oral metronidazole. Please advise for treatment if indicated. Pt uses [facility]. Thank you.

## 2017-02-23 NOTE — Telephone Encounter (Signed)
Left msg for pt to call back regarding lab results from [facility]. Need to see if pt is still symptomatic after treatment for vaginal bacteria/yeast with diflucan.

## 2017-03-01 ENCOUNTER — Other Ambulatory Visit: Payer: Self-pay | Admitting: Obstetrics and Gynecology

## 2017-03-01 MED ORDER — METRONIDAZOLE 0.75 % VA GEL: VAGINAL | 2 refills | Status: DC

## 2017-03-01 NOTE — Telephone Encounter (Signed)
Just seeing this.  Agree with Elmo Putt.

## 2017-03-01 NOTE — Telephone Encounter (Signed)
It does not appear that SDJ reviewed this. Please review for patient. Thank you.

## 2017-03-01 NOTE — Telephone Encounter (Signed)
Pt aware of BV on One Swab. AV, yeast, and leukorrhea panel negative. Pt's sx occur about 1 wk before menses. Rx metrogel for 5 nights to treat current sx and then use once mid-cycle each month to see if prevents sx. If not, then do once wkly throughout month as preventive.

## 2017-12-09 ENCOUNTER — Ambulatory Visit: Payer: Medicaid Other

## 2017-12-09 ENCOUNTER — Ambulatory Visit
Admission: EM | Admit: 2017-12-09 | Discharge: 2017-12-09 | Disposition: A | Discharge status: Home / Self Care | Payer: Medicaid Other | Attending: Family Medicine | Admitting: Family Medicine

## 2017-12-09 ENCOUNTER — Encounter: Payer: Self-pay | Admitting: *Deleted

## 2017-12-09 DIAGNOSIS — K219 Gastro-esophageal reflux disease without esophagitis: Secondary | ICD-10-CM | POA: Insufficient documentation

## 2017-12-09 DIAGNOSIS — Z87442 Personal history of urinary calculi: Secondary | ICD-10-CM | POA: Diagnosis not present

## 2017-12-09 DIAGNOSIS — W01198A Fall on same level from slipping, tripping and stumbling with subsequent striking against other object, initial encounter: Secondary | ICD-10-CM

## 2017-12-09 DIAGNOSIS — F17210 Nicotine dependence, cigarettes, uncomplicated: Secondary | ICD-10-CM | POA: Diagnosis not present

## 2017-12-09 DIAGNOSIS — Z79899 Other long term (current) drug therapy: Secondary | ICD-10-CM | POA: Insufficient documentation

## 2017-12-09 DIAGNOSIS — F4310 Post-traumatic stress disorder, unspecified: Secondary | ICD-10-CM | POA: Insufficient documentation

## 2017-12-09 DIAGNOSIS — F319 Bipolar disorder, unspecified: Secondary | ICD-10-CM | POA: Insufficient documentation

## 2017-12-09 DIAGNOSIS — J45909 Unspecified asthma, uncomplicated: Secondary | ICD-10-CM | POA: Insufficient documentation

## 2017-12-09 DIAGNOSIS — M25531 Pain in right wrist: Secondary | ICD-10-CM | POA: Diagnosis not present

## 2017-12-09 NOTE — ED Provider Notes (Signed)
[facility]  CSN: [id number] Arrival date & time: 12/09/17  1915  History   Chief Complaint Chief Complaint  Patient presents with  . Arm Injury   HPI  32 year old female presents with right wrist/forearm pain.  Patient states she fell 2 days ago.  She tripped on a baby gate.  She slipped and fell on her outstretched right hand.  She states that since that time she has had right wrist pain and forearm pain.  She is concerned that she has a fracture.  Worse with range of motion.  No relieving factors.  No other associated symptoms.  No other complaints or concerns at this time.   Past Medical History:  Diagnosis Date  . Abdominal pain   . Anxiety   . Asthma   . Back pain   . Bipolar disorder ([place])   . GERD (gastroesophageal reflux disease)   . Gross hematuria   . Kidney stones   . Panic attacks   . Panic attacks   . PTSD (post-traumatic stress disorder)   . Urinary hesitancy   . UTI (urinary tract infection)     Patient Active Problem List   Diagnosis Date Noted  . Admission for sterilization 06/05/2016  . Labor and delivery, indication for care 04/06/2016  . Nausea and vomiting during pregnancy 03/11/2016  . Flank pain 03/05/2016  . Abdominal pain affecting pregnancy 02/25/2016  . Nephrolithiasis 02/25/2016    Past Surgical History:  Procedure Laterality Date  . CYSTOSCOPY WITH BIOPSY  05/11/2016   Procedure: CYSTOSCOPY WITH BIOPSY;  Surgeon: [name], MD;  Location: [facility] ORS;  Service: Urology;;  . DILATION AND CURETTAGE OF UTERUS    . LAPAROSCOPIC OVARIAN CYSTECTOMY    . LAPAROSCOPIC TUBAL LIGATION Bilateral 06/05/2016   Procedure: LAPAROSCOPIC TUBAL LIGATION with bilateral salpingectomy;  Surgeon: Will Bonnet, MD;  Location: [facility] ORS;  Service: Gynecology;  Laterality: Bilateral;  . URETEROSCOPY Right 05/11/2016   Procedure: URETEROSCOPY;  Surgeon: [name], MD;  Location: [facility] ORS;  Service: Urology;  Laterality: Right;    OB  History    Gravida  4   Para  2   Term  2   Preterm      AB  2   Living  2     SAB  1   TAB  1   Ectopic      Multiple  0   Live Births  2            Home Medications    Prior to Admission medications   Medication Sig Start Date End Date Taking? Authorizing Provider  clonazePAM (KLONOPIN) 1 MG tablet Take 1 mg by mouth daily.   Yes [provider]  ibuprofen (ADVIL,MOTRIN) 600 MG tablet Take 1 tablet (600 mg total) by mouth every 6 (six) hours as needed for mild pain or cramping. 06/05/16  Yes Will Bonnet, MD  ibuprofen (ADVIL,MOTRIN) 600 MG tablet Take 1 tablet (600 mg total) by mouth every 8 (eight) hours as needed. 11/13/16  Yes Sable Feil, PA-C  metroNIDAZOLE (METROGEL) 0.75 % vaginal gel Insert one applicatorful vaginally for 5 nights, then once mid-cycle monthly as preventive 6/0/10  Yes Copland, Alicia B, PA-C  pantoprazole (PROTONIX) 40 MG tablet Take 40 mg by mouth daily.   Yes [provider]  metoCLOPramide (REGLAN) 10 MG tablet Take 1 tablet (10 mg total) by mouth every 8 (eight) hours as needed for nausea. 12/19/16 12/22/16  Rudene Re, MD  oxyCODONE-acetaminophen (ROXICET) 5-325 MG tablet Take 1 tablet by mouth every 4 (four) hours as needed for moderate pain or severe pain. Patient not taking: Reported on 11/17/2016 06/05/16   Will Bonnet, MD    Family History Family History  Problem Relation Age of Onset  . Diabetes Father   . Kidney nephrosis Father   . Seizures Mother   . Kidney cancer Neg Hx   . Prostate cancer Neg Hx   . Bladder Cancer Neg Hx     Social History Social History   Tobacco Use  . Smoking status: Current Every Day Smoker    Packs/day: 0.25    Years: 0.00    Pack years: 0.00    Types: Cigarettes  . Smokeless tobacco: Never Used  Substance Use Topics  . Alcohol use: No  . Drug use: Yes    Types: Marijuana    Comment: PT DENIES BUT HAD A + UDS FOR MARIJUANA IN 2013      Allergies   Shrimp [shellfish allergy]   Review of Systems Review of Systems  Constitutional: Negative.   Musculoskeletal:       Right wrist, forearm pain.    Physical Exam Triage Vital Signs ED Triage Vitals  Enc Vitals Group     BP 12/09/17 1924 (!) 84/60     Pulse Rate 12/09/17 1924 86     Resp 12/09/17 1924 16     Temp 12/09/17 1924 98.4 F (36.9 C)     Temp Source 12/09/17 1924 Oral     SpO2 12/09/17 1924 100 %     Weight 12/09/17 1927 105 lb (47.6 kg)     Height 12/09/17 1927 5\' 5"  (1.651 m)     Head Circumference --      Peak Flow --      Pain Score 12/09/17 1925 5     Pain Loc --      Pain Edu? --      Excl. in [place]? --    Updated Vital Signs BP (!) 84/60 (BP Location: Left Arm)   Pulse 86   Temp 98.4 F (36.9 C) (Oral)   Resp 16   Ht 5\' 5"  (1.651 m)   Wt 105 lb (47.6 kg)   SpO2 100%   BMI 17.47 kg/m  Physical Exam  Constitutional: She is oriented to person, place, and time. She appears well-developed. No distress.  Pulmonary/Chest: Effort normal. No respiratory distress.  Musculoskeletal:  Right wrist -diffusely tender to palpation.  No ecchymosis.  No swelling.  Patient with distal forearm tenderness on the ulnar side.  Neurological: She is alert and oriented to person, place, and time.  Skin: No rash noted.  No bruising.  Psychiatric: She has a normal mood and affect. Her behavior is normal.  Nursing note and vitals reviewed.  UC Treatments / Results  Labs (all labs ordered are listed, but only abnormal results are displayed) Labs Reviewed - No data to display  EKG None Radiology Dg Forearm Right  Result Date: 12/09/2017 CLINICAL DATA:  Tripped and fell 2 days ago. Landed on right arm, c/o medial aspect right forearm pain. EXAM: RIGHT FOREARM - 2 VIEW COMPARISON:  None. FINDINGS: There is no evidence of fracture or other focal bone lesions. Soft tissues are unremarkable. IMPRESSION: Negative. Electronically Signed   By: Franki Cabot  M.D.   On: 12/09/2017 20:14   Dg Wrist Complete Right  Result Date: 12/09/2017 CLINICAL DATA:  Tripped and fell 2 days ago. Landed on  right arm, c/o medial aspect right forearm pain. EXAM: RIGHT WRIST - COMPLETE 3+ VIEW COMPARISON:  None. FINDINGS: Osseous alignment is normal. No fracture line or displaced fracture fragment seen. Adjacent soft tissues are unremarkable. IMPRESSION: Negative. Electronically Signed   By: Franki Cabot M.D.   On: 12/09/2017 20:15    Procedures Procedures (including critical care time)  Medications Ordered in UC Medications - No data to display   Initial Impression / Assessment and Plan / UC Course  I have reviewed the triage vital signs and the nursing notes.  Pertinent labs & imaging results that were available during my care of the patient were reviewed by me and considered in my medical decision making (see chart for details).    32 year old female presents with right wrist pain/forearm pain after a fall.  X-rays negative.  Supportive care and over-the-counter ibuprofen as needed.  Final Clinical Impressions(s) / UC Diagnoses   Final diagnoses:  Right wrist pain    ED Discharge Orders    None     Controlled Substance Prescriptions [place] Controlled Substance Registry consulted? Not Applicable   Coral Spikes, DO 12/09/17 2043

## 2017-12-09 NOTE — ED Triage Notes (Signed)
Tripped and fell 2 days ago. Landed on right arm, c/o medial aspect right forearm pain.

## 2017-12-09 NOTE — Discharge Instructions (Signed)
Rest.  Ibuprofen as needed.   Take care  Dr. Tameaka Eichhorn   

## 2018-01-16 ENCOUNTER — Emergency Department: Payer: Medicaid Other

## 2018-01-16 ENCOUNTER — Other Ambulatory Visit: Payer: Self-pay

## 2018-01-16 ENCOUNTER — Encounter: Payer: Self-pay | Admitting: Emergency Medicine

## 2018-01-16 ENCOUNTER — Emergency Department
Admission: EM | Admit: 2018-01-16 | Discharge: 2018-01-16 | Disposition: A | Discharge status: Home / Self Care | Payer: Medicaid Other | Attending: Emergency Medicine | Admitting: Emergency Medicine

## 2018-01-16 DIAGNOSIS — M545 Low back pain: Secondary | ICD-10-CM | POA: Diagnosis present

## 2018-01-16 DIAGNOSIS — F17210 Nicotine dependence, cigarettes, uncomplicated: Secondary | ICD-10-CM | POA: Insufficient documentation

## 2018-01-16 DIAGNOSIS — R1031 Right lower quadrant pain: Secondary | ICD-10-CM

## 2018-01-16 DIAGNOSIS — N739 Female pelvic inflammatory disease, unspecified: Secondary | ICD-10-CM | POA: Insufficient documentation

## 2018-01-16 DIAGNOSIS — N730 Acute parametritis and pelvic cellulitis: Secondary | ICD-10-CM

## 2018-01-16 DIAGNOSIS — Z79899 Other long term (current) drug therapy: Secondary | ICD-10-CM | POA: Insufficient documentation

## 2018-01-16 DIAGNOSIS — J45909 Unspecified asthma, uncomplicated: Secondary | ICD-10-CM | POA: Insufficient documentation

## 2018-01-16 DIAGNOSIS — Z3202 Encounter for pregnancy test, result negative: Secondary | ICD-10-CM | POA: Diagnosis not present

## 2018-01-16 LAB — LIPASE, BLOOD: Lipase: 32 U/L (ref 11–51)

## 2018-01-16 LAB — WET PREP, GENITAL
CLUE CELLS WET PREP: NONE SEEN
Sperm: NONE SEEN
Trich, Wet Prep: NONE SEEN
Yeast Wet Prep HPF POC: NONE SEEN

## 2018-01-16 LAB — COMPREHENSIVE METABOLIC PANEL
ALK PHOS: 44 U/L (ref 38–126)
ALT: 10 U/L — ABNORMAL LOW (ref 14–54)
ANION GAP: 4 — AB (ref 5–15)
AST: 16 U/L (ref 15–41)
Albumin: 3.7 g/dL (ref 3.5–5.0)
BUN: 14 mg/dL (ref 6–20)
CALCIUM: 8.7 mg/dL — AB (ref 8.9–10.3)
CO2: 26 mmol/L (ref 22–32)
CREATININE: 0.82 mg/dL (ref 0.44–1.00)
Chloride: 107 mmol/L (ref 101–111)
GFR calc Af Amer: >60 mL/min (ref >60)
GFR calc non Af Amer: >60 mL/min (ref >60)
Glucose, Bld: 101 mg/dL — ABNORMAL HIGH (ref 65–99)
Potassium: 4 mmol/L (ref 3.5–5.1)
SODIUM: 137 mmol/L (ref 135–145)
TOTAL PROTEIN: 6.2 g/dL — AB (ref 6.5–8.1)
Total Bilirubin: 0.5 mg/dL (ref 0.3–1.2)

## 2018-01-16 LAB — URINALYSIS, COMPLETE (UACMP) WITH MICROSCOPIC
Bacteria, UA: NONE SEEN
Bilirubin Urine: NEGATIVE
GLUCOSE, UA: NEGATIVE mg/dL
Hgb urine dipstick: NEGATIVE
KETONES UR: NEGATIVE mg/dL
Leukocytes, UA: NEGATIVE
Nitrite: NEGATIVE
PH: 5 (ref 5.0–8.0)
PROTEIN: NEGATIVE mg/dL
Specific Gravity, Urine: 1.023 (ref 1.005–1.030)

## 2018-01-16 LAB — CBC
HCT: 40.4 % (ref 35.0–47.0)
Hemoglobin: 13.5 g/dL (ref 12.0–16.0)
MCH: 30.2 pg (ref 26.0–34.0)
MCHC: 33.4 g/dL (ref 32.0–36.0)
MCV: 90.5 fL (ref 80.0–100.0)
Platelets: 216 10*3/uL (ref 150–440)
RBC: 4.47 MIL/uL (ref 3.80–5.20)
RDW: 13.8 % (ref 11.5–14.5)
WBC: 8.6 10*3/uL (ref 3.6–11.0)

## 2018-01-16 LAB — CHLAMYDIA/NGC RT PCR (ARMC ONLY)
CHLAMYDIA TR: NOT DETECTED
N GONORRHOEAE: NOT DETECTED

## 2018-01-16 LAB — POCT PREGNANCY, URINE: Preg Test, Ur: NEGATIVE

## 2018-01-16 MED ORDER — MORPHINE SULFATE (PF) 4 MG/ML IV SOLN
INTRAVENOUS | Status: AC
  Filled 2018-01-16: qty 1

## 2018-01-16 MED ORDER — CEFTRIAXONE SODIUM 250 MG IJ SOLR: 250.0000 mg | Freq: Once | INTRAMUSCULAR | Status: DC

## 2018-01-16 MED ORDER — MORPHINE SULFATE (PF) 4 MG/ML IV SOLN
4.0000 mg | Freq: Once | INTRAVENOUS | Status: AC
  Administered 2018-01-16: 4 mg via INTRAVENOUS
  Filled 2018-01-16: qty 1

## 2018-01-16 MED ORDER — ONDANSETRON 4 MG PO TBDP: 4.0000 mg | ORAL_TABLET | Freq: Three times a day (TID) | ORAL | 0 refills | Status: DC | PRN

## 2018-01-16 MED ORDER — MORPHINE SULFATE (PF) 4 MG/ML IV SOLN
4.0000 mg | Freq: Once | INTRAVENOUS | Status: AC
  Administered 2018-01-16: 4 mg via INTRAVENOUS

## 2018-01-16 MED ORDER — IOPAMIDOL (ISOVUE-300) INJECTION 61%
75.0000 mL | Freq: Once | INTRAVENOUS | Status: AC | PRN
  Administered 2018-01-16: 75 mL via INTRAVENOUS

## 2018-01-16 MED ORDER — SODIUM CHLORIDE 0.9 % IV SOLN
1.0000 g | Freq: Once | INTRAVENOUS | Status: AC
  Administered 2018-01-16: 1 g via INTRAVENOUS
  Filled 2018-01-16: qty 10

## 2018-01-16 MED ORDER — OXYCODONE-ACETAMINOPHEN 5-325 MG PO TABS: 1.0000 | ORAL_TABLET | Freq: Four times a day (QID) | ORAL | 0 refills | Status: DC | PRN

## 2018-01-16 MED ORDER — ONDANSETRON HCL 4 MG/2ML IJ SOLN
INTRAMUSCULAR | Status: AC
  Filled 2018-01-16: qty 2

## 2018-01-16 MED ORDER — DOXYCYCLINE HYCLATE 100 MG PO TABS
100.0000 mg | ORAL_TABLET | Freq: Once | ORAL | Status: AC
  Administered 2018-01-16: 100 mg via ORAL
  Filled 2018-01-16: qty 1

## 2018-01-16 MED ORDER — DOXYCYCLINE HYCLATE 100 MG PO TABS: 100.0000 mg | ORAL_TABLET | Freq: Two times a day (BID) | ORAL | 0 refills | Status: DC

## 2018-01-16 MED ORDER — ONDANSETRON HCL 4 MG/2ML IJ SOLN
4.0000 mg | Freq: Once | INTRAMUSCULAR | Status: AC
  Administered 2018-01-16: 4 mg via INTRAVENOUS
  Filled 2018-01-16: qty 2

## 2018-01-16 MED ORDER — IOPAMIDOL (ISOVUE-300) INJECTION 61%
15.0000 mL | INTRAVENOUS | Status: AC
  Administered 2018-01-16: 15 mL via ORAL

## 2018-01-16 MED ORDER — ONDANSETRON HCL 4 MG/2ML IJ SOLN
4.0000 mg | Freq: Once | INTRAMUSCULAR | Status: AC
  Administered 2018-01-16: 4 mg via INTRAVENOUS

## 2018-01-16 MED ORDER — SODIUM CHLORIDE 0.9 % IV BOLUS
1000.0000 mL | Freq: Once | INTRAVENOUS | Status: AC
  Administered 2018-01-16: 1000 mL via INTRAVENOUS

## 2018-01-16 NOTE — ED Notes (Signed)
Pt is currently drinking contrast for scan

## 2018-01-16 NOTE — ED Notes (Signed)
Pt up to BR at this time.

## 2018-01-16 NOTE — ED Provider Notes (Signed)
[facility] Emergency Department Provider Note   ____________________________________________   First MD Initiated Contact with Patient 01/16/18 1610     (approximate)  I have reviewed the triage vital signs and the nursing notes.   HISTORY  Chief Complaint Back Pain; Dysuria; and Abdominal Pain    HPI Jennifer Richmond is a 32 y.o. female Patient comes in complaining of right-sided low back pain and urinary frequency urgency and dysuria and right lower abdominal pain. She was taking antibiotics for UTI nitrofurantoin lost them and stopped for a few days restarted and finished them today. She has a history of kidney stones. She also took medications for yeast infection while taking the antibiotics for UTI. This pain today came on fairly suddenly. She has been having pain intermittently and this dysuria intermittently for the last week or so.   Past Medical History:  Diagnosis Date  . Abdominal pain   . Anxiety   . Asthma   . Back pain   . Bipolar disorder ([place])   . GERD (gastroesophageal reflux disease)   . Gross hematuria   . Kidney stones   . Panic attacks   . Panic attacks   . PTSD (post-traumatic stress disorder)   . Urinary hesitancy   . UTI (urinary tract infection)     Patient Active Problem List   Diagnosis Date Noted  . Admission for sterilization 06/05/2016  . Labor and delivery, indication for care 04/06/2016  . Nausea and vomiting during pregnancy 03/11/2016  . Flank pain 03/05/2016  . Abdominal pain affecting pregnancy 02/25/2016  . Nephrolithiasis 02/25/2016    Past Surgical History:  Procedure Laterality Date  . CYSTOSCOPY WITH BIOPSY  05/11/2016   Procedure: CYSTOSCOPY WITH BIOPSY;  Surgeon: [name], MD;  Location: [facility] ORS;  Service: Urology;;  . DILATION AND CURETTAGE OF UTERUS    . LAPAROSCOPIC OVARIAN CYSTECTOMY    . LAPAROSCOPIC TUBAL LIGATION Bilateral 06/05/2016   Procedure: LAPAROSCOPIC TUBAL LIGATION with  bilateral salpingectomy;  Surgeon: Will Bonnet, MD;  Location: [facility] ORS;  Service: Gynecology;  Laterality: Bilateral;  . URETEROSCOPY Right 05/11/2016   Procedure: URETEROSCOPY;  Surgeon: [name], MD;  Location: [facility] ORS;  Service: Urology;  Laterality: Right;    Prior to Admission medications   Medication Sig Start Date End Date Taking? Authorizing Provider  clonazePAM (KLONOPIN) 1 MG tablet Take 1 mg by mouth 2 (two) times daily as needed for anxiety.    Yes [provider]  ibuprofen (ADVIL,MOTRIN) 600 MG tablet Take 1 tablet (600 mg total) by mouth every 8 (eight) hours as needed. 11/13/16  Yes Sable Feil, PA-C  pantoprazole (PROTONIX) 40 MG tablet Take 40 mg by mouth daily.   Yes [provider]  doxycycline (VIBRA-TABS) 100 MG tablet Take 1 tablet (100 mg total) by mouth 2 (two) times daily. 01/16/18   Nena Polio, MD  metroNIDAZOLE (METROGEL) 0.75 % vaginal gel Insert one applicatorful vaginally for 5 nights, then once mid-cycle monthly as preventive Patient not taking: Reported on 01/16/2018 01/30/66   Copland, Elmo Putt B, PA-C  ondansetron (ZOFRAN ODT) 4 MG disintegrating tablet Take 1 tablet (4 mg total) by mouth every 8 (eight) hours as needed for nausea or vomiting. 01/16/18   Nena Polio, MD  oxyCODONE-acetaminophen (PERCOCET/ROXICET) 5-325 MG tablet Take 1 tablet by mouth every 6 (six) hours as needed for severe pain. 01/16/18   Nena Polio, MD  oxyCODONE-acetaminophen (ROXICET) 5-325 MG tablet Take 1 tablet by  mouth every 4 (four) hours as needed for moderate pain or severe pain. Patient not taking: Reported on 11/17/2016 06/05/16   Will Bonnet, MD    Allergies Clindamycin/lincomycin and Shrimp [shellfish allergy]  Family History  Problem Relation Age of Onset  . Diabetes Father   . Kidney nephrosis Father   . Seizures Mother   . Kidney cancer Neg Hx   . Prostate cancer Neg Hx   . Bladder Cancer Neg Hx     Social  History Social History   Tobacco Use  . Smoking status: Current Every Day Smoker    Packs/day: 0.25    Years: 0.00    Pack years: 0.00    Types: Cigarettes  . Smokeless tobacco: Never Used  Substance Use Topics  . Alcohol use: No  . Drug use: Yes    Types: Marijuana    Comment: PT DENIES BUT HAD A + UDS FOR MARIJUANA IN 2013    Review of Systems  Constitutional: No fever/chills Eyes: No visual changes. ENT: No sore throat. Cardiovascular: Denies chest pain. Respiratory: Denies shortness of breath. Gastrointestinal:right lower abdominal pain.   nausea, no vomiting.  No diarrhea.  No constipation. Genitourinary:  dysuria. Musculoskeletal: see history of present illness Skin: Negative for rash. Neurological: Negative for headaches, focal weakness   ____________________________________________   PHYSICAL EXAM:  VITAL SIGNS: ED Triage Vitals  Enc Vitals Group     BP 01/16/18 1600 108/72     Pulse Rate 01/16/18 1600 (!) 125     Resp 01/16/18 1600 20     Temp 01/16/18 1600 98.2 F (36.8 C)     Temp Source 01/16/18 1600 Oral     SpO2 01/16/18 1600 96 %     Weight 01/16/18 1601 105 lb (47.6 kg)     Height 01/16/18 1601 5\' 5"  (1.651 m)     Head Circumference --      Peak Flow --      Pain Score 01/16/18 1607 6     Pain Loc --      Pain Edu? --      Excl. in [place]? --     Constitutional: Alert and oriented. Well appearing and in no acute distress. Eyes: Conjunctivae are normal.  Head: Atraumatic. Nose: No congestion/rhinnorhea. Mouth/Throat: Mucous membranes are moist.  Oropharynx non-erythematous. Neck: No stridor. Cardiovascular: Normal rate, regular rhythm. Grossly normal heart sounds.  Good peripheral circulation. Respiratory: Normal respiratory effort.  No retractions. Lungs CTAB. Gastrointestinal: Soft tender palpation right lower quadrant is very low in the right lower quadrant. No distention. No abdominal bruits. No CVA tenderness. Genitourinary: normal  perineum and vagina there is cervical motion tenderness and adnexal tenderness Musculoskeletal: No lower extremity tenderness nor edema.  No joint effusions. Neurologic:  Normal speech and language. No gross focal neurologic deficits are appreciated. No gait instability. Skin:  Skin is warm, dry and intact. No rash noted. Psychiatric: Mood and affect are normal. Speech and behavior are normal.  ____________________________________________   LABS (all labs ordered are listed, but only abnormal results are displayed)  Labs Reviewed  COMPREHENSIVE METABOLIC PANEL - Abnormal; Notable for the following components:      Result Value   Glucose, Bld 101 (*)    Calcium 8.7 (*)    Total Protein 6.2 (*)    ALT 10 (*)    Anion gap 4 (*)    All other components within normal limits  URINALYSIS, COMPLETE (UACMP) WITH MICROSCOPIC - Abnormal; Notable for the following  components:   Color, Urine AMBER (*)    APPearance HAZY (*)    All other components within normal limits  WET PREP, GENITAL  CHLAMYDIA/NGC RT PCR ([facility] ONLY)  LIPASE, BLOOD  CBC  POC URINE PREG, ED  POCT PREGNANCY, URINE   ____________________________________________  EKG   ____________________________________________  RADIOLOGY  ED MD interprultrasound is normal and she has right lower quadrant pain and no sign of kidney stone wanted a CT.ultrasound shows no problems with blood flow  Official radiology report(s): US Pelvis (transabdominal Only)  Result Date: 01/16/2018 CLINICAL DATA:  Right lower quadrant pain EXAM: TRANSABDOMINAL ULTRASOUND OF PELVIS DOPPLER ULTRASOUND OF OVARIES TECHNIQUE: Transabdominal ultrasound examination of the pelvis was performed including evaluation of the uterus, ovaries, adnexal regions, and pelvic cul-de-sac. Color and duplex Doppler ultrasound was utilized to evaluate blood flow to the ovaries. COMPARISON:  None. FINDINGS: Uterus Measurements: 8 x 5 x 5.2 cm.  No fibroids or other  mass visualized. Endometrium Thickness: 7.1 mm.  No focal abnormality visualized. Right ovary Measurements: 3.3 x 2.7 x 3 cm.  Normal appearance/no adnexal mass. Left ovary Measurements: 3.7 x 1.9 x 2.1 cm. Normal appearance/no adnexal mass. Pulsed Doppler evaluation of both ovaries demonstrates normal low-resistance arterial and venous waveforms. Other findings No abnormal free fluid. IMPRESSION: 1. No ovarian torsion. Normal pelvic ultrasound. Electronically Signed   By: Kathreen Devoid   On: 01/16/2018 21:15   Ct Abdomen Pelvis W Contrast  Result Date: 01/16/2018 CLINICAL DATA:  Acute onset of right flank pain and lower back pain. Nausea. Decreased urinary output. EXAM: CT ABDOMEN AND PELVIS WITH CONTRAST TECHNIQUE: Multidetector CT imaging of the abdomen and pelvis was performed using the standard protocol following bolus administration of intravenous contrast. CONTRAST:  34mL ISOVUE-300 IOPAMIDOL (ISOVUE-300) INJECTION 61% COMPARISON:  CT of the abdomen and pelvis from 11/06/2016, and renal ultrasound performed earlier today at 4:39 p.m. FINDINGS: Lower chest: The visualized lung bases are grossly clear. The visualized portions of the mediastinum are unremarkable. Hepatobiliary: The liver is unremarkable in appearance. The gallbladder is unremarkable in appearance. The common bile duct remains normal in caliber. Pancreas: The pancreas is within normal limits. Spleen: The spleen is unremarkable in appearance. Adrenals/Urinary Tract: The adrenal glands are unremarkable in appearance. The kidneys are within normal limits. There is no evidence of hydronephrosis. No renal or ureteral stones are identified. No perinephric stranding is seen. Stomach/Bowel: The stomach is unremarkable in appearance. The small bowel is within normal limits. The appendix is normal in caliber, without evidence of appendicitis. The colon is unremarkable in appearance. Vascular/Lymphatic: The abdominal aorta is unremarkable in appearance.  The inferior vena cava is grossly unremarkable. No retroperitoneal lymphadenopathy is seen. No pelvic sidewall lymphadenopathy is identified. Reproductive: The bladder is decompressed and not well assessed. There is diffuse inflammation and edema at the level of the cervix, of uncertain significance. The uterus is grossly unremarkable. Bilateral ovarian follicles are noted. The ovaries are grossly unremarkable in appearance. Trace free fluid within the pelvis is likely physiologic in nature. Other: No additional soft tissue abnormalities are seen. Musculoskeletal: No acute osseous abnormalities are identified. The visualized musculature is unremarkable in appearance. IMPRESSION: Apparent diffuse inflammation and edema noted at the level of the cervix, of uncertain significance. Would correlate clinically for evidence of pelvic inflammatory disease. Pap smear would be helpful, if not recently performed, to exclude underlying mass. Electronically Signed   By: Garald Balding M.D.   On: 01/16/2018 18:54   US Renal  Result  Date: 01/16/2018 CLINICAL DATA:  Renal colic. EXAM: RENAL / URINARY TRACT ULTRASOUND COMPLETE COMPARISON:  Body CT 11/06/2016 FINDINGS: Right Kidney: Length: 10.6 cm. Echogenicity within normal limits. No mass or hydronephrosis visualized. Left Kidney: Length: 10.4 cm. Echogenicity within normal limits. No mass or hydronephrosis visualized. Bladder: Nondistended and therefore not well visualized. IMPRESSION: Normal appearance of the kidneys. Electronically Signed   By: Fidela Salisbury M.D.   On: 01/16/2018 17:03   US Pelvic Doppler (torsion R/o Or Mass Arterial Flow)  Result Date: 01/16/2018 CLINICAL DATA:  Right lower quadrant pain EXAM: TRANSABDOMINAL ULTRASOUND OF PELVIS DOPPLER ULTRASOUND OF OVARIES TECHNIQUE: Transabdominal ultrasound examination of the pelvis was performed including evaluation of the uterus, ovaries, adnexal regions, and pelvic cul-de-sac. Color and duplex Doppler  ultrasound was utilized to evaluate blood flow to the ovaries. COMPARISON:  None. FINDINGS: Uterus Measurements: 8 x 5 x 5.2 cm.  No fibroids or other mass visualized. Endometrium Thickness: 7.1 mm.  No focal abnormality visualized. Right ovary Measurements: 3.3 x 2.7 x 3 cm.  Normal appearance/no adnexal mass. Left ovary Measurements: 3.7 x 1.9 x 2.1 cm. Normal appearance/no adnexal mass. Pulsed Doppler evaluation of both ovaries demonstrates normal low-resistance arterial and venous waveforms. Other findings No abnormal free fluid. IMPRESSION: 1. No ovarian torsion. Normal pelvic ultrasound. Electronically Signed   By: Kathreen Devoid   On: 01/16/2018 21:15    ____________________________________________   PROCEDURES  Procedure(s) performed:   Procedures  Critical Care performed:   ____________________________________________   INITIAL IMPRESSION / ASSESSMENT AND PLAN / ED COURSE  patient came in with right lower quadrant pain and nausea. This appears to be due to PID. I will treat her with Rocephin and doxycycline she will return as needed.         ____________________________________________   FINAL CLINICAL IMPRESSION(S) / ED DIAGNOSES  Final diagnoses:  PID (acute pelvic inflammatory disease)     ED Discharge Orders        Ordered    doxycycline (VIBRA-TABS) 100 MG tablet  2 times daily     01/16/18 2132    oxyCODONE-acetaminophen (PERCOCET/ROXICET) 5-325 MG tablet  Every 6 hours PRN     01/16/18 2132    ondansetron (ZOFRAN ODT) 4 MG disintegrating tablet  Every 8 hours PRN     01/16/18 2132       Note:  This document was prepared using Dragon voice recognition software and may include unintentional dictation errors.    Nena Polio, MD 01/16/18 2133

## 2018-01-16 NOTE — Discharge Instructions (Addendum)
please take the doxycycline one pill twice a day until they're gone. Remember concussion December more easily while you're taking it. Lee's return for worse pain fever vomiting. use the Percocet 1 pill 4 times a day as needed for pain and the Zofran 13 times a day melt on your tongue if needed for nausea. Once the Percocet runs out you can use Tylenol or Advil.please return for worse pain fever or vomiting. Please follow-up with your doctor in about 2 weeks to make sure you're doing well.

## 2018-01-16 NOTE — ED Triage Notes (Addendum)
Pt c/o back pain, urinary frequency, burning with urination, and R flank pain. Pt states took medication for UTI but stopped for a few days and restarted, finished today. Pt states hx of kidney stones. Pt also states took medication for a yeast infection while taking abx for UTI. Pt also c/o RLQ abdominal pain at this time.

## 2018-01-16 NOTE — ED Notes (Signed)
Pt to US at this time.

## 2018-03-08 ENCOUNTER — Ambulatory Visit: Payer: Self-pay | Admitting: Obstetrics & Gynecology

## 2018-03-10 ENCOUNTER — Ambulatory Visit (INDEPENDENT_AMBULATORY_CARE_PROVIDER_SITE_OTHER): Payer: Medicaid Other | Admitting: Maternal Newborn

## 2018-03-10 ENCOUNTER — Encounter: Payer: Self-pay | Admitting: Maternal Newborn

## 2018-03-10 ENCOUNTER — Other Ambulatory Visit (HOSPITAL_COMMUNITY)
Admission: RE | Admit: 2018-03-10 | Discharge: 2018-03-10 | Disposition: A | Discharge status: Home / Self Care | Payer: Medicaid Other | Source: Ambulatory Visit | Attending: Maternal Newborn | Admitting: Maternal Newborn

## 2018-03-10 VITALS — BP 100/60 | HR 78 | Ht 65.0 in | Wt 101.0 lb

## 2018-03-10 DIAGNOSIS — Z01419 Encounter for gynecological examination (general) (routine) without abnormal findings: Secondary | ICD-10-CM

## 2018-03-10 DIAGNOSIS — Z0001 Encounter for general adult medical examination with abnormal findings: Secondary | ICD-10-CM | POA: Diagnosis not present

## 2018-03-10 DIAGNOSIS — Z1151 Encounter for screening for human papillomavirus (HPV): Secondary | ICD-10-CM | POA: Diagnosis not present

## 2018-03-10 DIAGNOSIS — R300 Dysuria: Secondary | ICD-10-CM | POA: Diagnosis not present

## 2018-03-10 DIAGNOSIS — R42 Dizziness and giddiness: Secondary | ICD-10-CM

## 2018-03-10 DIAGNOSIS — N898 Other specified noninflammatory disorders of vagina: Secondary | ICD-10-CM | POA: Diagnosis not present

## 2018-03-10 DIAGNOSIS — Z124 Encounter for screening for malignant neoplasm of cervix: Secondary | ICD-10-CM | POA: Insufficient documentation

## 2018-03-10 DIAGNOSIS — Z113 Encounter for screening for infections with a predominantly sexual mode of transmission: Secondary | ICD-10-CM

## 2018-03-10 LAB — POCT URINALYSIS DIPSTICK
Bilirubin, UA: NEGATIVE
Blood, UA: NEGATIVE
GLUCOSE UA: NEGATIVE
KETONES UA: NEGATIVE
Leukocytes, UA: NEGATIVE
NITRITE UA: NEGATIVE
PROTEIN UA: POSITIVE — AB
SPEC GRAV UA: 1.02 (ref 1.010–1.025)
Urobilinogen, UA: NEGATIVE E.U./dL — AB
pH, UA: 6.5 (ref 5.0–8.0)

## 2018-03-10 NOTE — Progress Notes (Signed)
Gynecology Annual Exam  PCP: Remi Haggard, FNP  Chief Complaint:  Chief Complaint  Patient presents with  . Gynecologic Exam    since delivery/tubal has had reocurrent bacterial/yeast inf. around period    History of Present Illness: Patient is a 32 y.o. [id number] presenting for an annual exam.  LMP: Patient's last menstrual period was 02/24/2018 (approximate). Average Interval: regular, 30 days Duration of flow: several days Heavy Menses: no Clots: no Intermenstrual Bleeding: no Postcoital Bleeding: no Dysmenorrhea: no  The patient is not currently sexually active. She uses tubal ligation for contraception. She denies dyspareunia.  The patient does perform self breast exams.   The patient denies current symptoms of depression.    Review of Systems  Constitutional: Negative.   HENT: Negative.   Eyes: Negative.   Respiratory: Negative for cough, shortness of breath and wheezing.   Cardiovascular: Negative for chest pain and palpitations.  Gastrointestinal: Negative.   Genitourinary: Positive for dysuria and frequency.  Musculoskeletal: Negative.   Skin: Negative.   Neurological: Positive for dizziness.  Psychiatric/Behavioral: Negative.   All other systems reviewed and are negative.   Past Medical History:  Past Medical History:  Diagnosis Date  . Abdominal pain   . Anxiety   . Asthma   . Back pain   . Bipolar disorder ([place])   . GERD (gastroesophageal reflux disease)   . Gross hematuria   . Kidney stones   . Panic attacks   . Panic attacks   . PTSD (post-traumatic stress disorder)   . Urinary hesitancy   . UTI (urinary tract infection)     Past Surgical History:  Past Surgical History:  Procedure Laterality Date  . CYSTOSCOPY WITH BIOPSY  05/11/2016   Procedure: CYSTOSCOPY WITH BIOPSY;  Surgeon: [name], MD;  Location: [facility] ORS;  Service: Urology;;  . DILATION AND CURETTAGE OF UTERUS    . LAPAROSCOPIC OVARIAN CYSTECTOMY    . LAPAROSCOPIC  TUBAL LIGATION Bilateral 06/05/2016   Procedure: LAPAROSCOPIC TUBAL LIGATION with bilateral salpingectomy;  Surgeon: Will Bonnet, MD;  Location: [facility] OR[facility];  Service: Gynecology;  Laterality: Bilateral;  . URETEROSCOPY Right 05/11/2016   Procedure: URETEROSCOPY;  Surgeon: [name], MD;  Location: [facility] OR[facility];  Service: Urology;  Laterality: Right;    Gynecologic History:  Patient's last menstrual period was 02/24/2018 (approximate). Contraception: tubal ligation Last Pap: unknown  Obstetric History: [id number]  Family History:  Family History  Problem Relation Age of Onset  . Diabetes Father   . Kidney nephrosis Father   . Seizures Mother   . Cancer Maternal Grandfather 50       pancreatic and colon 10s  . Cancer Paternal Grandmother        ovarian  . Kidney cancer Neg Hx   . Prostate cancer Neg Hx   . Bladder Cancer Neg Hx     Social History:  Social History   Socioeconomic History  . Marital status: Legally Separated    Spouse name: Not on file  . Number of children: 2  . Years of education: 16  . Highest education level: Not on file  Occupational History  . Occupation: laid off  Social Needs  . Financial resource strain: Not on file  . Food insecurity:    Worry: Not on file    Inability: Not on file  . Transportation needs:    Medical: Not on file    Non-medical: Not on file  Tobacco Use  . Smoking status: Current Every Day  Smoker    Packs/day: 0.00    Years: 0.00    Pack years: 0.00    Types: Cigarettes  . Smokeless tobacco: Never Used  . Tobacco comment: 1-2/day  Substance and Sexual Activity  . Alcohol use: No  . Drug use: Yes    Types: Marijuana    Comment: PT DENIES BUT HAD A + UDS FOR MARIJUANA IN 2013  . Sexual activity: Not Currently    Birth control/protection: Surgical    Comment: tubal  Lifestyle  . Physical activity:    Days per week: Not on file    Minutes per session: Not on file  . Stress: Not on file  Relationships  . Social  connections:    Talks on phone: Not on file    Gets together: Not on file    Attends religious service: Not on file    Active member of club or organization: Not on file    Attends meetings of clubs or organizations: Not on file    Relationship status: Not on file  . Intimate partner violence:    Fear of current or ex partner: Not on file    Emotionally abused: Not on file    Physically abused: Not on file    Forced sexual activity: Not on file  Other Topics Concern  . Not on file  Social History Narrative  . Not on file    Allergies:  Allergies  Allergen Reactions  . Clindamycin/Lincomycin     Possibly CLINDAMYCIN vaginal gel. Patient uncertain of drug  . Shrimp [Shellfish Allergy]     Medications: Prior to Admission medications   Medication Sig Start Date End Date Taking? Authorizing Provider  clonazePAM (KLONOPIN) 1 MG tablet Take 1 mg by mouth 2 (two) times daily as needed for anxiety.    Yes [provider]  ibuprofen (ADVIL,MOTRIN) 600 MG tablet Take 1 tablet (600 mg total) by mouth every 8 (eight) hours as needed. 11/13/16  Yes Sable Feil, PA-C  pantoprazole (PROTONIX) 40 MG tablet Take 40 mg by mouth daily.   Yes [provider]    Physical Exam Vitals: Blood pressure 100/60, pulse 78, height 5\' 5"  (1.651 m), weight 101 lb (45.8 kg), last menstrual period 02/24/2018, not currently breastfeeding.  General: NAD HEENT: normocephalic, anicteric Thyroid: no enlargement, no palpable nodules Pulmonary: No increased work of breathing, CTAB Cardiovascular: RRR, no murmurs, rubs, or gallops Breast: Breasts symmetrical, no tenderness, no palpable nodules or masses, no skin or nipple retraction present, no nipple discharge.  No axillary or supraclavicular lymphadenopathy. Abdomen: soft, non-tender, non-distended.  Umbilicus without lesions.  No hepatomegaly, splenomegaly or masses palpable. No evidence of hernia  Genitourinary:  External: Normal external  female genitalia.  Normal  urethral meatus, normal Bartholin's and Skene's glands.    Vagina: Normal vaginal mucosa, no evidence of prolapse.    Cervix: Grossly normal in appearance, no bleeding  Uterus: Non-enlarged, mobile, normal contour.  No CMT  Adnexa: ovaries non-enlarged, no adnexal masses  Rectal: deferred  Lymphatic: no evidence of inguinal lymphadenopathy Extremities: no edema, erythema, or tenderness Neurologic: Grossly intact Psychiatric: mood appropriate, affect full  Assessment: 32 y.o. [id number] routine annual exam, complains of recurrent yeast and BV infections around her cycle and some frequency and pain when urinating.  Plan: Problem List Items Addressed This Visit    None    Visit Diagnoses    Dysuria    -  Primary   Relevant Orders   Urine Culture (Completed)  POCT urinalysis dipstick (Completed)   Women's annual routine gynecological examination       Pap smear for cervical cancer screening       Relevant Orders   Cytology - PAP (Completed)   Screen for STD (sexually transmitted disease)       Relevant Orders   Cytology - PAP (Completed)   Vaginal discharge       Relevant Orders   Cervicovaginal ancillary only (Completed)      1) STI screening was offered and accepted.  2) ASCCP guidelines and rationale discussed.  Patient opts for yearly screening interval.  3) Contraception - BTL  4) Routine healthcare maintenance including cholesterol, diabetes screening managed by PCP  5) Swab sent for BV/yeast due to vaginal discharge reported by patient. She is frustrated by these problems and is taking  measures to prevent infection, including abstaining from intercourse. Plan treatment with Solosec if BV is positive and discussed medication with her.  6) History of recurrent UTI, sent culture today.  7) Follow up 1 year for routine annual exam.  Avel Sensor, CNM 03/10/2018

## 2018-03-12 LAB — URINE CULTURE

## 2018-03-14 LAB — CERVICOVAGINAL ANCILLARY ONLY
BACTERIAL VAGINITIS: NEGATIVE
Candida vaginitis: NEGATIVE

## 2018-03-15 ENCOUNTER — Telehealth: Payer: Self-pay

## 2018-03-15 LAB — CYTOLOGY - PAP
CHLAMYDIA, DNA PROBE: NEGATIVE
DIAGNOSIS: NEGATIVE
HPV (WINDOPATH): NOT DETECTED
NEISSERIA GONORRHEA: NEGATIVE
Trichomonas: NEGATIVE

## 2018-03-15 NOTE — Telephone Encounter (Signed)
Pt inquiring if test results from 03/10/18 have returned. VO#[phone]

## 2018-03-15 NOTE — Telephone Encounter (Signed)
This encounter was created in error - please disregard.

## 2018-03-21 ENCOUNTER — Encounter: Payer: Self-pay | Admitting: Maternal Newborn

## 2018-07-02 IMAGING — CR DG FOREARM 2V*R*
2 series · 2 of 2 positions shown · non-contrast
Comparison: None.

CLINICAL DATA: Tripped and fell 2 days ago. Landed on right arm,
c/o medial aspect right forearm pain.

EXAM:
RIGHT FOREARM - 2 VIEW

[forearm ap]
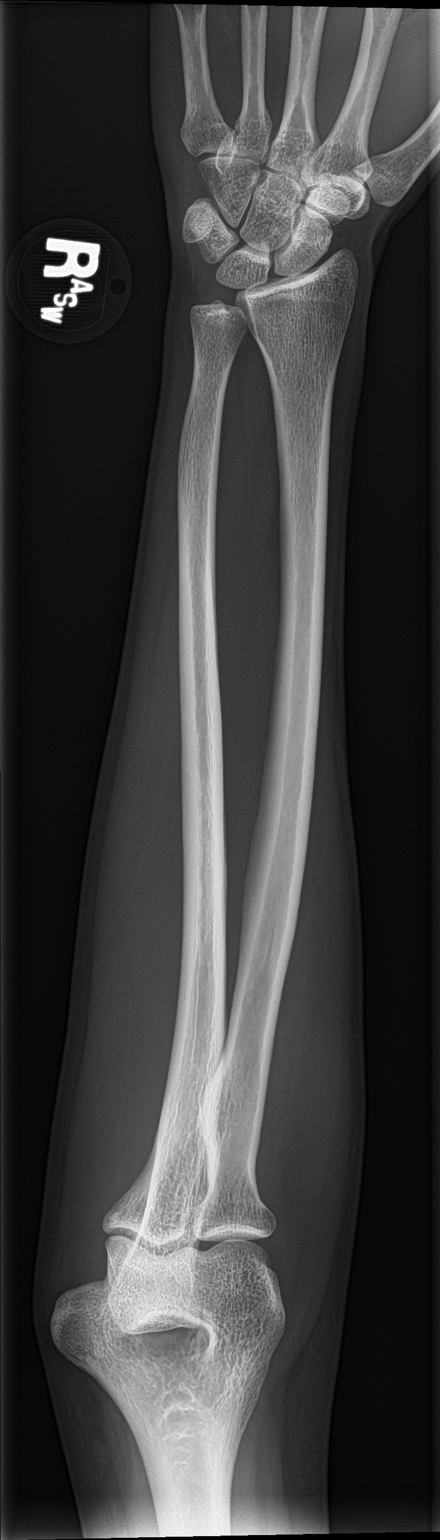

[forearm lat]
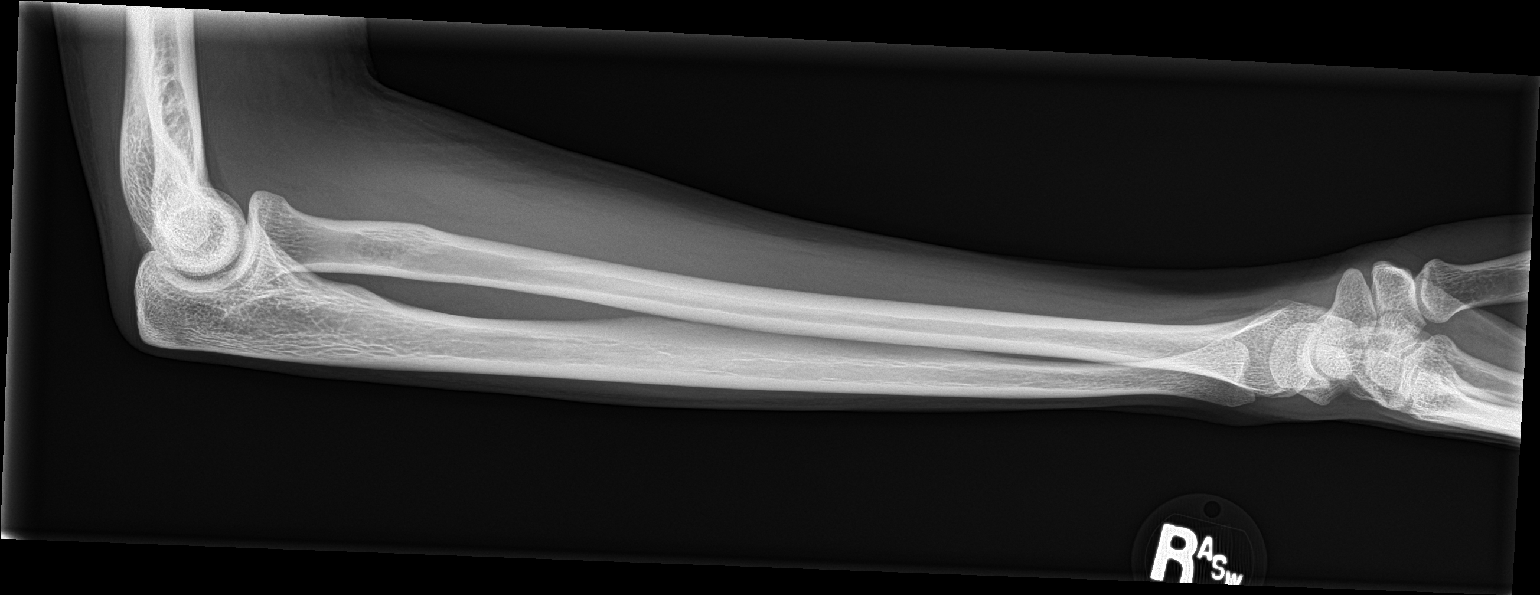

[2 of 2 positions shown; findings below may reference images not displayed]

FINDINGS: There is no evidence of fracture or other focal bone lesions. Soft
tissues are unremarkable.
IMPRESSION: Negative.

## 2018-07-02 IMAGING — CR DG WRIST COMPLETE 3+V*R*
4 series · 4 of 4 positions shown · non-contrast
Comparison: None.

CLINICAL DATA: Tripped and fell 2 days ago. Landed on right arm,
c/o medial aspect right forearm pain.

EXAM:
RIGHT WRIST - COMPLETE 3+ VIEW

[wrist pa]
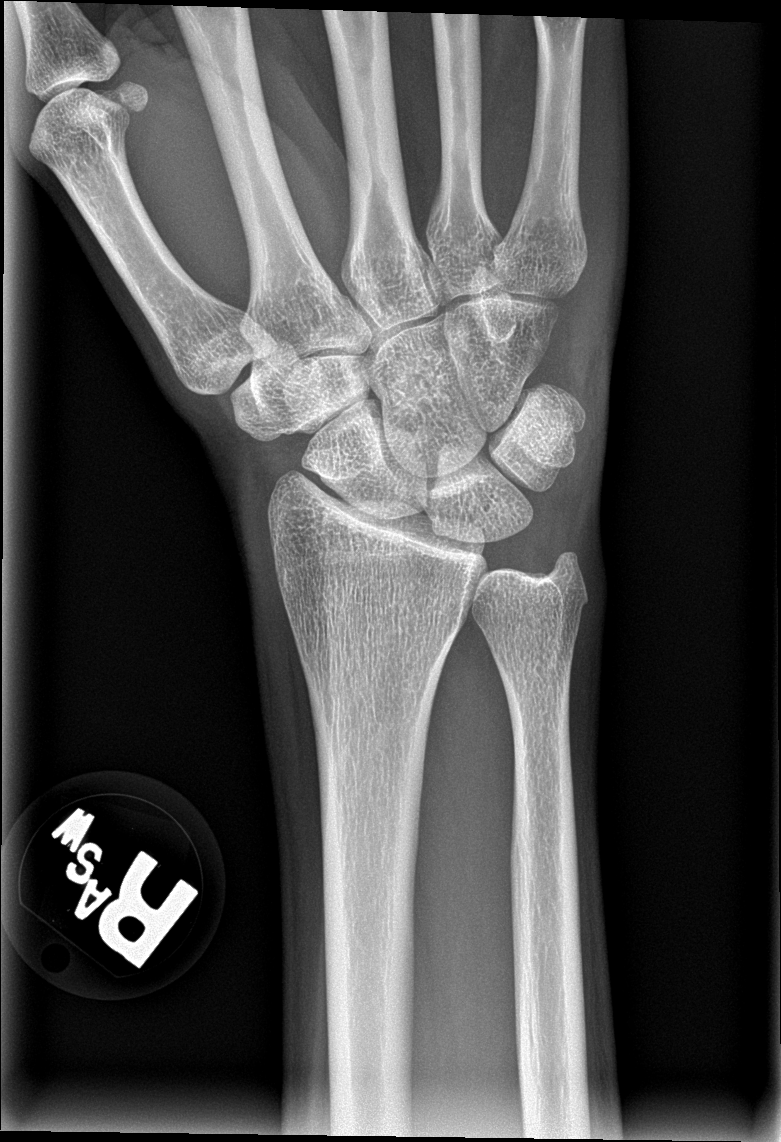

[wrist obl]
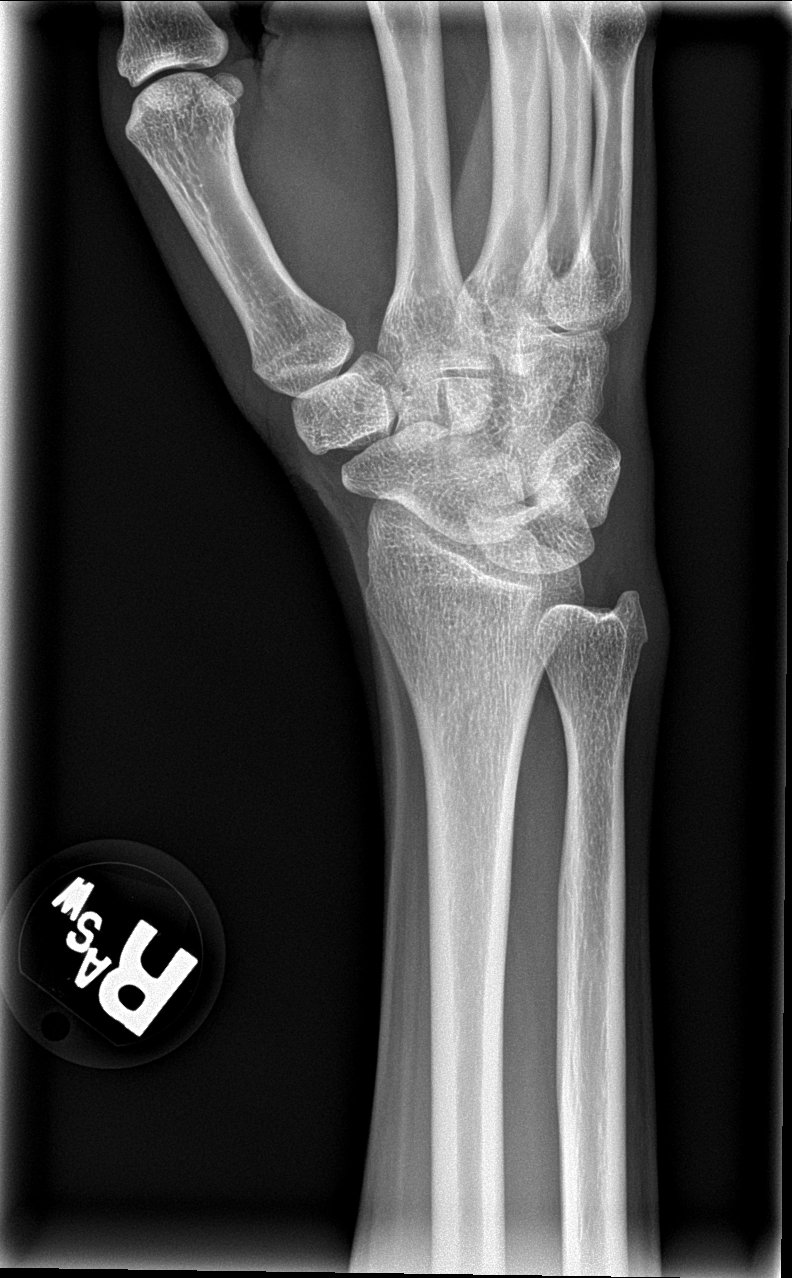

[wrist lat]
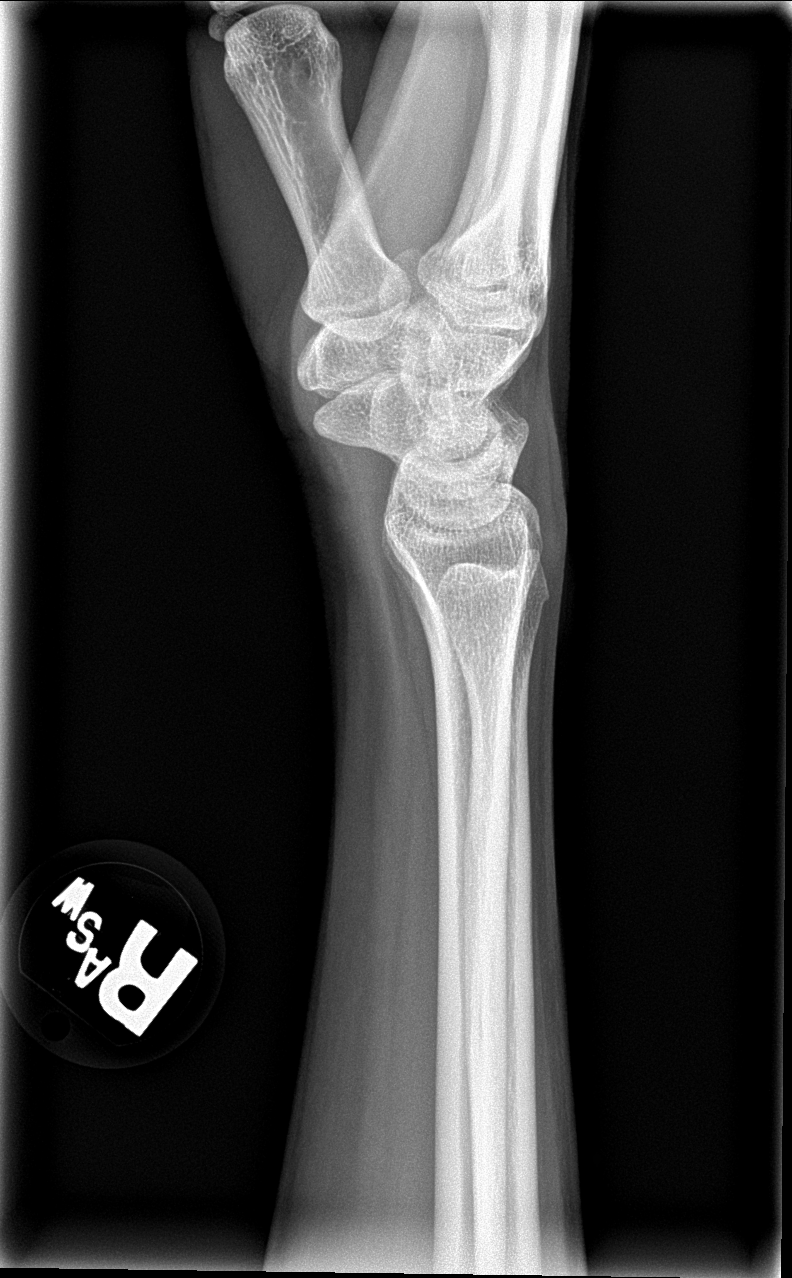

[wrist navicular]
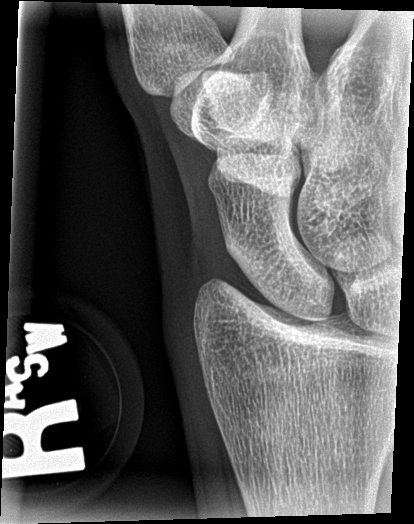

[4 of 4 positions shown; findings below may reference images not displayed]

FINDINGS: Osseous alignment is normal. No fracture line or displaced fracture
fragment seen. Adjacent soft tissues are unremarkable.
IMPRESSION: Negative.

## 2018-08-09 IMAGING — US US RENAL
1 series · 14 of 25 positions shown · non-contrast
Comparison: Body CT 11/06/2016

CLINICAL DATA: Renal colic.

EXAM:
RENAL / URINARY TRACT ULTRASOUND COMPLETE

[Series 1: us renal · 14 of 38 slices shown]
[im 1/38]
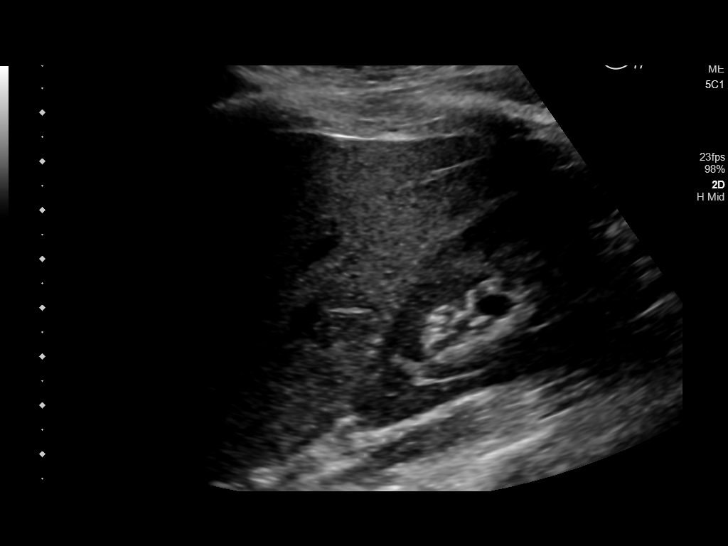
[im 4/38]
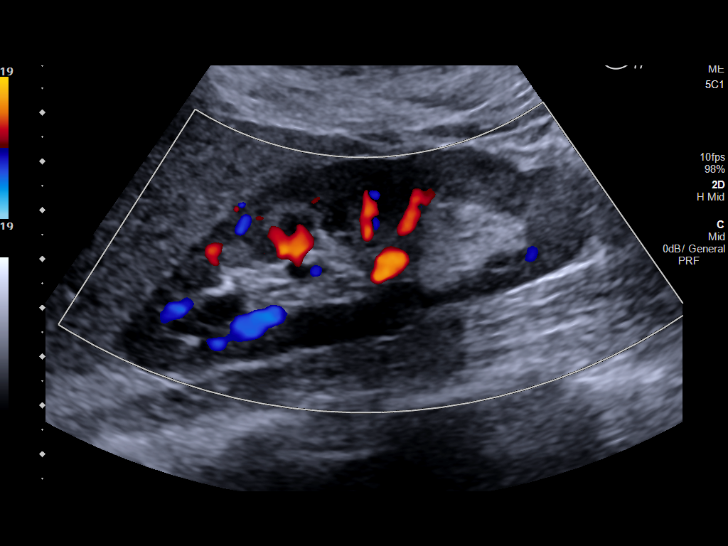
[im 7/38]
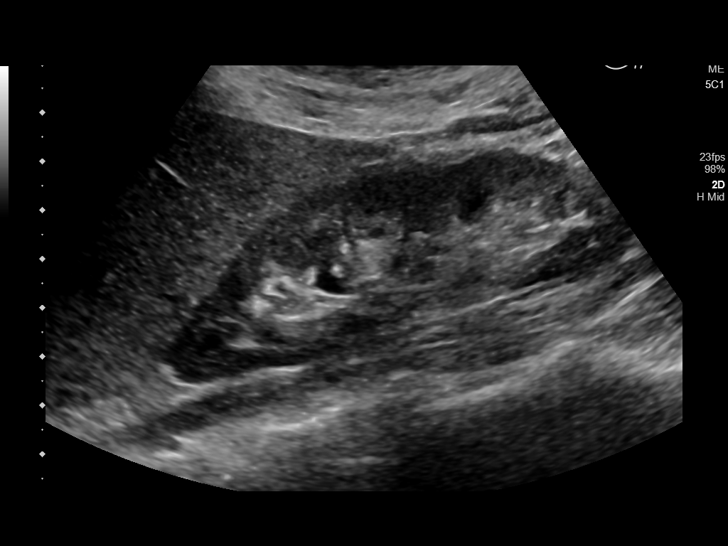
[im 10/38]
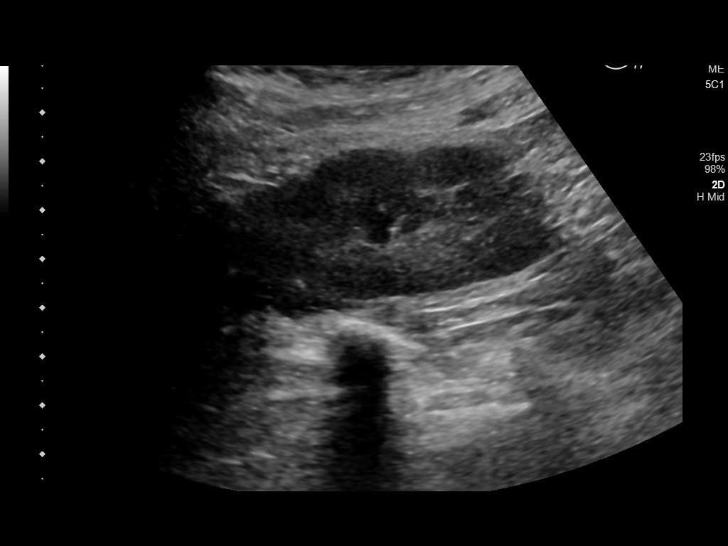
[im 13/38]
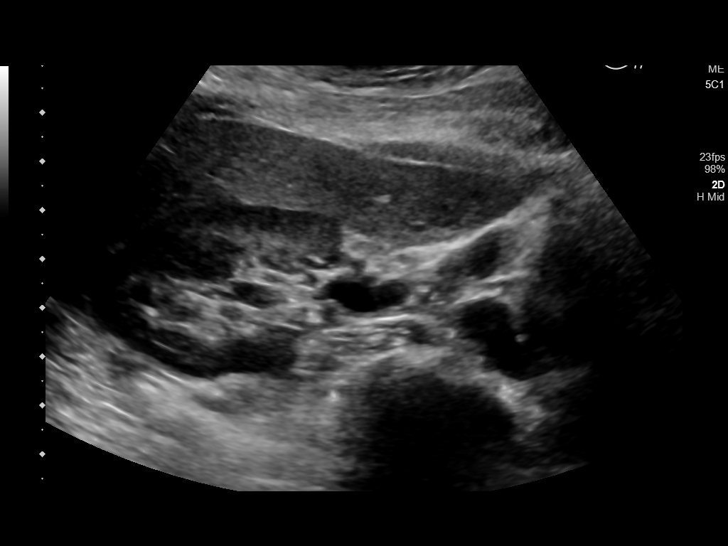
[im 14/38]
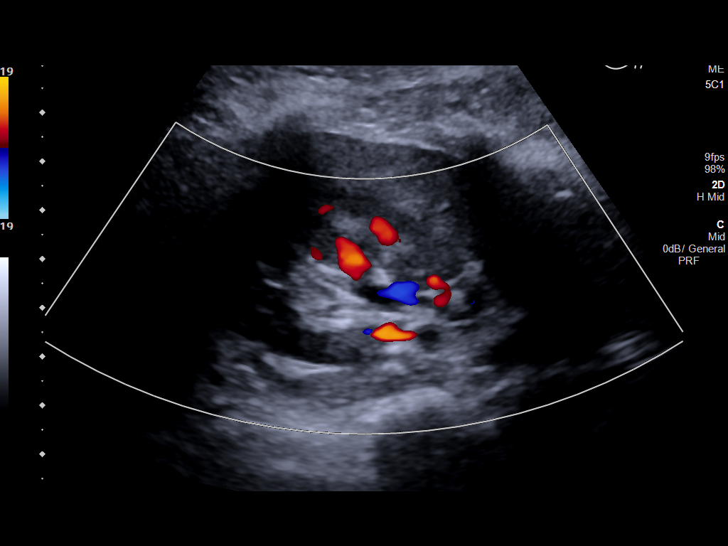
[im 17/38]
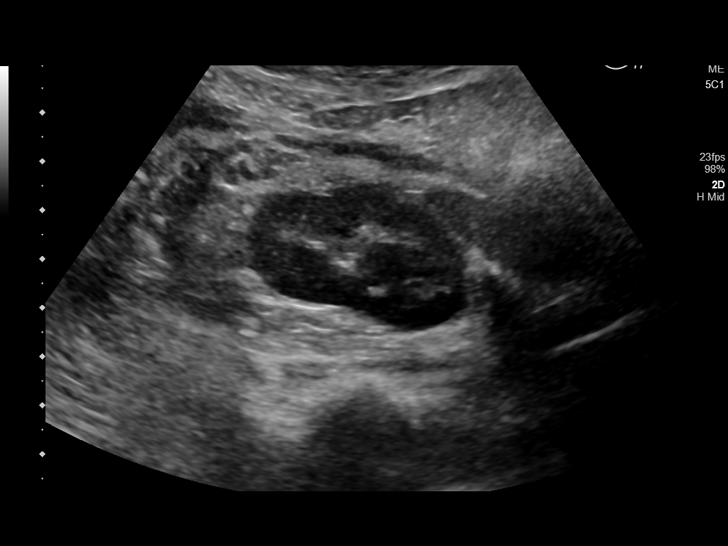
[im 21/38]
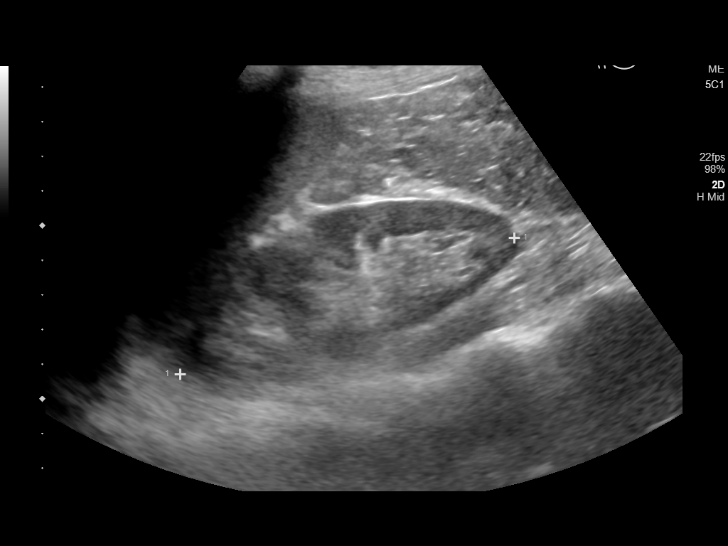
[im 24/38]
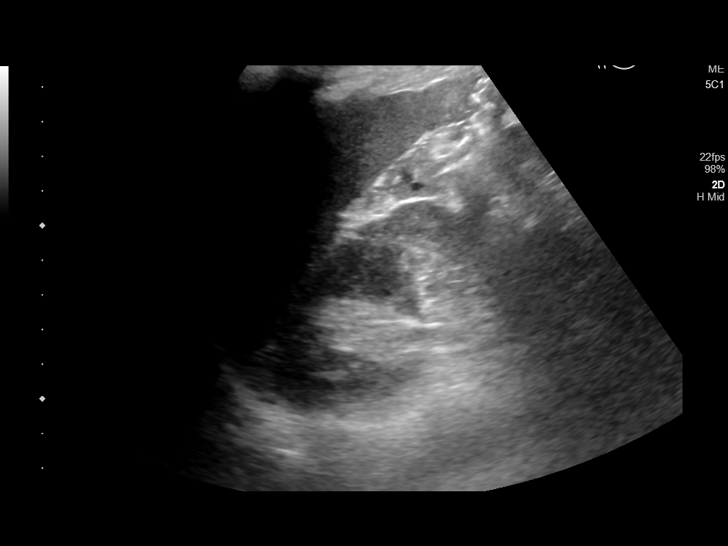
[im 25/38]
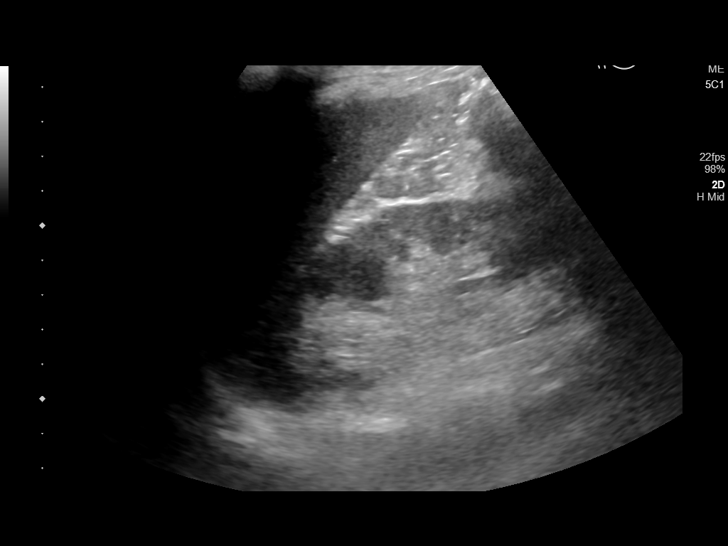
[im 28/38]
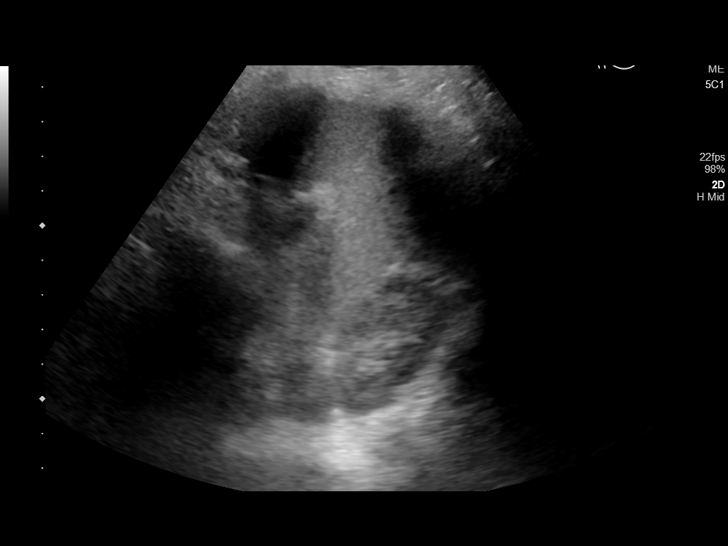
[im 31/38]
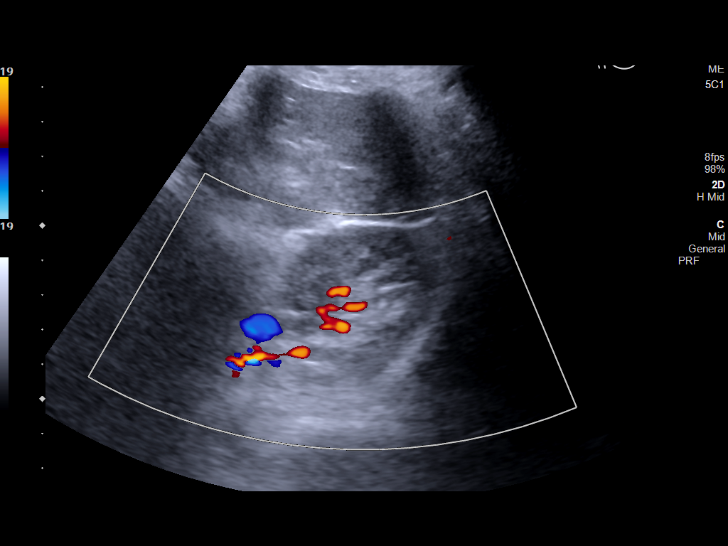
[im 34/38]
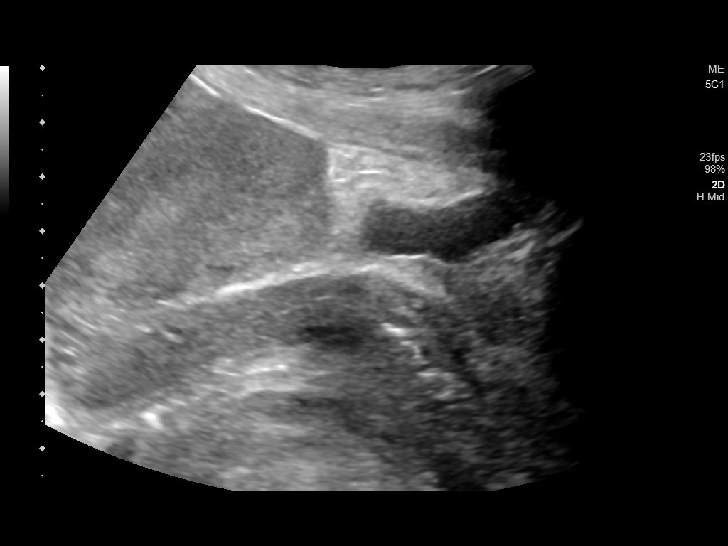
[im 38/38]
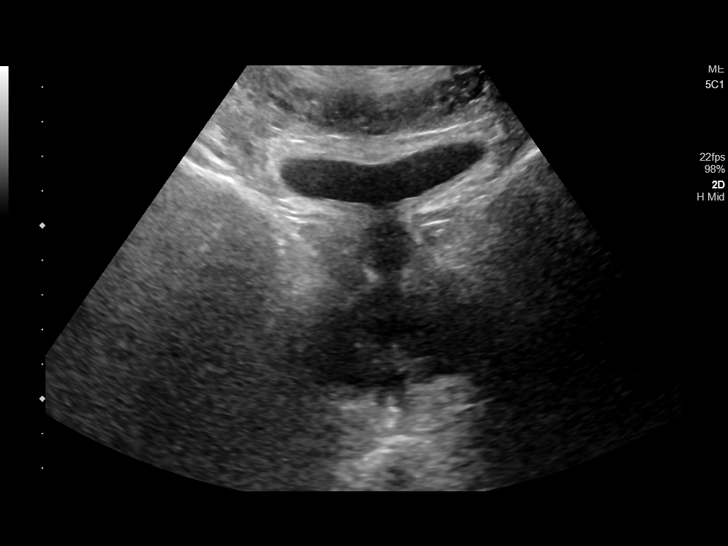

[14 of 25 positions shown; findings below may reference images not displayed]

FINDINGS: Right Kidney:

Length: 10.6 cm. Echogenicity within normal limits. No mass or
hydronephrosis visualized.

Left Kidney:

Length: 10.4 cm. Echogenicity within normal limits. No mass or
hydronephrosis visualized.

Bladder:

Nondistended and therefore not well visualized.
IMPRESSION: Normal appearance of the kidneys.

## 2018-08-09 IMAGING — US US PELVIS COMPLETE
1 series · 14 of 25 positions shown · non-contrast
Comparison: None.

CLINICAL DATA: Right lower quadrant pain

EXAM:
TRANSABDOMINAL ULTRASOUND OF PELVIS
DOPPLER ULTRASOUND OF OVARIES
TECHNIQUE: Transabdominal ultrasound examination of the pelvis was performed
including evaluation of the uterus, ovaries, adnexal regions, and
pelvic cul-de-sac.
Color and duplex Doppler ultrasound was utilized to evaluate blood
flow to the ovaries.

[Series 1: us pelvis complete · 14 of 63 slices shown]
[im 1/63]
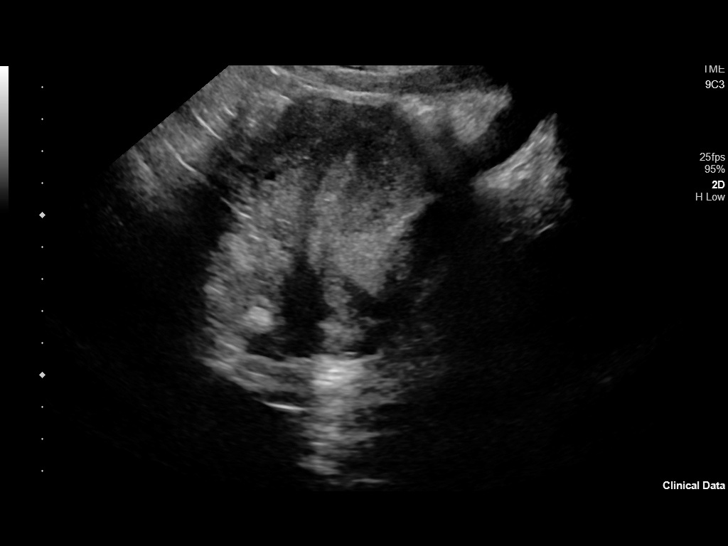
[im 6/63]
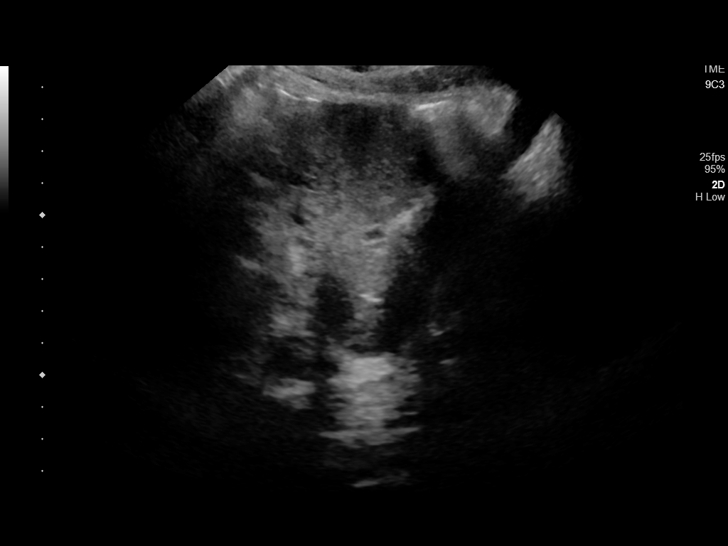
[im 11/63]
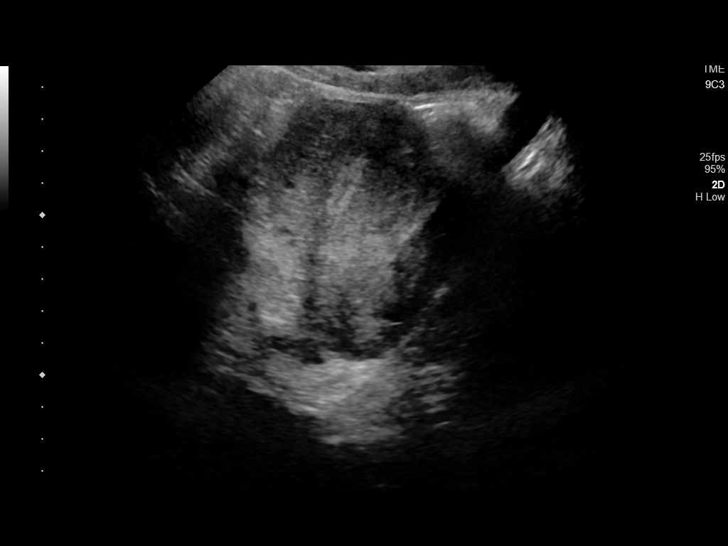
[im 16/63]
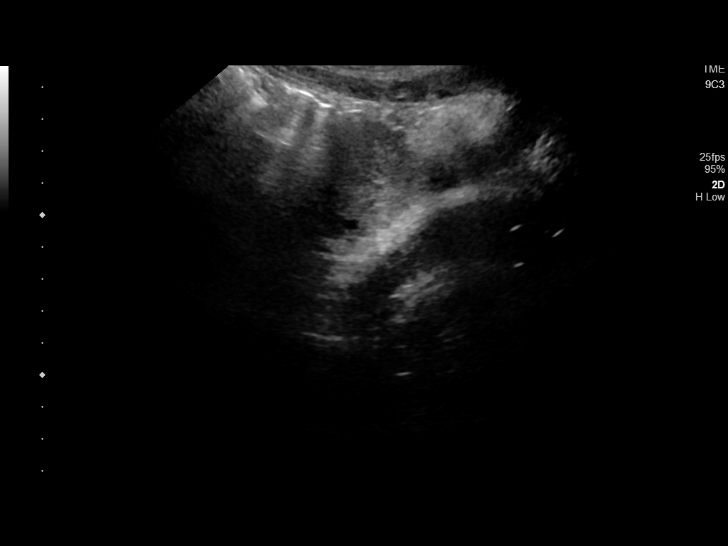
[im 21/63]
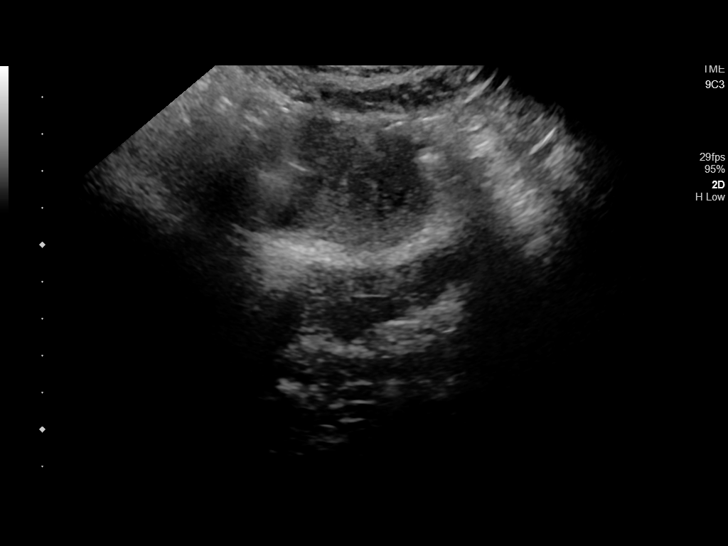
[im 24/63]
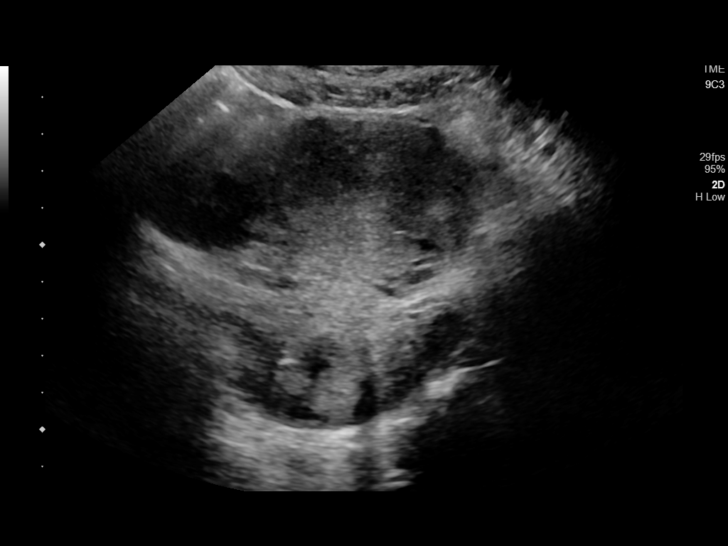
[im 29/63]
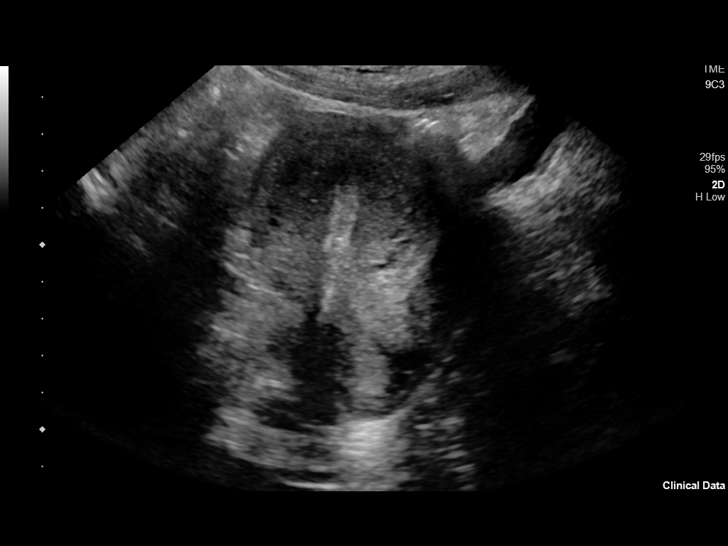
[im 34/63]
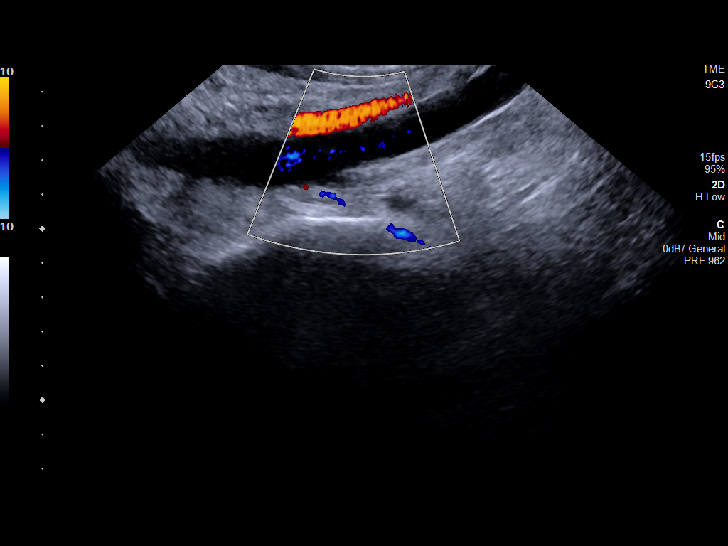
[im 39/63]
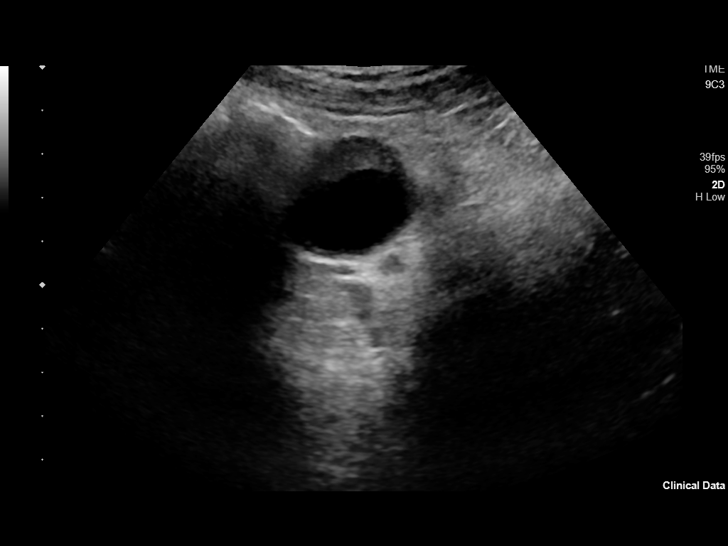
[im 42/63]
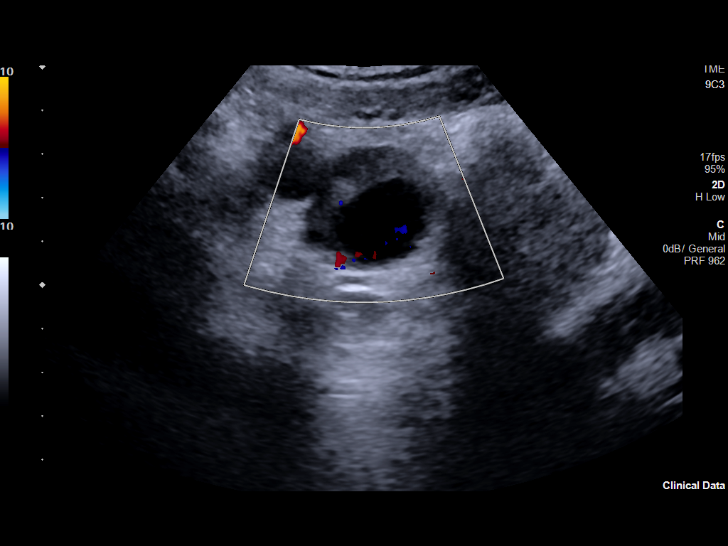
[im 47/63]
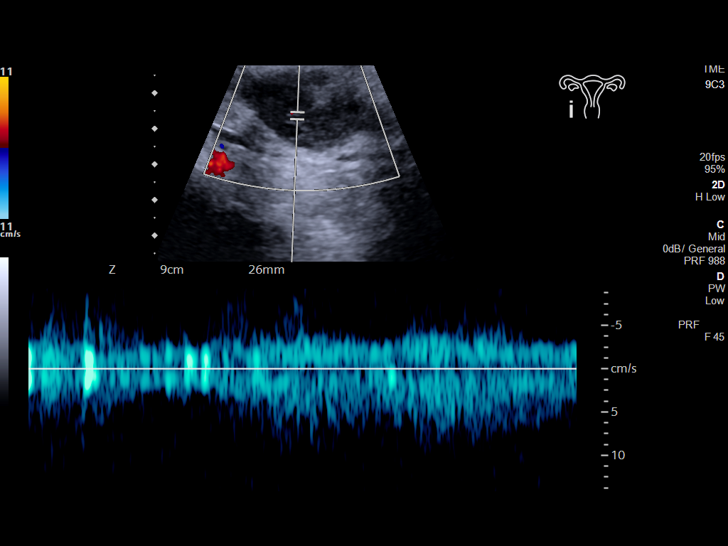
[im 52/63]
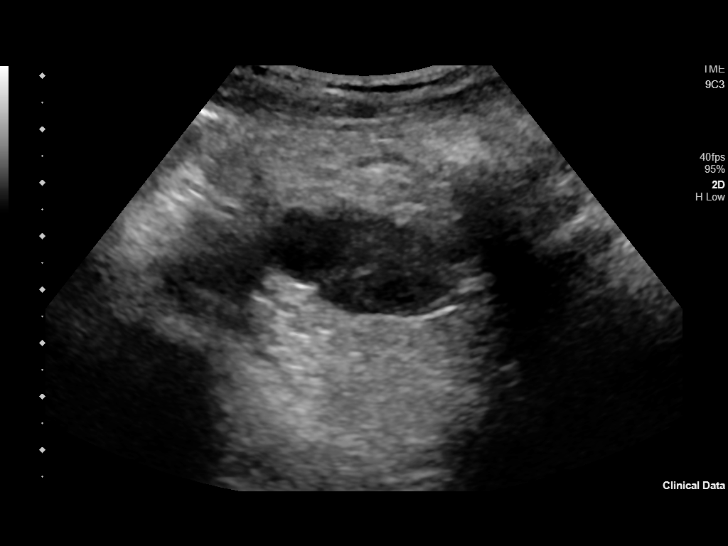
[im 57/63]
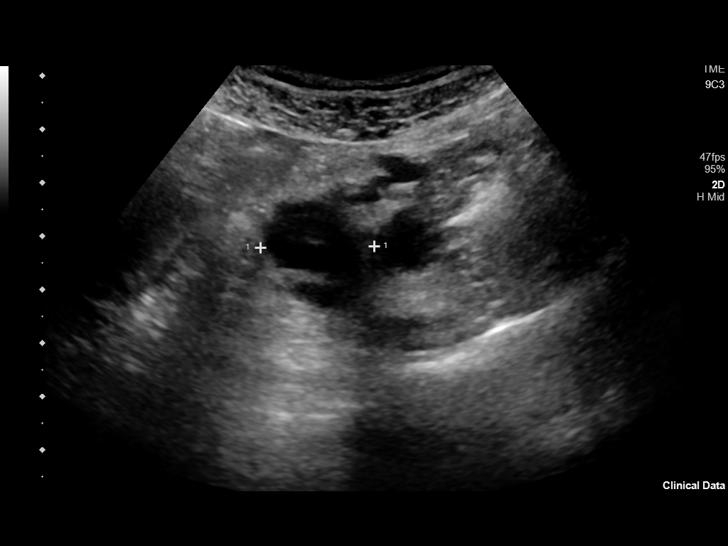
[im 63/63]
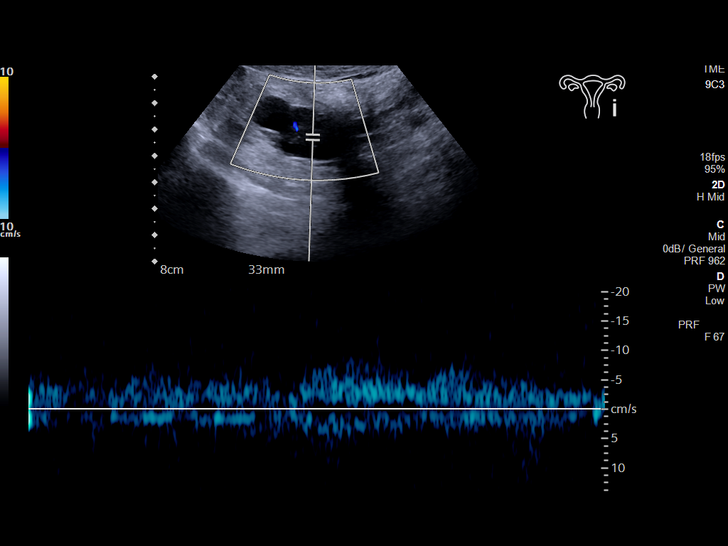

[14 of 25 positions shown; findings below may reference images not displayed]

FINDINGS: Uterus

Measurements: 8 x 5 x 5.2 cm.  No fibroids or other mass visualized.

Endometrium

Thickness: 7.1 mm.  No focal abnormality visualized.

Right ovary

Measurements: 3.3 x 2.7 x 3 cm.  Normal appearance/no adnexal mass.

Left ovary

Measurements: 3.7 x 1.9 x 2.1 cm. Normal appearance/no adnexal mass.

Pulsed Doppler evaluation of both ovaries demonstrates normal
low-resistance arterial and venous waveforms.

Other findings

No abnormal free fluid.
IMPRESSION: 1. No ovarian torsion. Normal pelvic ultrasound.

## 2018-09-22 ENCOUNTER — Telehealth: Payer: Self-pay

## 2018-09-22 NOTE — Telephone Encounter (Signed)
Pt called to get info on the Solosec medication for BV, pt aware I left coupon at front for her.

## 2018-10-14 ENCOUNTER — Other Ambulatory Visit: Payer: Self-pay

## 2018-10-14 ENCOUNTER — Encounter: Payer: Self-pay | Admitting: Emergency Medicine

## 2018-10-14 ENCOUNTER — Ambulatory Visit
Admission: EM | Admit: 2018-10-14 | Discharge: 2018-10-14 | Disposition: A | Discharge status: Home / Self Care | Payer: Medicaid Other | Attending: Family Medicine | Admitting: Family Medicine

## 2018-10-14 DIAGNOSIS — R69 Illness, unspecified: Secondary | ICD-10-CM | POA: Insufficient documentation

## 2018-10-14 DIAGNOSIS — R05 Cough: Secondary | ICD-10-CM

## 2018-10-14 DIAGNOSIS — J111 Influenza due to unidentified influenza virus with other respiratory manifestations: Secondary | ICD-10-CM

## 2018-10-14 LAB — RAPID STREP SCREEN (MED CTR MEBANE ONLY): STREPTOCOCCUS, GROUP A SCREEN (DIRECT): NEGATIVE

## 2018-10-14 LAB — RAPID INFLUENZA A&B ANTIGENS (ARMC ONLY)
INFLUENZA A (ARMC): NEGATIVE
INFLUENZA B (ARMC): NEGATIVE

## 2018-10-14 MED ORDER — OSELTAMIVIR PHOSPHATE 75 MG PO CAPS: 75.0000 mg | ORAL_CAPSULE | Freq: Two times a day (BID) | ORAL | 0 refills | Status: DC

## 2018-10-14 MED ORDER — BENZONATATE 200 MG PO CAPS: ORAL_CAPSULE | ORAL | 0 refills | Status: DC

## 2018-10-14 MED ORDER — GUAIFENESIN-CODEINE 100-10 MG/5ML PO SYRP: 5.0000 mL | ORAL_SOLUTION | Freq: Three times a day (TID) | ORAL | 0 refills | Status: DC | PRN

## 2018-10-14 NOTE — ED Triage Notes (Signed)
Patient c/o cough, congestion, fever, and bodyaches for the past 2 days.

## 2018-10-14 NOTE — Discharge Instructions (Signed)
Drink plenty of fluids.  Rest as much as possible.  Use Tylenol or Motrin for fever chills or body aches.  °

## 2018-10-14 NOTE — ED Provider Notes (Signed)
[facility]    CSN: [id number] Arrival date & time: 10/14/18  1446     History   Chief Complaint Chief Complaint  Patient presents with  . Cough  . Generalized Body Aches  . Fever    HPI Jennifer Richmond is a 33 y.o. female.   HPI       Female presents with cough congestion fever body aches that started very suddenly about 2 days ago.  She did receive her flu shot earlier in the year.  98.5 pulse 80 blood pressure 1/66 respirations 14 O2 sats 100%.  Fever was higher at home.          Past Medical History:  Diagnosis Date  . Abdominal pain   . Anxiety   . Asthma   . Back pain   . Bipolar disorder ([place])   . GERD (gastroesophageal reflux disease)   . Gross hematuria   . Kidney stones   . Panic attacks   . Panic attacks   . PTSD (post-traumatic stress disorder)   . Urinary hesitancy   . UTI (urinary tract infection)     Patient Active Problem List   Diagnosis Date Noted  . Admission for sterilization 06/05/2016  . Flank pain 03/05/2016  . Nephrolithiasis 02/25/2016    Past Surgical History:  Procedure Laterality Date  . CYSTOSCOPY WITH BIOPSY  05/11/2016   Procedure: CYSTOSCOPY WITH BIOPSY;  Surgeon: [name], MD;  Location: [facility];  Service: Urology;;  . DILATION AND CURETTAGE OF UTERUS    . LAPAROSCOPIC OVARIAN CYSTECTOMY    . LAPAROSCOPIC TUBAL LIGATION Bilateral 06/05/2016   Procedure: LAPAROSCOPIC TUBAL LIGATION with bilateral salpingectomy;  Surgeon: Will Bonnet, MD;  Location: [facility];  Service: Gynecology;  Laterality: Bilateral;  . URETEROSCOPY Right 05/11/2016   Procedure: URETEROSCOPY;  Surgeon: [name], MD;  Location: [facility];  Service: Urology;  Laterality: Right;    OB History    Gravida  4   Para  2   Term  2   Preterm      AB  2   Living  2     SAB  1   TAB  1   Ectopic      Multiple  0   Live Births  2            Home Medications    Prior to Admission medications     Medication Sig Start Date End Date Taking? Authorizing Provider  clonazePAM (KLONOPIN) 1 MG tablet Take 1 mg by mouth 2 (two) times daily as needed for anxiety.    Yes [provider]  pantoprazole (PROTONIX) 40 MG tablet Take 40 mg by mouth daily.   Yes [provider]  benzonatate (TESSALON) 200 MG capsule Take one cap TID PRN cough 10/14/18   Crecencio Mc P, PA-C  guaiFENesin-codeine (CHERATUSSIN AC) 100-10 MG/5ML syrup Take 5 mLs by mouth 3 (three) times daily as needed for cough. 10/14/18   Lorin Picket, PA-C  ibuprofen (ADVIL,MOTRIN) 600 MG tablet Take 1 tablet (600 mg total) by mouth every 8 (eight) hours as needed. 11/13/16   Sable Feil, PA-C  oseltamivir (TAMIFLU) 75 MG capsule Take 1 capsule (75 mg total) by mouth every 12 (twelve) hours. 10/14/18   Lorin Picket, PA-C    Family History Family History  Problem Relation Age of Onset  . Diabetes Father   . Kidney nephrosis Father   . Seizures Mother   . Cancer Maternal  Grandfather 50       pancreatic and colon 61s  . Cancer Paternal Grandmother        ovarian  . Kidney cancer Neg Hx   . Prostate cancer Neg Hx   . Bladder Cancer Neg Hx     Social History Social History   Tobacco Use  . Smoking status: Current Every Day Smoker    Packs/day: 0.00    Years: 0.00    Pack years: 0.00    Types: Cigarettes  . Smokeless tobacco: Never Used  . Tobacco comment: 1-2/day  Substance Use Topics  . Alcohol use: No  . Drug use: Yes    Types: Marijuana    Comment: PT DENIES BUT HAD A + UDS FOR MARIJUANA IN 2013     Allergies   Clindamycin/lincomycin and Shrimp [shellfish allergy]   Review of Systems Review of Systems   Physical Exam Triage Vital Signs ED Triage Vitals  Enc Vitals Group     BP 10/14/18 1500 100/66     Pulse Rate 10/14/18 1500 80     Resp 10/14/18 1500 14     Temp 10/14/18 1500 98.5 F (36.9 C)     Temp Source 10/14/18 1500 Oral     SpO2 10/14/18 1500 100 %     Weight  10/14/18 1457 105 lb (47.6 kg)     Height 10/14/18 1457 5\' 5"  (1.651 m)     Head Circumference --      Peak Flow --      Pain Score 10/14/18 1457 3     Pain Loc --      Pain Edu? --      Excl. in [place]? --    No data found.  Updated Vital Signs BP 100/66 (BP Location: Left Arm)   Pulse 80   Temp 98.5 F (36.9 C) (Oral)   Resp 14   Ht 5\' 5"  (1.651 m)   Wt 105 lb (47.6 kg)   LMP 10/07/2018 (Approximate)   SpO2 100%   BMI 17.47 kg/m   Visual Acuity Right Eye Distance:   Left Eye Distance:   Bilateral Distance:    Right Eye Near:   Left Eye Near:    Bilateral Near:     Physical Exam   UC Treatments / Results  Labs (all labs ordered are listed, but only abnormal results are displayed) Labs Reviewed  RAPID STREP SCREEN ([facility] ONLY)  RAPID INFLUENZA A&B ANTIGENS ([facility] ONLY)  CULTURE, GROUP A STREP [facility])    EKG None  Radiology No results found.  Procedures Procedures (including critical care time)  Medications Ordered in UC Medications - No data to display  Initial Impression / Assessment and Plan / UC Course  I have reviewed the triage vital signs and the nursing notes.  Pertinent labs & imaging results that were available during my care of the patient were reviewed by me and considered in my medical decision making (see chart for details).   Patient has flulike illness symptoms.  Time does not have high fever or high pulse rate she has had her flu shot earlier in the year.  We will start her on Tamiflu.  Provide her with Tessalon Perles and Cheratussin for cough at nighttime.  She should return to work on Monday.   Final Clinical Impressions(s) / UC Diagnoses   Final diagnoses:  Influenza-like illness     Discharge Instructions     Drink plenty of fluids.  Rest as much as  possible.  Use Tylenol or Motrin for fever chills or body aches.    ED Prescriptions    Medication Sig Dispense Auth. Provider   oseltamivir (TAMIFLU) 75 MG capsule  Take 1 capsule (75 mg total) by mouth every 12 (twelve) hours. 10 capsule Crecencio Mc P, PA-C   benzonatate (TESSALON) 200 MG capsule Take one cap TID PRN cough 30 capsule Crecencio Mc P, PA-C   guaiFENesin-codeine (CHERATUSSIN AC) 100-10 MG/5ML syrup Take 5 mLs by mouth 3 (three) times daily as needed for cough. 120 mL Lorin Picket, PA-C     Controlled Substance Prescriptions [place] Controlled Substance Registry consulted? Not Applicable   Lorin Picket, PA-C 10/14/18 1559

## 2018-10-17 LAB — CULTURE, GROUP A STREP (THRC)

## 2018-10-20 ENCOUNTER — Ambulatory Visit: Payer: Medicaid Other | Admitting: Maternal Newborn

## 2018-10-21 ENCOUNTER — Ambulatory Visit: Payer: Medicaid Other | Admitting: Obstetrics and Gynecology

## 2018-10-21 ENCOUNTER — Encounter: Payer: Self-pay | Admitting: Obstetrics and Gynecology

## 2018-10-21 VITALS — BP 96/60 | HR 104 | Wt 106.0 lb

## 2018-10-21 DIAGNOSIS — N760 Acute vaginitis: Secondary | ICD-10-CM

## 2018-10-21 DIAGNOSIS — Z113 Encounter for screening for infections with a predominantly sexual mode of transmission: Secondary | ICD-10-CM

## 2018-10-21 NOTE — Progress Notes (Signed)
Obstetrics & Gynecology Office Visit   Chief Complaint:  Chief Complaint  Patient presents with  . vaginal irritation    vaginal discharge w/odor    History of Present Illness: [name] is a 33 y.o. [id number] who LMP was Patient's last menstrual period was 10/07/2018 (approximate)., presents today for a problem visit.   Patient complains of an abnormal vaginal discharge for 1 week. Discharge described as: thin. Vaginal symptoms include none.   Other associated symptoms: odor.Menstrual pattern:   She denies recent antibiotic exposure, denies changes in soaps, detergents coinciding with the onset of her symptoms.  She has not previously self treated or been under treatment by another provider for these symptoms . Also request STI screening  Review of Systems: Review of Systems  Constitutional: Negative.   Skin: Negative.      Past Medical History:  Past Medical History:  Diagnosis Date  . Abdominal pain   . Anxiety   . Asthma   . Back pain   . Bipolar disorder ([place])   . GERD (gastroesophageal reflux disease)   . Gross hematuria   . Kidney stones   . Panic attacks   . Panic attacks   . PTSD (post-traumatic stress disorder)   . Urinary hesitancy   . UTI (urinary tract infection)     Past Surgical History:  Past Surgical History:  Procedure Laterality Date  . CYSTOSCOPY WITH BIOPSY  05/11/2016   Procedure: CYSTOSCOPY WITH BIOPSY;  Surgeon: [name], MD;  Location: [facility] OR[facility];  Service: Urology;;  . DILATION AND CURETTAGE OF UTERUS    . LAPAROSCOPIC OVARIAN CYSTECTOMY    . LAPAROSCOPIC TUBAL LIGATION Bilateral 06/05/2016   Procedure: LAPAROSCOPIC TUBAL LIGATION with bilateral salpingectomy;  Surgeon: Will Bonnet, MD;  Location: [facility] OR[facility];  Service: Gynecology;  Laterality: Bilateral;  . URETEROSCOPY Right 05/11/2016   Procedure: URETEROSCOPY;  Surgeon: [name], MD;  Location: [facility];  Service: Urology;  Laterality: Right;    Gynecologic  History: Patient's last menstrual period was 10/07/2018 (approximate).  Obstetric History: [id number]  Family History:  Family History  Problem Relation Age of Onset  . Diabetes Father   . Kidney nephrosis Father   . Seizures Mother   . Cancer Maternal Grandfather 50       pancreatic and colon 9s  . Cancer Paternal Grandmother        ovarian  . Kidney cancer Neg Hx   . Prostate cancer Neg Hx   . Bladder Cancer Neg Hx     Social History:  Social History   Socioeconomic History  . Marital status: Legally Separated    Spouse name: Not on file  . Number of children: 2  . Years of education: 50  . Highest education level: Not on file  Occupational History  . Occupation: laid off  Social Needs  . Financial resource strain: Not on file  . Food insecurity:    Worry: Not on file    Inability: Not on file  . Transportation needs:    Medical: Not on file    Non-medical: Not on file  Tobacco Use  . Smoking status: Current Every Day Smoker    Packs/day: 0.00    Years: 0.00    Pack years: 0.00    Types: Cigarettes  . Smokeless tobacco: Never Used  . Tobacco comment: 1-2/day  Substance and Sexual Activity  . Alcohol use: Yes    Comment: Occ  . Drug use: Yes  Types: Marijuana    Comment: PT DENIES BUT HAD A + UDS FOR MARIJUANA IN 2013  . Sexual activity: Not Currently    Birth control/protection: Surgical    Comment: tubal  Lifestyle  . Physical activity:    Days per week: Not on file    Minutes per session: Not on file  . Stress: Not on file  Relationships  . Social connections:    Talks on phone: Not on file    Gets together: Not on file    Attends religious service: Not on file    Active member of club or organization: Not on file    Attends meetings of clubs or organizations: Not on file    Relationship status: Not on file  . Intimate partner violence:    Fear of current or ex partner: Not on file    Emotionally abused: Not on file    Physically abused: Not  on file    Forced sexual activity: Not on file  Other Topics Concern  . Not on file  Social History Narrative  . Not on file    Allergies:  Allergies  Allergen Reactions  . Clindamycin/Lincomycin     Possibly CLINDAMYCIN vaginal gel. Patient uncertain of drug  . Shrimp [Shellfish Allergy]     Medications: Prior to Admission medications   Medication Sig Start Date End Date Taking? Authorizing Provider  clonazePAM (KLONOPIN) 1 MG tablet Take 1 mg by mouth 2 (two) times daily as needed for anxiety.    Yes [provider]  guaiFENesin-codeine (CHERATUSSIN AC) 100-10 MG/5ML syrup Take 5 mLs by mouth 3 (three) times daily as needed for cough. 10/14/18  Yes Lorin Picket, PA-C  ibuprofen (ADVIL,MOTRIN) 600 MG tablet Take 1 tablet (600 mg total) by mouth every 8 (eight) hours as needed. 11/13/16  Yes Sable Feil, PA-C  pantoprazole (PROTONIX) 40 MG tablet Take 40 mg by mouth daily.   Yes [provider]    Physical Exam Vitals:  Vitals:   10/21/18 1130  BP: 96/60  Pulse: (!) 104   Patient's last menstrual period was 10/07/2018 (approximate).  General: NAD HEENT: normocephalic, anicteric Pulmonary: No increased work of breathing Genitourinary:  External: Normal external female genitalia.  Normal urethral meatus, normal  Bartholin's and Skene's glands.    Vagina: Normal vaginal mucosa, no evidence of prolapse.     Extremities: no edema, erythema, or tenderness Neurologic: Grossly intact Psychiatric: mood appropriate, affect full  Female chaperone present for pelvic  portions of the physical exam  Wet Prep: PH: 4.5 Clue Cells: Negative Fungal elements: Negative Trichomonas: Negative  Amsel's Criteria Bacterial Vaginosis Clinical diagnosis required presence of 3 of the below 4:  1) Homogenous thin white discharge present 2) Presence of clue cells absent 3) Vaginal pH >4.5 absent 4) Positive whiff test absent   Assessment: 33 y.o. [id number] with  recurrent vaginitis  Plan: Problem List Items Addressed This Visit    None    Visit Diagnoses    Routine screening for STI (sexually transmitted infection)    -  Primary   Relevant Orders   NuSwab Vaginitis Plus (VG+)   HEP, RPR, HIV Panel   Acute vaginitis       Relevant Orders   NuSwab Vaginitis Plus (VG+)      1) Risk factors for bacterial vaginosis and candida infections discussed.  We discussed normal vaginal flora/microbiome.  Any factors that may alter the microbiome increase the risk of these opportunistic infections.  These  include changes in pH, antibiotic exposures, diabetes, wet bathing suits etc.  We discussed that treatment is aimed at eradicating abnormal bacterial overgrowth and or yeast.  There may be some role for vaginal probiotics in restoring normal vaginal flora.   - given equivocal wet mount which does not support BV diagnosis NuSwab sent   Malachy Mood, MD, [place], [facility] 10/21/2018, 11:37 AM

## 2018-10-21 NOTE — Patient Instructions (Addendum)
RePhresh PRO-B

## 2018-10-22 LAB — HEP, RPR, HIV PANEL
HIV SCREEN 4TH GENERATION: NONREACTIVE
Hepatitis B Surface Ag: NEGATIVE
RPR: NONREACTIVE

## 2018-10-25 ENCOUNTER — Telehealth: Payer: Self-pay

## 2018-10-25 LAB — NUSWAB VAGINITIS PLUS (VG+)
Candida albicans, NAA: NEGATIVE
Candida glabrata, NAA: NEGATIVE
Chlamydia trachomatis, NAA: NEGATIVE
Neisseria gonorrhoeae, NAA: NEGATIVE
TRICH VAG BY NAA: NEGATIVE

## 2018-10-25 NOTE — Telephone Encounter (Signed)
Rephresh Pro-B is the brand name

## 2018-10-25 NOTE — Telephone Encounter (Signed)
Pt calling c/o AMS adv her to get vaginal suppository Probiotic.  Pt is unable to find them anywhere - not even [employer] or [facility] on line.  Doesn't know what to do.  Please call.  [phone]

## 2018-10-25 NOTE — Telephone Encounter (Signed)
advise

## 2018-10-26 NOTE — Telephone Encounter (Signed)
So if she goggles vaginal pro-biotic there are several different brands with the main thing to look at being cost and that is contains lactobacillus

## 2018-10-26 NOTE — Telephone Encounter (Signed)
Pt aware.

## 2018-10-26 NOTE — Telephone Encounter (Signed)
Pt states she can only find the oral pills. She went to [facility] and they even said they used to have to suppositories but no longer. She says if you can find them to let her know. In the meantime she is going to get the oral dose.

## 2018-11-01 NOTE — Telephone Encounter (Signed)
Pt calling to see if test results were positive for BV.  She has something at the pharm but doesn't know if for the BV or not.  Still can't find vaginal probiotic.  [phone]

## 2018-11-01 NOTE — Telephone Encounter (Signed)
Looks like labs were all negative. I also, do not see where a Rx was called in to her pharmacy. Please advise her on results.

## 2018-11-02 NOTE — Telephone Encounter (Signed)
Pt aware.

## 2018-11-02 NOTE — Telephone Encounter (Signed)
Patient is calling for results please advise

## 2018-11-02 NOTE — Telephone Encounter (Signed)
Results were negative so nothing was called in by myself

## 2018-11-03 ENCOUNTER — Ambulatory Visit: Payer: Medicaid Other | Admitting: Obstetrics and Gynecology

## 2020-05-03 ENCOUNTER — Ambulatory Visit: Admit: 2020-05-03 | Disposition: A | Discharge status: Home / Self Care | Payer: Self-pay

## 2020-05-03 ENCOUNTER — Other Ambulatory Visit: Payer: Self-pay

## 2020-05-03 ENCOUNTER — Ambulatory Visit
Admission: EM | Admit: 2020-05-03 | Discharge: 2020-05-03 | Disposition: A | Discharge status: Home / Self Care | Payer: Medicaid Other | Attending: Emergency Medicine | Admitting: Emergency Medicine

## 2020-05-03 DIAGNOSIS — J069 Acute upper respiratory infection, unspecified: Secondary | ICD-10-CM | POA: Diagnosis not present

## 2020-05-03 NOTE — ED Provider Notes (Signed)
Roderic Palau    CSN: [id number] Arrival date & time: 05/03/20  1720      History   Chief Complaint Chief Complaint  Patient presents with  . Headache  . Fatigue  . Nasal Drainage    HPI CODI FOLKERTS is a 34 y.o. female.   Patient presents with 3-day history of headache, fatigue, nasal drainage.  She denies fever, chills, sore throat, cough, shortness of breath, vomiting, diarrhea, or other symptoms.  Patient reports exposure to COVID at work.  Patient has not had the COVID vaccines.  Her medical history includes asthma, kidney stones, GERD, bipolar, anxiety, panic attacks, PTSD.  The history is provided by the patient.    Past Medical History:  Diagnosis Date  . Abdominal pain   . Anxiety   . Asthma   . Back pain   . Bipolar disorder ([place])   . GERD (gastroesophageal reflux disease)   . Gross hematuria   . Kidney stones   . Panic attacks   . Panic attacks   . PTSD (post-traumatic stress disorder)   . Urinary hesitancy   . UTI (urinary tract infection)     Patient Active Problem List   Diagnosis Date Noted  . Admission for sterilization 06/05/2016  . Flank pain 03/05/2016  . Nephrolithiasis 02/25/2016    Past Surgical History:  Procedure Laterality Date  . CYSTOSCOPY WITH BIOPSY  05/11/2016   Procedure: CYSTOSCOPY WITH BIOPSY;  Surgeon: [name], MD;  Location: [facility];  Service: Urology;;  . DILATION AND CURETTAGE OF UTERUS    . LAPAROSCOPIC OVARIAN CYSTECTOMY    . LAPAROSCOPIC TUBAL LIGATION Bilateral 06/05/2016   Procedure: LAPAROSCOPIC TUBAL LIGATION with bilateral salpingectomy;  Surgeon: Will Bonnet, MD;  Location: [facility];  Service: Gynecology;  Laterality: Bilateral;  . URETEROSCOPY Right 05/11/2016   Procedure: URETEROSCOPY;  Surgeon: [name], MD;  Location: [facility];  Service: Urology;  Laterality: Right;    OB History    Gravida  4   Para  2   Term  2   Preterm      AB  2   Living  2     SAB  1   TAB    1   Ectopic      Multiple  0   Live Births  2            Home Medications    Prior to Admission medications   Medication Sig Start Date End Date Taking? Authorizing Provider  clonazePAM (KLONOPIN) 1 MG tablet Take 1 mg by mouth 2 (two) times daily as needed for anxiety.     [provider]  guaiFENesin-codeine (CHERATUSSIN AC) 100-10 MG/5ML syrup Take 5 mLs by mouth 3 (three) times daily as needed for cough. 10/14/18   Lorin Picket, PA-C  ibuprofen (ADVIL,MOTRIN) 600 MG tablet Take 1 tablet (600 mg total) by mouth every 8 (eight) hours as needed. 11/13/16   Sable Feil, PA-C  pantoprazole (PROTONIX) 40 MG tablet Take 40 mg by mouth daily.    [provider]    Family History Family History  Problem Relation Age of Onset  . Diabetes Father   . Kidney nephrosis Father   . Seizures Mother   . Cancer Maternal Grandfather 50       pancreatic and colon 86s  . Cancer Paternal Grandmother        ovarian  . Kidney cancer Neg Hx   . Prostate cancer Neg Hx   .  Bladder Cancer Neg Hx     Social History Social History   Tobacco Use  . Smoking status: Current Every Day Smoker    Packs/day: 0.00    Years: 0.00    Pack years: 0.00    Types: Cigarettes  . Smokeless tobacco: Never Used  . Tobacco comment: 1-3 cigarettes per day  Vaping Use  . Vaping Use: Never used  Substance Use Topics  . Alcohol use: Yes    Comment: Occ  . Drug use: Yes    Types: Marijuana    Comment: PT DENIES BUT HAD A + UDS FOR MARIJUANA IN 2013     Allergies   Clindamycin/lincomycin and Shrimp [shellfish allergy]   Review of Systems Review of Systems  Constitutional: Positive for fatigue. Negative for chills and fever.  HENT: Positive for postnasal drip. Negative for ear pain and sore throat.   Eyes: Negative for pain and visual disturbance.  Respiratory: Negative for cough and shortness of breath.   Cardiovascular: Negative for chest pain and palpitations.   Gastrointestinal: Negative for abdominal pain, diarrhea and vomiting.  Genitourinary: Negative for dysuria and hematuria.  Musculoskeletal: Negative for arthralgias and back pain.  Skin: Negative for color change and rash.  Neurological: Positive for headaches. Negative for seizures and syncope.  All other systems reviewed and are negative.    Physical Exam Triage Vital Signs ED Triage Vitals  Enc Vitals Group     BP      Pulse      Resp      Temp      Temp src      SpO2      Weight      Height      Head Circumference      Peak Flow      Pain Score      Pain Loc      Pain Edu?      Excl. in [place]?    No data found.  Updated Vital Signs BP 121/81   Pulse 67   Temp 98.4 F (36.9 C)   Resp 14   LMP 04/19/2020 (Within Days)   SpO2 96%   Visual Acuity Right Eye Distance:   Left Eye Distance:   Bilateral Distance:    Right Eye Near:   Left Eye Near:    Bilateral Near:     Physical Exam Vitals and nursing note reviewed.  Constitutional:      General: She is not in acute distress.    Appearance: She is well-developed. She is not ill-appearing.  HENT:     Head: Normocephalic and atraumatic.     Right Ear: Tympanic membrane normal.     Left Ear: Tympanic membrane normal.     Nose: Nose normal.     Mouth/Throat:     Mouth: Mucous membranes are moist.     Pharynx: Oropharynx is clear.  Eyes:     Conjunctiva/sclera: Conjunctivae normal.  Cardiovascular:     Rate and Rhythm: Normal rate and regular rhythm.     Heart sounds: No murmur heard.   Pulmonary:     Effort: Pulmonary effort is normal. No respiratory distress.     Breath sounds: Normal breath sounds.  Abdominal:     Palpations: Abdomen is soft.     Tenderness: There is no abdominal tenderness. There is no guarding or rebound.  Musculoskeletal:     Cervical back: Neck supple.  Skin:    General: Skin is warm and dry.  Findings: No rash.  Neurological:     General: No focal deficit present.      Mental Status: She is alert and oriented to person, place, and time.     Gait: Gait normal.  Psychiatric:        Mood and Affect: Mood normal.        Behavior: Behavior normal.      UC Treatments / Results  Labs (all labs ordered are listed, but only abnormal results are displayed) Labs Reviewed  NOVEL CORONAVIRUS, NAA    EKG   Radiology No results found.  Procedures Procedures (including critical care time)  Medications Ordered in UC Medications - No data to display  Initial Impression / Assessment and Plan / UC Course  I have reviewed the triage vital signs and the nursing notes.  Pertinent labs & imaging results that were available during my care of the patient were reviewed by me and considered in my medical decision making (see chart for details).   URI.  PCR COVID pending.  Instructed patient to self quarantine until the test result is back.  Discussed symptomatic treatment including Tylenol, rest, hydration.  Instructed patient to go to the ED if she has acute worsening symptoms.  Patient agrees to plan of care.    Final Clinical Impressions(s) / UC Diagnoses   Final diagnoses:  Upper respiratory tract infection, unspecified type     Discharge Instructions     Your COVID test is pending.  You should self quarantine until the test result is back.    Take Tylenol as needed for fever or discomfort.  Rest and keep yourself hydrated.    Go to the emergency department if you develop acute worsening symptoms.        ED Prescriptions    None     PDMP not reviewed this encounter.   Sharion Balloon, NP 05/03/20 1754

## 2020-05-03 NOTE — Discharge Instructions (Signed)
Your COVID test is pending.  You should self quarantine until the test result is back.    Take Tylenol as needed for fever or discomfort.  Rest and keep yourself hydrated.    Go to the emergency department if you develop acute worsening symptoms.     

## 2020-05-03 NOTE — ED Triage Notes (Signed)
C/o headache, nasal drainage, and fatigue x3 days. Agreeable to covid testing.

## 2020-05-05 ENCOUNTER — Other Ambulatory Visit: Payer: Self-pay

## 2020-05-05 DIAGNOSIS — R1110 Vomiting, unspecified: Secondary | ICD-10-CM | POA: Diagnosis present

## 2020-05-05 DIAGNOSIS — Z5321 Procedure and treatment not carried out due to patient leaving prior to being seen by health care provider: Secondary | ICD-10-CM | POA: Insufficient documentation

## 2020-05-05 DIAGNOSIS — U071 COVID-19: Secondary | ICD-10-CM | POA: Insufficient documentation

## 2020-05-05 LAB — COMPREHENSIVE METABOLIC PANEL
ALT: 18 U/L (ref 0–44)
AST: 26 U/L (ref 15–41)
Albumin: 4.2 g/dL (ref 3.5–5.0)
Alkaline Phosphatase: 51 U/L (ref 38–126)
Anion gap: 10 (ref 5–15)
BUN: 15 mg/dL (ref 6–20)
CO2: 21 mmol/L — ABNORMAL LOW (ref 22–32)
Calcium: 8.9 mg/dL (ref 8.9–10.3)
Chloride: 109 mmol/L (ref 98–111)
Creatinine, Ser: 0.84 mg/dL (ref 0.44–1.00)
GFR calc Af Amer: >60 mL/min (ref >60)
GFR calc non Af Amer: >60 mL/min (ref >60)
Glucose, Bld: 94 mg/dL (ref 70–99)
Potassium: 3.5 mmol/L (ref 3.5–5.1)
Sodium: 140 mmol/L (ref 135–145)
Total Bilirubin: 0.5 mg/dL (ref 0.3–1.2)
Total Protein: 7.2 g/dL (ref 6.5–8.1)

## 2020-05-05 LAB — CBC
HCT: 42.1 % (ref 36.0–46.0)
Hemoglobin: 15 g/dL (ref 12.0–15.0)
MCH: 30.6 pg (ref 26.0–34.0)
MCHC: 35.6 g/dL (ref 30.0–36.0)
MCV: 85.9 fL (ref 80.0–100.0)
Platelets: 164 K/uL (ref 150–400)
RBC: 4.9 MIL/uL (ref 3.87–5.11)
RDW: 13 % (ref 11.5–15.5)
WBC: 6.9 K/uL (ref 4.0–10.5)
nRBC: 0 % (ref 0.0–0.2)

## 2020-05-05 MED ORDER — ONDANSETRON HCL 4 MG/2ML IJ SOLN: 4.0000 mg | Freq: Once | INTRAMUSCULAR | Status: AC | PRN

## 2020-05-05 MED ORDER — ONDANSETRON HCL 4 MG/2ML IJ SOLN
INTRAMUSCULAR | Status: AC
  Administered 2020-05-05: 4 mg via INTRAVENOUS
  Filled 2020-05-05: qty 2

## 2020-05-05 NOTE — ED Triage Notes (Signed)
Pt states she has covid and is vomiting and does not feel well. Pt states 'I can't take it anymore". resps unlabored.

## 2020-05-06 ENCOUNTER — Emergency Department
Admission: EM | Admit: 2020-05-06 | Discharge: 2020-05-06 | Disposition: A | Discharge status: Home / Self Care | Payer: Medicaid Other | Attending: Emergency Medicine | Admitting: Emergency Medicine

## 2020-05-06 ENCOUNTER — Emergency Department: Payer: Medicaid Other

## 2020-05-06 NOTE — ED Notes (Signed)
Patient informed screener that she was going home. Patient encouraged to stay. Patient left.

## 2020-05-07 LAB — NOVEL CORONAVIRUS, NAA: SARS-CoV-2, NAA: DETECTED — AB

## 2020-05-08 ENCOUNTER — Telehealth: Payer: Self-pay | Admitting: Infectious Diseases

## 2020-05-08 NOTE — Telephone Encounter (Signed)
Called to Discuss with patient about Covid symptoms and the use of the monoclonal antibody infusion for those with mild to moderate Covid symptoms and at a high risk of hospitalization.     Pt appears to qualify for this infusion due to co-morbid conditions and/or a member of an at-risk group in accordance with the FDA Emergency Use Authorization.    Unable to reach pt  Text message and MyChart sent  Qualifiers include high SVI risk score Sx started either 9/07 or 9/08 per chart records.   Eligible through Friday 9/17 for treatment - callback [phone].

## 2021-06-02 ENCOUNTER — Encounter: Payer: Self-pay | Admitting: Emergency Medicine

## 2021-06-02 ENCOUNTER — Ambulatory Visit
Admission: EM | Admit: 2021-06-02 | Discharge: 2021-06-02 | Disposition: A | Discharge status: Home / Self Care | Payer: Medicaid Other | Attending: Emergency Medicine | Admitting: Emergency Medicine

## 2021-06-02 ENCOUNTER — Other Ambulatory Visit: Payer: Self-pay

## 2021-06-02 DIAGNOSIS — Z20818 Contact with and (suspected) exposure to other bacterial communicable diseases: Secondary | ICD-10-CM

## 2021-06-02 DIAGNOSIS — J029 Acute pharyngitis, unspecified: Secondary | ICD-10-CM | POA: Diagnosis not present

## 2021-06-02 LAB — POCT RAPID STREP A (OFFICE): Rapid Strep A Screen: NEGATIVE

## 2021-06-02 MED ORDER — FLUCONAZOLE 150 MG PO TABS: 150.0000 mg | ORAL_TABLET | Freq: Every day | ORAL | 0 refills | Status: AC

## 2021-06-02 MED ORDER — AMOXICILLIN 500 MG PO TABS: 500.0000 mg | ORAL_TABLET | Freq: Two times a day (BID) | ORAL | 0 refills | Status: AC

## 2021-06-02 NOTE — Discharge Instructions (Addendum)
I recommend the following for at-home care:   Push fluids (water, Gatorade, Pedialyte).   Tylenol or ibuprofen for pain or fever.  Warm salt water gargles for throat pain as needed.  Throw away toothbrush after 2 days of treatment.   Be sure to complete all of your antibiotic, even if you are feeling better sooner.  Be advised that you will be infectious for 24 hours after starting antibiotics.  Follow up with PCP if not improving in 2 - 3 days.   Return for significant worsening of pain, persistent fever, drooling, or neck stiffness.    Take your first Diflucan at initial onset of yeast infection symptoms (vaginal itching, thick white clumpy vaginal discharge) then your second Diflucan 3 to 5 days later if symptoms do not improve.

## 2021-06-02 NOTE — ED Provider Notes (Signed)
CHIEF COMPLAINT:   Chief Complaint  Patient presents with   Sore Throat     SUBJECTIVE/HPI:   Sore Throat  A very pleasant 35 y.o.Female presents today with sore throat for the last 2 days.  Patient reports that her son has strep. Patient does not report any shortness of breath, chest pain, palpitations, visual changes, weakness, tingling, headache, nausea, vomiting, diarrhea, fever, chills.   has a past medical history of Abdominal pain, Anxiety, Asthma, Back pain, Bipolar disorder ([place]), GERD (gastroesophageal reflux disease), Gross hematuria, Kidney stones, Panic attacks, Panic attacks, PTSD (post-traumatic stress disorder), Urinary hesitancy, and UTI (urinary tract infection).  ROS:  Review of Systems See Subjective/HPI Medications, Allergies and Problem List personally reviewed in Epic today OBJECTIVE:   Vitals:   06/02/21 1833  BP: 107/72  Pulse: 77  Resp: 16  Temp: 98.5 F (36.9 C)  SpO2: 98%    Physical Exam   General: Appears well-developed and well-nourished. No acute distress.  HEENT Head: Normocephalic and atraumatic.   Ears: Hearing grossly intact, no drainage or visible deformity.  Nose: No nasal deviation.   Mouth/Throat: No stridor or tracheal deviation.  + erythematous posterior pharynx noted with clear drainage present.  No white patchy exudate noted. Eyes: Conjunctivae and EOM are normal. No eye drainage or scleral icterus bilaterally.  Neck: Normal range of motion, neck is supple.  Cardiovascular: Normal rate Pulm/Chest: No respiratory distress Neurological: Alert and oriented to person, place, and time.  Skin: Skin is warm and dry.  No rashes, lesions, abrasions or bruising noted to skin.   Psychiatric: Normal mood, affect, behavior, and thought content.   Vital signs and nursing note reviewed.   Patient stable and cooperative with examination. PROCEDURES:    LABS/X-RAYS/EKG/MEDS:   Results for orders placed or performed during the hospital  encounter of 06/02/21  POCT rapid strep A  Result Value Ref Range   Rapid Strep A Screen Negative Negative    MEDICAL DECISION MAKING:   Patient presents with sore throat for the last 2 days.  Patient reports that her son has strep. Patient does not report any shortness of breath, chest pain, palpitations, visual changes, weakness, tingling, headache, nausea, vomiting, diarrhea, fever, chills.  Strep negative.  Given her exposure to her son who has a recent history of strep throat, will treat for strep exposure with amoxicillin.  Advised about home treatment and care to include throwing away her toothbrush after 2 days of treatment, warm salt gargles, Tylenol versus ibuprofen for discomfort.  Return with any significant worsening of pain, persistent fever, drooling or neck stiffness.  Also sent in Diflucan to the patient's preferred pharmacy if she begins to have symptoms of a yeast infection as she does report that sometimes with antibiotics she will have this to occur.  Advised to only take this medication at initial signs of yeast infection symptoms such as vaginal itching, thick white clumpy vaginal discharge.  Patient verbalized understanding and agreed with treatment plan.  Patient stable upon discharge. ASSESSMENT/PLAN:  1. Strep throat exposure - amoxicillin (AMOXIL) 500 MG tablet; Take 1 tablet (500 mg total) by mouth 2 (two) times daily for 10 days.  Dispense: 20 tablet; Refill: 0 Instructions about new medications and side effects provided.  Plan:   Discharge Instructions      I recommend the following for at-home care:   Push fluids (water, Gatorade, Pedialyte).   Tylenol or ibuprofen for pain or fever.  Warm salt water gargles for throat pain as  needed.  Throw away toothbrush after 2 days of treatment.   Be sure to complete all of your antibiotic, even if you are feeling better sooner.  Be advised that you will be infectious for 24 hours after starting antibiotics.  Follow  up with PCP if not improving in 2 - 3 days.   Return for significant worsening of pain, persistent fever, drooling, or neck stiffness.    Take your first Diflucan at initial onset of yeast infection symptoms (vaginal itching, thick white clumpy vaginal discharge) then your second Diflucan 3 to 5 days later if symptoms do not improve.          Serafina Royals, [place] 06/02/21 [id number]

## 2021-06-02 NOTE — ED Triage Notes (Signed)
Pt here with sore throat x 2 days. Pt son has strep throat.

## 2021-09-17 ENCOUNTER — Other Ambulatory Visit: Payer: Self-pay | Admitting: Family Medicine

## 2021-09-17 DIAGNOSIS — N6311 Unspecified lump in the right breast, upper outer quadrant: Secondary | ICD-10-CM

## 2021-09-22 ENCOUNTER — Inpatient Hospital Stay
Admission: RE | Admit: 2021-09-22 | Discharge: 2021-09-22 | Disposition: A | Discharge status: Home / Self Care | Payer: Self-pay | Source: Ambulatory Visit | Attending: *Deleted | Admitting: *Deleted

## 2021-09-22 ENCOUNTER — Other Ambulatory Visit: Payer: Self-pay | Admitting: *Deleted

## 2021-09-22 DIAGNOSIS — Z1231 Encounter for screening mammogram for malignant neoplasm of breast: Secondary | ICD-10-CM

## 2021-10-09 ENCOUNTER — Inpatient Hospital Stay: Admission: RE | Admit: 2021-10-09 | Payer: Medicaid Other | Source: Ambulatory Visit

## 2021-10-09 ENCOUNTER — Other Ambulatory Visit: Payer: Medicaid Other

## 2021-10-13 ENCOUNTER — Ambulatory Visit
Admission: RE | Admit: 2021-10-13 | Discharge: 2021-10-13 | Disposition: A | Discharge status: Home / Self Care | Payer: Medicaid Other | Source: Ambulatory Visit | Attending: Family Medicine | Admitting: Family Medicine

## 2021-10-13 ENCOUNTER — Other Ambulatory Visit: Payer: Self-pay

## 2021-10-13 DIAGNOSIS — N6311 Unspecified lump in the right breast, upper outer quadrant: Secondary | ICD-10-CM | POA: Diagnosis present

## 2022-05-09 ENCOUNTER — Ambulatory Visit
Admission: EM | Admit: 2022-05-09 | Discharge: 2022-05-09 | Disposition: A | Discharge status: Home / Self Care | Payer: Medicaid Other | Attending: Urgent Care | Admitting: Urgent Care

## 2022-05-09 DIAGNOSIS — R310 Gross hematuria: Secondary | ICD-10-CM | POA: Diagnosis not present

## 2022-05-09 DIAGNOSIS — R399 Unspecified symptoms and signs involving the genitourinary system: Secondary | ICD-10-CM | POA: Diagnosis not present

## 2022-05-09 DIAGNOSIS — R109 Unspecified abdominal pain: Secondary | ICD-10-CM | POA: Diagnosis not present

## 2022-05-09 LAB — POCT URINALYSIS DIP (MANUAL ENTRY)
Bilirubin, UA: NEGATIVE
Glucose, UA: NEGATIVE mg/dL
Ketones, POC UA: NEGATIVE mg/dL
Nitrite, UA: NEGATIVE
Protein Ur, POC: 30 mg/dL — AB
Spec Grav, UA: >=1.03 — AB (ref 1.010–1.025)
Urobilinogen, UA: 0.2 E.U./dL
pH, UA: 6 (ref 5.0–8.0)

## 2022-05-09 MED ORDER — SULFAMETHOXAZOLE-TRIMETHOPRIM 800-160 MG PO TABS: 1.0000 | ORAL_TABLET | Freq: Two times a day (BID) | ORAL | 0 refills | Status: AC

## 2022-05-09 MED ORDER — FLUCONAZOLE 150 MG PO TABS: 150.0000 mg | ORAL_TABLET | Freq: Once | ORAL | 0 refills | Status: AC

## 2022-05-09 NOTE — ED Provider Notes (Signed)
Jennifer Richmond    CSN: [id number] Arrival date & time: 05/09/22  1452      History   Chief Complaint Chief Complaint  Patient presents with   Hematuria    HPI Jennifer Richmond is a 36 y.o. female.   HPI  Patient presents to urgent care with report of 1 day history of hematuria and right side flank pain.  She endorses past medical history with frequent kidney stones.  She says she has burning pain with urination.  Past Medical History:  Diagnosis Date   Abdominal pain    Anxiety    Asthma    Back pain    Bipolar disorder (HCC)    GERD (gastroesophageal reflux disease)    Gross hematuria    Kidney stones    Panic attacks    Panic attacks    PTSD (post-traumatic stress disorder)    Urinary hesitancy    UTI (urinary tract infection)     Patient Active Problem List   Diagnosis Date Noted   Admission for sterilization 06/05/2016   Flank pain 03/05/2016   Nephrolithiasis 02/25/2016    Past Surgical History:  Procedure Laterality Date   CYSTOSCOPY WITH BIOPSY  05/11/2016   Procedure: CYSTOSCOPY WITH BIOPSY;  Surgeon: [name], MD;  Location: [facility];  Service: Urology;;   DILATION AND CURETTAGE OF UTERUS     LAPAROSCOPIC OVARIAN CYSTECTOMY     LAPAROSCOPIC TUBAL LIGATION Bilateral 06/05/2016   Procedure: LAPAROSCOPIC TUBAL LIGATION with bilateral salpingectomy;  Surgeon: Will Bonnet, MD;  Location: [facility];  Service: Gynecology;  Laterality: Bilateral;   URETEROSCOPY Right 05/11/2016   Procedure: URETEROSCOPY;  Surgeon: [name], MD;  Location: [facility];  Service: Urology;  Laterality: Right;    OB History     Gravida  4   Para  2   Term  2   Preterm      AB  2   Living  2      SAB  1   IAB  1   Ectopic      Multiple  0   Live Births  2            Home Medications    Prior to Admission medications   Medication Sig Start Date End Date Taking? Authorizing Provider  clonazePAM (KLONOPIN) 1 MG tablet Take 1 mg  by mouth 2 (two) times daily as needed for anxiety.     [provider]  guaiFENesin-codeine (CHERATUSSIN AC) 100-10 MG/5ML syrup Take 5 mLs by mouth 3 (three) times daily as needed for cough. 10/14/18   Lorin Picket, PA-C  ibuprofen (ADVIL,MOTRIN) 600 MG tablet Take 1 tablet (600 mg total) by mouth every 8 (eight) hours as needed. 11/13/16   Sable Feil, PA-C  pantoprazole (PROTONIX) 40 MG tablet Take 40 mg by mouth daily.    [provider]    Family History Family History  Problem Relation Age of Onset   Diabetes Father    Kidney nephrosis Father    Seizures Mother    Cancer Maternal Grandfather 2       pancreatic and colon 71s   Cancer Paternal Grandmother        ovarian   Kidney cancer Neg Hx    Prostate cancer Neg Hx    Bladder Cancer Neg Hx     Social History Social History   Tobacco Use   Smoking status: Every Day    Packs/day: 0.00  Years: 0.00    Total pack years: 0.00    Types: Cigarettes   Smokeless tobacco: Never   Tobacco comments:    1-3 cigarettes per day  Vaping Use   Vaping Use: Never used  Substance Use Topics   Alcohol use: Yes    Comment: Occ   Drug use: Yes    Types: Marijuana    Comment: PT DENIES BUT HAD A + UDS FOR MARIJUANA IN 2013     Allergies   Clindamycin/lincomycin and Shrimp [shellfish allergy]   Review of Systems Review of Systems   Physical Exam Triage Vital Signs ED Triage Vitals  Enc Vitals Group     BP 05/09/22 1546 107/63     Pulse Rate 05/09/22 1546 81     Resp 05/09/22 1546 20     Temp 05/09/22 1546 98.3 F (36.8 C)     Temp Source 05/09/22 1546 Oral     SpO2 05/09/22 1546 96 %     Weight --      Height --      Head Circumference --      Peak Flow --      Pain Score 05/09/22 1529 10     Pain Loc --      Pain Edu? --      Excl. in [place]? --    No data found.  Updated Vital Signs BP 107/63 (BP Location: Left Arm)   Pulse 81   Temp 98.3 F (36.8 C) (Oral)   Resp 20   LMP  05/02/2022 (Approximate)   SpO2 96%   Visual Acuity Right Eye Distance:   Left Eye Distance:   Bilateral Distance:    Right Eye Near:   Left Eye Near:    Bilateral Near:     Physical Exam   UC Treatments / Results  Labs (all labs ordered are listed, but only abnormal results are displayed) Labs Reviewed  POCT URINALYSIS DIP (MANUAL ENTRY) - Abnormal; Notable for the following components:      Result Value   Spec Grav, UA >=1.030 (*)    Blood, UA large (*)    Protein Ur, POC =30 (*)    Leukocytes, UA Trace (*)    All other components within normal limits    EKG   Radiology No results found.  Procedures Procedures (including critical care time)  Medications Ordered in UC Medications - No data to display  Initial Impression / Assessment and Plan / UC Course  I have reviewed the triage vital signs and the nursing notes.  Pertinent labs & imaging results that were available during my care of the patient were reviewed by me and considered in my medical decision making (see chart for details).   UA is not conclusive for acute cystitis.  There are trace leukocytes, gross blood.  Will treat with antibiotic for presumed UTI and encouraged patient to hydrate and prepared to pass another stone.  She is unwilling to transfer to ED because of personal stress associated with the hospital.   Final Clinical Impressions(s) / UC Diagnoses   Final diagnoses:  UTI symptoms  Flank pain  Gross hematuria   Discharge Instructions   None    ED Prescriptions   None    PDMP not reviewed this encounter.   Rose Phi, [place] 05/09/22 1553

## 2022-05-09 NOTE — Discharge Instructions (Addendum)
Follow-up with your urologist.  

## 2022-05-09 NOTE — ED Triage Notes (Signed)
Pt. States since yesterday she has been experiencing blood in her urine and right side abdominal pain.

## 2022-05-12 ENCOUNTER — Emergency Department
Admission: EM | Admit: 2022-05-12 | Discharge: 2022-05-12 | Disposition: A | Discharge status: Home / Self Care | Payer: Medicaid Other | Attending: Emergency Medicine | Admitting: Emergency Medicine

## 2022-05-12 ENCOUNTER — Emergency Department: Payer: Medicaid Other

## 2022-05-12 DIAGNOSIS — R109 Unspecified abdominal pain: Secondary | ICD-10-CM | POA: Diagnosis present

## 2022-05-12 DIAGNOSIS — N200 Calculus of kidney: Secondary | ICD-10-CM | POA: Diagnosis not present

## 2022-05-12 LAB — CBC
HCT: 41.5 % (ref 36.0–46.0)
Hemoglobin: 13.7 g/dL (ref 12.0–15.0)
MCH: 30.4 pg (ref 26.0–34.0)
MCHC: 33 g/dL (ref 30.0–36.0)
MCV: 92 fL (ref 80.0–100.0)
Platelets: 178 10*3/uL (ref 150–400)
RBC: 4.51 MIL/uL (ref 3.87–5.11)
RDW: 13 % (ref 11.5–15.5)
WBC: 7.1 10*3/uL (ref 4.0–10.5)
nRBC: 0 % (ref 0.0–0.2)

## 2022-05-12 LAB — WET PREP, GENITAL
Clue Cells Wet Prep HPF POC: NONE SEEN
Sperm: NONE SEEN
Trich, Wet Prep: NONE SEEN
WBC, Wet Prep HPF POC: <10 (ref <10)

## 2022-05-12 LAB — COMPREHENSIVE METABOLIC PANEL
ALT: 10 U/L (ref 0–44)
AST: 18 U/L (ref 15–41)
Albumin: 4.1 g/dL (ref 3.5–5.0)
Alkaline Phosphatase: 48 U/L (ref 38–126)
Anion gap: 8 (ref 5–15)
BUN: 10 mg/dL (ref 6–20)
CO2: 25 mmol/L (ref 22–32)
Calcium: 9.2 mg/dL (ref 8.9–10.3)
Chloride: 105 mmol/L (ref 98–111)
Creatinine, Ser: 1.04 mg/dL — ABNORMAL HIGH (ref 0.44–1.00)
GFR, Estimated: >60 mL/min (ref >60)
Glucose, Bld: 130 mg/dL — ABNORMAL HIGH (ref 70–99)
Potassium: 3.6 mmol/L (ref 3.5–5.1)
Sodium: 138 mmol/L (ref 135–145)
Total Bilirubin: 0.4 mg/dL (ref 0.3–1.2)
Total Protein: 6.8 g/dL (ref 6.5–8.1)

## 2022-05-12 LAB — URINALYSIS, ROUTINE W REFLEX MICROSCOPIC
Bacteria, UA: NONE SEEN
Bilirubin Urine: NEGATIVE
Glucose, UA: NEGATIVE mg/dL
Ketones, ur: NEGATIVE mg/dL
Leukocytes,Ua: NEGATIVE
Nitrite: NEGATIVE
Protein, ur: NEGATIVE mg/dL
Specific Gravity, Urine: 1.01 (ref 1.005–1.030)
pH: 6 (ref 5.0–8.0)

## 2022-05-12 LAB — CHLAMYDIA/NGC RT PCR (ARMC ONLY)
Chlamydia Tr: NOT DETECTED
N gonorrhoeae: NOT DETECTED

## 2022-05-12 LAB — LIPASE, BLOOD: Lipase: 25 U/L (ref 11–51)

## 2022-05-12 LAB — PREGNANCY, URINE: Preg Test, Ur: NEGATIVE

## 2022-05-12 MED ORDER — IOHEXOL 300 MG/ML  SOLN
75.0000 mL | Freq: Once | INTRAMUSCULAR | Status: AC | PRN
  Administered 2022-05-12: 100 mL via INTRAVENOUS

## 2022-05-12 MED ORDER — OXYCODONE-ACETAMINOPHEN 5-325 MG PO TABS
1.0000 | ORAL_TABLET | Freq: Once | ORAL | Status: AC
  Administered 2022-05-12: 1 via ORAL
  Filled 2022-05-12: qty 1

## 2022-05-12 MED ORDER — OXYCODONE-ACETAMINOPHEN 5-325 MG PO TABS: 1.0000 | ORAL_TABLET | Freq: Four times a day (QID) | ORAL | 0 refills | Status: AC | PRN

## 2022-05-12 MED ORDER — ONDANSETRON 4 MG PO TBDP: 4.0000 mg | ORAL_TABLET | Freq: Three times a day (TID) | ORAL | 0 refills | Status: AC | PRN

## 2022-05-12 MED ORDER — FLUCONAZOLE 150 MG PO TABS: 150.0000 mg | ORAL_TABLET | Freq: Every day | ORAL | 0 refills | Status: AC

## 2022-05-12 MED ORDER — ONDANSETRON 4 MG PO TBDP
4.0000 mg | ORAL_TABLET | Freq: Once | ORAL | Status: AC
  Administered 2022-05-12: 4 mg via ORAL
  Filled 2022-05-12: qty 1

## 2022-05-12 NOTE — ED Triage Notes (Signed)
Flank pain right side of vagina , right side stomach , and lower back

## 2022-05-12 NOTE — ED Provider Notes (Signed)
[facility] Provider Note  Patient Contact: 7:23 PM (approximate)   History   No chief complaint on file.   HPI  Jennifer Richmond is a 36 y.o. female with a history of abdominal pain, back pain, GERD and nephrolithiasis, presents to the emergency department with right-sided flank pain and vaginal discomfort.  Patient denies dyspareunia or changes in vaginal discharge.  She states that last week, she developed hematuria and was seen in urgent care prescribed her Bactrim.  She states that her symptoms have not improved and she has since developed some vaginal itching.  No chest pain, chest tightness or abdominal pain.      Physical Exam   Triage Vital Signs: ED Triage Vitals [05/12/22 1651]  Enc Vitals Group     BP 111/68     Pulse Rate 100     Resp 17     Temp 98.5 F (36.9 C)     Temp Source Oral     SpO2 97 %     Weight 108 lb 0.4 oz (49 kg)     Height '5\' 6"'$  (1.676 m)     Head Circumference      Peak Flow      Pain Score 5     Pain Loc      Pain Edu?      Excl. in [place]?     Most recent vital signs: Vitals:   05/12/22 1651  BP: 111/68  Pulse: 100  Resp: 17  Temp: 98.5 F (36.9 C)  SpO2: 97%     General: Alert and in no acute distress. Eyes:  PERRL. EOMI Head: No acute traumatic findings ENT:      Nose: No congestion/rhinnorhea.      Mouth/Throat: Mucous membranes are moist. Neck: No stridor. No cervical spine tenderness to palpation. Cardiovascular:  Good peripheral perfusion Respiratory: Normal respiratory effort without tachypnea or retractions. Lungs CTAB. Good air entry to the bases with no decreased or absent breath sounds. Gastrointestinal: Bowel sounds 4 quadrants. Soft and nontender to palpation. No guarding or rigidity. No palpable masses. No distention. No CVA tenderness. Genitourinary: Patient has right sided CVA tenderness.  Musculoskeletal: Full range of motion to all extremities.  Neurologic:  No gross focal  neurologic deficits are appreciated.  Skin:   No rash noted Other:   ED Results / Procedures / Treatments   Labs (all labs ordered are listed, but only abnormal results are displayed) Labs Reviewed  WET PREP, GENITAL - Abnormal; Notable for the following components:      Result Value   Yeast Wet Prep HPF POC PRESENT (*)    All other components within normal limits  COMPREHENSIVE METABOLIC PANEL - Abnormal; Notable for the following components:   Glucose, Bld 130 (*)    Creatinine, Ser 1.04 (*)    All other components within normal limits  URINALYSIS, ROUTINE W REFLEX MICROSCOPIC - Abnormal; Notable for the following components:   Color, Urine YELLOW (*)    APPearance CLOUDY (*)    Hgb urine dipstick MODERATE (*)    All other components within normal limits  CHLAMYDIA/NGC RT PCR ([facility] ONLY)            LIPASE, BLOOD  CBC  PREGNANCY, URINE  POC URINE PREG, ED       RADIOLOGY  I personally viewed and evaluated these images as part of my medical decision making, as well as reviewing the written report by the radiologist.  ED Provider Interpretation:  Patient has a 4 mm stone at right UVJ on CT abdomen pelvis.   PROCEDURES:  Critical Care performed: No  Procedures   MEDICATIONS ORDERED IN ED: Medications  oxyCODONE-acetaminophen (PERCOCET/ROXICET) 5-325 MG per tablet 1 tablet (has no administration in time range)  ondansetron (ZOFRAN-ODT) disintegrating tablet 4 mg (has no administration in time range)  iohexol (OMNIPAQUE) 300 MG/ML solution 75 mL (100 mLs Intravenous Contrast Given 05/12/22 1841)     IMPRESSION / MDM / ASSESSMENT AND PLAN / ED COURSE  I reviewed the triage vital signs and the nursing notes.                              Assessment and plan Flank pain 36 year old female presents to the emergency department with right-sided flank pain, right lower quadrant abdominal pain, hematuria and vaginal itching.  Vital signs are reassuring at triage.  On  exam, patient was alert, active and nontoxic-appearing.   CBC and CMP are reassuring.  Lipase within range.  Wet prep concerning for yeast vaginitis.  Moderate blood on urinalysis.  CT abdomen pelvis indicated a 4 mm stone at the right UVJ concerning for nephrolithiasis.  Patient assured that she has Flomax at home.  Will prescribe Percocet for pain and given a referral to urology.  FINAL CLINICAL IMPRESSION(S) / ED DIAGNOSES   Final diagnoses:  Nephrolithiasis     Rx / DC Orders   ED Discharge Orders          Ordered    oxyCODONE-acetaminophen (PERCOCET/ROXICET) 5-325 MG tablet  Every 6 hours PRN        05/12/22 1917    ondansetron (ZOFRAN-ODT) 4 MG disintegrating tablet  Every 8 hours PRN        05/12/22 1917    fluconazole (DIFLUCAN) 150 MG tablet  Daily        05/12/22 1918             Note:  This document was prepared using Dragon voice recognition software and may include unintentional dictation errors.   Vallarie Mare [place], PA-C 05/12/22 1927    Carrie Mew, MD 05/12/22 [phone]

## 2022-05-12 NOTE — Discharge Instructions (Addendum)
You can take Percocet for pain. Can take 1 Diflucan and repeat in 3 days if you are still having vaginal itching.

## 2022-09-06 ENCOUNTER — Emergency Department: Payer: Medicaid Other

## 2022-09-06 ENCOUNTER — Emergency Department
Admission: EM | Admit: 2022-09-06 | Discharge: 2022-09-06 | Disposition: A | Discharge status: Home / Self Care | Payer: Medicaid Other | Attending: Emergency Medicine | Admitting: Emergency Medicine

## 2022-09-06 ENCOUNTER — Other Ambulatory Visit: Payer: Self-pay

## 2022-09-06 DIAGNOSIS — R1032 Left lower quadrant pain: Secondary | ICD-10-CM | POA: Diagnosis not present

## 2022-09-06 DIAGNOSIS — R109 Unspecified abdominal pain: Secondary | ICD-10-CM

## 2022-09-06 DIAGNOSIS — R1012 Left upper quadrant pain: Secondary | ICD-10-CM | POA: Insufficient documentation

## 2022-09-06 DIAGNOSIS — R1031 Right lower quadrant pain: Secondary | ICD-10-CM | POA: Diagnosis not present

## 2022-09-06 DIAGNOSIS — R1011 Right upper quadrant pain: Secondary | ICD-10-CM | POA: Diagnosis not present

## 2022-09-06 DIAGNOSIS — N200 Calculus of kidney: Secondary | ICD-10-CM

## 2022-09-06 LAB — COMPREHENSIVE METABOLIC PANEL
ALT: 13 U/L (ref 0–44)
AST: 19 U/L (ref 15–41)
Albumin: 4 g/dL (ref 3.5–5.0)
Alkaline Phosphatase: 48 U/L (ref 38–126)
Anion gap: 7 (ref 5–15)
BUN: 19 mg/dL (ref 6–20)
CO2: 21 mmol/L — ABNORMAL LOW (ref 22–32)
Calcium: 8.8 mg/dL — ABNORMAL LOW (ref 8.9–10.3)
Chloride: 109 mmol/L (ref 98–111)
Creatinine, Ser: 0.7 mg/dL (ref 0.44–1.00)
GFR, Estimated: >60 mL/min (ref >60)
Glucose, Bld: 112 mg/dL — ABNORMAL HIGH (ref 70–99)
Potassium: 3.9 mmol/L (ref 3.5–5.1)
Sodium: 137 mmol/L (ref 135–145)
Total Bilirubin: 0.8 mg/dL (ref 0.3–1.2)
Total Protein: 6.8 g/dL (ref 6.5–8.1)

## 2022-09-06 LAB — URINALYSIS, ROUTINE W REFLEX MICROSCOPIC
Bilirubin Urine: NEGATIVE
Glucose, UA: NEGATIVE mg/dL
Ketones, ur: 5 mg/dL — AB
Leukocytes,Ua: NEGATIVE
Nitrite: NEGATIVE
Protein, ur: 100 mg/dL — AB
RBC / HPF: >50 RBC/hpf — ABNORMAL HIGH (ref 0–5)
Specific Gravity, Urine: 1.011 (ref 1.005–1.030)
WBC, UA: NONE SEEN WBC/hpf (ref 0–5)
pH: 5 (ref 5.0–8.0)

## 2022-09-06 LAB — CBC
HCT: 44.3 % (ref 36.0–46.0)
Hemoglobin: 14.6 g/dL (ref 12.0–15.0)
MCH: 29.6 pg (ref 26.0–34.0)
MCHC: 33 g/dL (ref 30.0–36.0)
MCV: 89.9 fL (ref 80.0–100.0)
Platelets: 240 10*3/uL (ref 150–400)
RBC: 4.93 MIL/uL (ref 3.87–5.11)
RDW: 13 % (ref 11.5–15.5)
WBC: 15.1 10*3/uL — ABNORMAL HIGH (ref 4.0–10.5)
nRBC: 0 % (ref 0.0–0.2)

## 2022-09-06 LAB — POC URINE PREG, ED: Preg Test, Ur: NEGATIVE

## 2022-09-06 LAB — LIPASE, BLOOD: Lipase: 31 U/L (ref 11–51)

## 2022-09-06 MED ORDER — KETOROLAC TROMETHAMINE 30 MG/ML IJ SOLN
30.0000 mg | Freq: Once | INTRAMUSCULAR | Status: AC
  Administered 2022-09-06: 30 mg via INTRAVENOUS
  Filled 2022-09-06: qty 1

## 2022-09-06 MED ORDER — ONDANSETRON HCL 4 MG/2ML IJ SOLN
4.0000 mg | Freq: Once | INTRAMUSCULAR | Status: AC
  Administered 2022-09-06: 4 mg via INTRAVENOUS
  Filled 2022-09-06: qty 2

## 2022-09-06 MED ORDER — FENTANYL CITRATE PF 50 MCG/ML IJ SOSY
50.0000 ug | PREFILLED_SYRINGE | Freq: Once | INTRAMUSCULAR | Status: AC
  Administered 2022-09-06: 50 ug via INTRAVENOUS
  Filled 2022-09-06: qty 1

## 2022-09-06 MED ORDER — OXYCODONE-ACETAMINOPHEN 5-325 MG PO TABS
2.0000 | ORAL_TABLET | Freq: Once | ORAL | Status: AC
  Administered 2022-09-06: 2 via ORAL
  Filled 2022-09-06: qty 2

## 2022-09-06 MED ORDER — SODIUM CHLORIDE 0.9 % IV BOLUS
1000.0000 mL | Freq: Once | INTRAVENOUS | Status: AC
  Administered 2022-09-06: 1000 mL via INTRAVENOUS

## 2022-09-06 NOTE — Discharge Instructions (Signed)
Return to the emergency department for any fever, painful urination or any other symptom personally concerning to yourself.  Otherwise please take your pain and nausea medication as needed, as prescribed.  Do not drink alcohol or drive while taking your pain medication.  Please call the number provided for urology to arrange a follow-up appointment.

## 2022-09-06 NOTE — ED Notes (Signed)
Pt did not hit head or lose consciousness in lobby. Pt was dizzy and lowered self to floor and could not get up by self due to flank pain.

## 2022-09-06 NOTE — ED Notes (Signed)
EKG was shown to MD. Primary nurse started IV and ddrew labs.

## 2022-09-06 NOTE — ED Triage Notes (Signed)
Pt to ED for L flank pain, 2 episodes of syncope today, vomiting, and dizziness since this morning. Denies pain with urination. 8/10 sharp pain to L side, somewhat intermittent. Lips are dry. Pt was getting up to go urinate in lobby and fell down (but lowered self) to floor and said was unable to stand because dizziness. Palpated pulse in in 40s. Performing EKG. BP 107/69. States felt confused today and is somewhat irritable.

## 2022-09-06 NOTE — ED Provider Notes (Signed)
[facility] Provider Note    Event Date/Time   First MD Initiated Contact with Patient 09/06/22 1854     (approximate)  History   Chief Complaint: Flank Pain, Dizziness, Emesis, and Loss of Consciousness  HPI  Jennifer Richmond is a 37 y.o. female with a past medical history anxiety, gastric reflux, kidney stones, presents to the emergency department for left flank pain.  According to the patient since 10 AM this morning she has been experiencing sharp significant pain to her left back/left lower quadrant.  Patient states this feels identical to prior kidney stones.  Patient is also noted hematuria but no dysuria but states a recent UTI diagnosis and currently taking ciprofloxacin.  Last menstrual period was approximately 2 weeks ago.  No fever.  Physical Exam   Triage Vital Signs: ED Triage Vitals  Enc Vitals Group     BP 09/06/22 1808 106/69     Pulse Rate 09/06/22 1808 (!) 42     Resp 09/06/22 1808 20     Temp --      Temp src --      SpO2 09/06/22 1808 98 %     Weight 09/06/22 1813 90 lb (40.8 kg)     Height 09/06/22 1813 5' (1.524 m)     Head Circumference --      Peak Flow --      Pain Score 09/06/22 1812 8     Pain Loc --      Pain Edu? --      Excl. in [place]? --     Most recent vital signs: Vitals:   09/06/22 1830 09/06/22 1900  BP: 120/70 (!) 97/57  Pulse: (!) 47 (!) 46  Resp: (!) 30 16  SpO2: 99% 99%    General: Awake, no distress.  CV:  Good peripheral perfusion.  Regular rate and rhythm  Resp:  Normal effort.  Equal breath sounds bilaterally.  Abd:  No distention.  Soft, right left upper and lower quadrant tenderness to palpation.  No rebound or guarding.  No right-sided tenderness.  ED Results / Procedures / Treatments   EKG  EKG viewed and interpreted by myself shows sinus bradycardia 42 bpm with a narrow QRS, normal axis, normal intervals, no concerning ST changes.  RADIOLOGY  I have reviewed and interpreted CT scan appears  to have left hydroureteronephrosis from the left ureteral stone. Radiology confirms 4 mm left proximal ureteral stone.  MEDICATIONS ORDERED IN ED: Medications  sodium chloride 0.9 % bolus 1,000 mL (has no administration in time range)  fentaNYL (SUBLIMAZE) injection 50 mcg (has no administration in time range)  ondansetron (ZOFRAN) injection 4 mg (has no administration in time range)     IMPRESSION / MDM / ASSESSMENT AND PLAN / ED COURSE  I reviewed the triage vital signs and the nursing notes.  Patient's presentation is most consistent with acute presentation with potential threat to life or bodily function.  Patient presents emergency department for acute onset left flank pain this morning around 10 AM.  Patient describes the pain as sharp and very reminiscent of prior kidney stones.  Patient states she has had many kidney stones some of which have required lithotripsy and some a pass on their own.  Denies any fever.  Patient's blood work shows a mild leukocytosis 15,000, normal chemistry including renal function and a negative lipase negative LFTs.  Will obtain a urinalysis urine culture we will obtain a CT renal scan we will treat  pain nausea and IV hydrate.  Patient agreeable to plan of care.  Patient CT scan shows a left ureteral stone.  Patient is feeling much better after medications.  Lab work is reassuring besides mild leukocytosis reassuring urine, urine culture has been sent.  Chemistry is normal.  Discussed with the patient pain medication Flomax and Zofran.  Patient states she has an active prescription for Flomax.  Will prescribe pain and nausea medication have the patient follow-up with urology.  Patient agreeable to plan of care.  Provided my typical kidney stone return precautions  FINAL CLINICAL IMPRESSION(S) / ED DIAGNOSES   Flank pain    Note:  This document was prepared using Dragon voice recognition software and may include unintentional dictation errors.    Harvest Dark, MD 09/06/22 2116

## 2022-09-06 NOTE — ED Notes (Signed)
Pt ambulated to toilet and back to bed. 

## 2022-09-06 NOTE — ED Notes (Signed)
Pt advised she is currently on CIPRO for a UTI.

## 2022-09-07 ENCOUNTER — Emergency Department: Payer: Medicaid Other

## 2022-09-07 ENCOUNTER — Emergency Department
Admission: EM | Admit: 2022-09-07 | Discharge: 2022-09-07 | Disposition: A | Discharge status: Home / Self Care | Payer: Medicaid Other | Attending: Student in an Organized Health Care Education/Training Program | Admitting: Student in an Organized Health Care Education/Training Program

## 2022-09-07 DIAGNOSIS — N200 Calculus of kidney: Secondary | ICD-10-CM | POA: Diagnosis not present

## 2022-09-07 DIAGNOSIS — R109 Unspecified abdominal pain: Secondary | ICD-10-CM | POA: Diagnosis present

## 2022-09-07 LAB — COMPREHENSIVE METABOLIC PANEL
ALT: 12 U/L (ref 0–44)
AST: 21 U/L (ref 15–41)
Albumin: 3.9 g/dL (ref 3.5–5.0)
Alkaline Phosphatase: 44 U/L (ref 38–126)
Anion gap: 7 (ref 5–15)
BUN: 16 mg/dL (ref 6–20)
CO2: 23 mmol/L (ref 22–32)
Calcium: 8.6 mg/dL — ABNORMAL LOW (ref 8.9–10.3)
Chloride: 107 mmol/L (ref 98–111)
Creatinine, Ser: 0.82 mg/dL (ref 0.44–1.00)
GFR, Estimated: >60 mL/min (ref >60)
Glucose, Bld: 103 mg/dL — ABNORMAL HIGH (ref 70–99)
Potassium: 3.5 mmol/L (ref 3.5–5.1)
Sodium: 137 mmol/L (ref 135–145)
Total Bilirubin: 1 mg/dL (ref 0.3–1.2)
Total Protein: 6.5 g/dL (ref 6.5–8.1)

## 2022-09-07 LAB — URINE CULTURE: Culture: NO GROWTH

## 2022-09-07 LAB — URINALYSIS, ROUTINE W REFLEX MICROSCOPIC
Bilirubin Urine: NEGATIVE
Glucose, UA: NEGATIVE mg/dL
Ketones, ur: NEGATIVE mg/dL
Nitrite: NEGATIVE
Protein, ur: 100 mg/dL — AB
RBC / HPF: >50 RBC/hpf — ABNORMAL HIGH (ref 0–5)
Specific Gravity, Urine: 1.013 (ref 1.005–1.030)
pH: 5 (ref 5.0–8.0)

## 2022-09-07 LAB — CBC
HCT: 43.6 % (ref 36.0–46.0)
Hemoglobin: 14.2 g/dL (ref 12.0–15.0)
MCH: 30.1 pg (ref 26.0–34.0)
MCHC: 32.6 g/dL (ref 30.0–36.0)
MCV: 92.4 fL (ref 80.0–100.0)
Platelets: 237 10*3/uL (ref 150–400)
RBC: 4.72 MIL/uL (ref 3.87–5.11)
RDW: 13 % (ref 11.5–15.5)
WBC: 12.6 10*3/uL — ABNORMAL HIGH (ref 4.0–10.5)
nRBC: 0 % (ref 0.0–0.2)

## 2022-09-07 MED ORDER — OXYCODONE-ACETAMINOPHEN 5-325 MG PO TABS
2.0000 | ORAL_TABLET | Freq: Once | ORAL | Status: AC
  Administered 2022-09-07: 2 via ORAL
  Filled 2022-09-07: qty 2

## 2022-09-07 MED ORDER — ONDANSETRON HCL 4 MG/2ML IJ SOLN
4.0000 mg | Freq: Once | INTRAMUSCULAR | Status: AC
  Administered 2022-09-07: 4 mg via INTRAVENOUS
  Filled 2022-09-07: qty 2

## 2022-09-07 MED ORDER — TAMSULOSIN HCL 0.4 MG PO CAPS
0.4000 mg | ORAL_CAPSULE | Freq: Once | ORAL | Status: AC
  Administered 2022-09-07: 0.4 mg via ORAL
  Filled 2022-09-07: qty 1

## 2022-09-07 MED ORDER — OXYCODONE-ACETAMINOPHEN 5-325 MG PO TABS: 1.0000 | ORAL_TABLET | Freq: Four times a day (QID) | ORAL | 0 refills | Status: DC | PRN

## 2022-09-07 MED ORDER — MORPHINE SULFATE (PF) 4 MG/ML IV SOLN
4.0000 mg | Freq: Once | INTRAVENOUS | Status: AC
  Administered 2022-09-07: 4 mg via INTRAVENOUS
  Filled 2022-09-07: qty 1

## 2022-09-07 MED ORDER — KETOROLAC TROMETHAMINE 30 MG/ML IJ SOLN
30.0000 mg | Freq: Once | INTRAMUSCULAR | Status: AC
  Administered 2022-09-07: 30 mg via INTRAVENOUS
  Filled 2022-09-07: qty 1

## 2022-09-07 MED ORDER — SODIUM CHLORIDE 0.9 % IV BOLUS
1000.0000 mL | Freq: Once | INTRAVENOUS | Status: AC
  Administered 2022-09-07: 1000 mL via INTRAVENOUS

## 2022-09-07 MED ORDER — SODIUM CHLORIDE 0.9 % IV BOLUS
500.0000 mL | Freq: Once | INTRAVENOUS | Status: AC
  Administered 2022-09-07: 500 mL via INTRAVENOUS

## 2022-09-07 MED ORDER — TAMSULOSIN HCL 0.4 MG PO CAPS: 0.4000 mg | ORAL_CAPSULE | Freq: Every day | ORAL | 0 refills | Status: DC

## 2022-09-07 MED ORDER — ONDANSETRON 4 MG PO TBDP: 4.0000 mg | ORAL_TABLET | Freq: Three times a day (TID) | ORAL | 0 refills | Status: DC | PRN

## 2022-09-07 NOTE — ED Triage Notes (Signed)
Pt was her yesterday for left sided flank pain. Pt was diagnosed with a left sided 8m obstructing kidney stone that is causing hydronephrosis. Per pt the pain is getting worse.

## 2022-09-07 NOTE — ED Provider Notes (Signed)
[facility] Provider Note  Patient Contact: 4:50 PM (approximate)   History   Flank Pain   HPI  Jennifer Richmond is a 37 y.o. female who presents the emergency department for ongoing pain from a known kidney stone.  Patient has been currently treated with Cipro for UTI, presented yesterday with flank pain.  Workup yesterday revealed that patient has nephrolithiasis in the proximal ureter on the left side with mild hydronephrosis.  She did have an elevated white blood cell count of 15,000 yesterday.  Patient was supposed to have prescription for nausea medication, Flomax and pain medication prescribed for her.  She states that there were no medications at the pharmacy.  She has not had any medicines today for symptom control.  Denies fever, chills.     Physical Exam   Triage Vital Signs: ED Triage Vitals  Enc Vitals Group     BP 09/07/22 1509 97/66     Pulse Rate 09/07/22 1509 (!) 55     Resp 09/07/22 1509 16     Temp 09/07/22 1509 98.1 F (36.7 C)     Temp Source 09/07/22 1509 Oral     SpO2 09/07/22 1509 99 %     Weight --      Height --      Head Circumference --      Peak Flow --      Pain Score 09/07/22 1516 10     Pain Loc --      Pain Edu? --      Excl. in [place]? --     Most recent vital signs: Vitals:   09/07/22 1509  BP: 97/66  Pulse: (!) 55  Resp: 16  Temp: 98.1 F (36.7 C)  SpO2: 99%     General: Alert and in no acute distress.  Cardiovascular:  Good peripheral perfusion Respiratory: Normal respiratory effort without tachypnea or retractions. Lungs CTAB.  Gastrointestinal: Bowel sounds 4 quadrants. Soft and nontender to palpation. No guarding or rigidity. No palpable masses. No distention.  Left CVA tenderness.  No swelling to the Musculoskeletal: Full range of motion to all extremities.  Neurologic:  No gross focal neurologic deficits are appreciated.  Skin:   No rash noted Other:   ED Results / Procedures / Treatments    Labs (all labs ordered are listed, but only abnormal results are displayed) Labs Reviewed  COMPREHENSIVE METABOLIC PANEL - Abnormal; Notable for the following components:      Result Value   Glucose, Bld 103 (*)    Calcium 8.6 (*)    All other components within normal limits  CBC - Abnormal; Notable for the following components:   WBC 12.6 (*)    All other components within normal limits  URINALYSIS, ROUTINE W REFLEX MICROSCOPIC - Abnormal; Notable for the following components:   Color, Urine YELLOW (*)    APPearance CLOUDY (*)    Hgb urine dipstick LARGE (*)    Protein, ur 100 (*)    Leukocytes,Ua TRACE (*)    RBC / HPF >50 (*)    Bacteria, UA RARE (*)    All other components within normal limits     EKG     RADIOLOGY  I personally viewed, evaluated, and interpreted these images as part of my medical decision making, as well as reviewing the written report by the radiologist.  ED Provider Interpretation: Patient with ongoing nephrolithiasis measuring roughly 4 mm in the left ureter.  Compared to previous scan from  yesterday no significant change.  CT Renal Stone Study  Result Date: 09/07/2022 CLINICAL DATA:  Left flank pain. EXAM: CT ABDOMEN AND PELVIS WITHOUT CONTRAST TECHNIQUE: Multidetector CT imaging of the abdomen and pelvis was performed following the standard protocol without IV contrast. RADIATION DOSE REDUCTION: This exam was performed according to the departmental dose-optimization program which includes automated exposure control, adjustment of the mA and/or kV according to patient size and/or use of iterative reconstruction technique. COMPARISON:  March 07, 2023 FINDINGS: Lower chest: No acute abnormality. Hepatobiliary: No focal liver abnormality is seen. No gallstones, gallbladder wall thickening, or biliary dilatation. Pancreas: Unremarkable. No pancreatic ductal dilatation or surrounding inflammatory changes. Spleen: Normal in size without focal abnormality.  Adrenals/Urinary Tract: Adrenal glands are unremarkable. Kidneys are normal in size, without focal lesions. A 4 mm obstructing renal calculus is seen within the mid left ureter, with mild left-sided hydronephrosis and hydroureter. This is unchanged in position when compared to the prior study. Bladder is unremarkable. Stomach/Bowel: Stomach is within normal limits. Appendix appears normal. No evidence of bowel wall thickening, distention, or inflammatory changes. Vascular/Lymphatic: No significant vascular findings are present. No enlarged abdominal or pelvic lymph nodes. Reproductive: The uterus and right adnexa are unremarkable. A 2.4 cm x 2.0 cm simple cyst is seen within the anterior aspect of the left adnexa. Other: No abdominal wall hernia or abnormality. No abdominopelvic ascites. Musculoskeletal: No acute or significant osseous findings. IMPRESSION: 1. 4 mm obstructing renal calculus within the mid left ureter, unchanged in location when compared to the prior study. 2. 2.4 cm x 2.0 cm simple left adnexal cyst, likely ovarian in origin. Electronically Signed   By: Virgina Norfolk M.D.   On: 09/07/2022 17:17   CT Renal Stone Study  Result Date: 09/06/2022 CLINICAL DATA:  Left-sided flank pain,  initial encounter EXAM: CT ABDOMEN AND PELVIS WITHOUT CONTRAST TECHNIQUE: Multidetector CT imaging of the abdomen and pelvis was performed following the standard protocol without IV contrast. RADIATION DOSE REDUCTION: This exam was performed according to the departmental dose-optimization program which includes automated exposure control, adjustment of the mA and/or kV according to patient size and/or use of iterative reconstruction technique. COMPARISON:  05/12/2022 FINDINGS: Lower chest: No acute abnormality. Hepatobiliary: No focal liver abnormality is seen. No gallstones, gallbladder wall thickening, or biliary dilatation. Pancreas: Unremarkable. No pancreatic ductal dilatation or surrounding inflammatory  changes. Spleen: Normal in size without focal abnormality. Adrenals/Urinary Tract: Adrenal glands are within normal limits. Kidneys are well visualized bilaterally. Previously seen left renal stone is noted in the proximal left ureter causing moderate hydronephrosis. The more distal left ureter is within normal limits. Previously seen right UVJ stone is no longer identified. The bladder is decompressed. Stomach/Bowel: Scattered diverticular change without evidence of diverticulitis. Retained fecal material is seen without obstructive change. The appendix is well visualized and within normal limits. The small bowel and stomach are within normal limits. Vascular/Lymphatic: Aortic atherosclerosis. No enlarged abdominal or pelvic lymph nodes. Reproductive: Uterus is within normal limits. Simple cysts are noted within the left ovary measuring up to 2.4 cm. Other: No abdominal wall hernia or abnormality. No abdominopelvic ascites. Musculoskeletal: No acute or significant osseous findings. IMPRESSION: Previously seen 4 mm left renal calculus now lies in the proximal left ureter with increasing hydronephrosis when compared with the prior exam. Previously seen distal right ureteral stone is no longer identified. Diverticulosis without diverticulitis. 2.4 cm left ovarian simple-appearing cyst. No follow-up imaging is recommended. Reference: JACR 2020 Feb;17(2):248-254 Electronically Signed  By: Inez Catalina M.D.   On: 09/06/2022 19:54    PROCEDURES:  Critical Care performed: No  Procedures   MEDICATIONS ORDERED IN ED: Medications  tamsulosin (FLOMAX) capsule 0.4 mg (has no administration in time range)  ketorolac (TORADOL) 30 MG/ML injection 30 mg (has no administration in time range)  oxyCODONE-acetaminophen (PERCOCET/ROXICET) 5-325 MG per tablet 2 tablet (has no administration in time range)  sodium chloride 0.9 % bolus 1,000 mL (1,000 mLs Intravenous New Bag/Given 09/07/22 1733)  morphine (PF) 4 MG/ML  injection 4 mg (4 mg Intravenous Given 09/07/22 1733)  ondansetron (ZOFRAN) injection 4 mg (4 mg Intravenous Given 09/07/22 1734)     IMPRESSION / MDM / ASSESSMENT AND PLAN / ED COURSE  I reviewed the triage vital signs and the nursing notes.                                 Differential diagnosis includes, but is not limited to, urolithiasis, pyelonephritis, infected kidney stone  Patient's presentation is most consistent with acute presentation with potential threat to life or bodily function.   Patient's diagnosis is consistent with nephrolithiasis.  Patient has returned to the emergency department for ongoing pain.  Patient was seen yesterday, diagnosed with nephrolithiasis.  Patient was supposed to have next, Zofran and Percocet prescribed.  However patient did not have any of these medications sent to her pharmacy.  She will return primarily for pain control.  Labs are reassuring currently.  Imaging reveals no significant change in position of stone or hydroureter or hydro process.  At this time prescriptions have been called in for symptom control.  Term cautions discussed with the patient.  If symptoms are not improving follow-up with urology.  Patient is given ED precautions to return to the ED for any worsening or new symptoms.     FINAL CLINICAL IMPRESSION(S) / ED DIAGNOSES   Final diagnoses:  Nephrolithiasis     Rx / DC Orders   ED Discharge Orders          Ordered    oxyCODONE-acetaminophen (PERCOCET/ROXICET) 5-325 MG tablet  Every 6 hours PRN        09/07/22 1749    tamsulosin (FLOMAX) 0.4 MG CAPS capsule  Daily        09/07/22 1749    ondansetron (ZOFRAN-ODT) 4 MG disintegrating tablet  Every 8 hours PRN        09/07/22 1749             Note:  This document was prepared using Dragon voice recognition software and may include unintentional dictation errors.   Brynda Peon 09/07/22 Donna Bernard, MD 09/07/22 2158

## 2022-09-07 NOTE — ED Notes (Signed)
Cuthriell, PA-C, made aware of abnormal vital signs.

## 2022-09-10 ENCOUNTER — Other Ambulatory Visit: Payer: Self-pay

## 2022-09-10 ENCOUNTER — Encounter: Payer: Self-pay | Admitting: Urology

## 2022-09-10 ENCOUNTER — Encounter
Admission: RE | Disposition: A | Discharge status: Home / Self Care | Payer: Self-pay | Source: Home / Self Care | Attending: Urology

## 2022-09-10 ENCOUNTER — Ambulatory Visit (INDEPENDENT_AMBULATORY_CARE_PROVIDER_SITE_OTHER): Payer: Medicaid Other | Admitting: Urology

## 2022-09-10 ENCOUNTER — Ambulatory Visit: Payer: Medicaid Other

## 2022-09-10 ENCOUNTER — Ambulatory Visit
Admission: RE | Admit: 2022-09-10 | Discharge: 2022-09-10 | Disposition: A | Discharge status: Home / Self Care | Payer: Medicaid Other | Attending: Urology | Admitting: Urology

## 2022-09-10 VITALS — BP 91/61 | HR 64 | Ht 65.0 in | Wt 105.0 lb

## 2022-09-10 DIAGNOSIS — N200 Calculus of kidney: Secondary | ICD-10-CM | POA: Insufficient documentation

## 2022-09-10 DIAGNOSIS — F17210 Nicotine dependence, cigarettes, uncomplicated: Secondary | ICD-10-CM | POA: Insufficient documentation

## 2022-09-10 DIAGNOSIS — I700 Atherosclerosis of aorta: Secondary | ICD-10-CM | POA: Insufficient documentation

## 2022-09-10 DIAGNOSIS — N201 Calculus of ureter: Secondary | ICD-10-CM

## 2022-09-10 HISTORY — PX: EXTRACORPOREAL SHOCK WAVE LITHOTRIPSY: SHX1557

## 2022-09-10 SURGERY — LITHOTRIPSY, ESWL
Anesthesia: Moderate Sedation | Laterality: Left

## 2022-09-10 MED ORDER — DIPHENHYDRAMINE HCL 25 MG PO CAPS
ORAL_CAPSULE | ORAL | Status: AC
  Filled 2022-09-10: qty 1

## 2022-09-10 MED ORDER — DIAZEPAM 5 MG PO TABS
ORAL_TABLET | ORAL | Status: AC
  Filled 2022-09-10: qty 2

## 2022-09-10 MED ORDER — CEPHALEXIN 500 MG PO CAPS
500.0000 mg | ORAL_CAPSULE | Freq: Once | ORAL | Status: AC
  Administered 2022-09-10: 500 mg via ORAL

## 2022-09-10 MED ORDER — SODIUM CHLORIDE 0.9 % IV SOLN: INTRAVENOUS | Status: DC

## 2022-09-10 MED ORDER — DIPHENHYDRAMINE HCL 25 MG PO CAPS
25.0000 mg | ORAL_CAPSULE | ORAL | Status: AC
  Administered 2022-09-10: 25 mg via ORAL

## 2022-09-10 MED ORDER — CEPHALEXIN 500 MG PO CAPS
ORAL_CAPSULE | ORAL | Status: AC
  Filled 2022-09-10: qty 1

## 2022-09-10 MED ORDER — ONDANSETRON HCL 4 MG/2ML IJ SOLN
INTRAMUSCULAR | Status: AC
  Administered 2022-09-10: 4 mg via INTRAVENOUS
  Filled 2022-09-10: qty 2

## 2022-09-10 MED ORDER — ONDANSETRON HCL 4 MG/2ML IJ SOLN: 4.0000 mg | Freq: Once | INTRAMUSCULAR | Status: AC

## 2022-09-10 MED ORDER — DIAZEPAM 5 MG PO TABS
10.0000 mg | ORAL_TABLET | ORAL | Status: AC
  Administered 2022-09-10: 10 mg via ORAL

## 2022-09-10 NOTE — Discharge Instructions (Signed)
As per the [facility] discharge instructions Continue pain and nausea medication as needed Continue tamsulosin which will help you pass stone fragments Call [facility] at [phone] for pain not controlled with oral medications or fever greater than 101 degrees Our office will contact you for a postop follow-up appointment

## 2022-09-10 NOTE — Progress Notes (Signed)
[facility] ORDER FORM  Expected date of procedure:  09/10/2022  Surgeon: John Giovanni, MD  Post op standing: 2-4wk follow up w/KUB prior  Anticoagulation/Aspirin/NSAID standing order: Hold all 72 hours prior  Anesthesia standing order: MAC  VTE standing: SCD's  Dx: Left Ureteral Stone  Procedure: Left Extracorporeal Shock Wave Lithotripsy  CPT : 57473  Standing Order Set:   *NPO after mn, KUB  *NS 121m/hr, Keflex 5082mPO, Benadryl 2582mO, Valium 82m51m, Zofran 4mg 34m   Medications if other than standing orders:   NONE

## 2022-09-10 NOTE — H&P (View-Only) (Signed)
I, Jennifer Richmond,acting as a scribe for Jennifer Espy, MD.,have documented all relevant documentation on the behalf of Jennifer Espy, MD,as directed by  Jennifer Espy, MD while in the presence of Jennifer Espy, MD.   09/10/22 1:25 PM   Jennifer Richmond November 25, 1985 [id number]  Referring provider: Remi Haggard, [address] [place],  [place] [address]  Chief Complaint  Patient presents with   Establish Care   Nephrolithiasis    HPI: 37 year-old female who returns today to Urology clinic for a follow-up of a kidney stone event. She has a personal history of kidney stones and underwent negative ureteroscopy for a small stone, in 2017. 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   She was most recently in the emergency room on several occasions, including on 1/14 as well as 1/15, and had serial CT scans indicating progression of a left-sided ureteral calculus initially in the proximal ureter on the 14th and then the mid-ureter on the 15th. Her urinalysis appeared consistent with a stone episode. Urine culture was negative and BMP was normal. Initally, she presented with an elevated WC of 15, but her subsequent visit, her white blood cell count was trending downwards to 12.6.  She states that she is doing better but experiencing some nausea and dizziness. She was sent home from the ER with Percocet, Flomax, and nausea medications.  PMH: Past Medical History:  Diagnosis Date   Abdominal pain    Anxiety    Asthma    Back pain    Bipolar disorder (HCC)    GERD (gastroesophageal reflux disease)    Gross hematuria    Kidney stones    Panic attacks    Panic attacks    PTSD (post-traumatic stress disorder)    Urinary hesitancy    UTI (urinary tract infection)     Surgical History: Past Surgical History:  Procedure Laterality Date   CYSTOSCOPY WITH BIOPSY  05/11/2016   Procedure: CYSTOSCOPY WITH  BIOPSY;  Surgeon: Jennifer Espy, MD;  Location: [facility] ORS;  Service: Urology;;   DILATION AND CURETTAGE OF UTERUS     LAPAROSCOPIC OVARIAN CYSTECTOMY     LAPAROSCOPIC TUBAL LIGATION Bilateral 06/05/2016   Procedure: LAPAROSCOPIC TUBAL LIGATION with bilateral salpingectomy;  Surgeon: Will Bonnet, MD;  Location: [facility] ORS;  Service: Gynecology;  Laterality: Bilateral;   URETEROSCOPY Right 05/11/2016   Procedure: URETEROSCOPY;  Surgeon: Jennifer Espy, MD;  Location: [facility] ORS;  Service: Urology;  Laterality: Right;    Home Medications:  Allergies as of 09/10/2022       Reactions   Clindamycin/lincomycin    Possibly CLINDAMYCIN vaginal gel. Patient uncertain of drug   Shrimp [shellfish Allergy]         Medication List        Accurate as of September 10, 2022  1:05 PM. If you have any questions, ask your nurse or doctor.          clonazePAM 1 MG tablet Commonly known as: KLONOPIN Take 1 mg by mouth 2 (two) times daily as needed for anxiety.   guaiFENesin-codeine 100-10 MG/5ML syrup Commonly known as: Cheratussin AC Take 5 mLs by mouth 3 (three) times daily as needed for cough.   ibuprofen 600 MG tablet Commonly known as: ADVIL Take 1 tablet (600 mg total) by mouth every 8 (eight) hours as needed.   ondansetron 4 MG disintegrating tablet Commonly known as: ZOFRAN-ODT Take 1 tablet (  4 mg total) by mouth every 8 (eight) hours as needed.   oxyCODONE-acetaminophen 5-325 MG tablet Commonly known as: PERCOCET/ROXICET Take 1 tablet by mouth every 6 (six) hours as needed for severe pain.   pantoprazole 40 MG tablet Commonly known as: PROTONIX Take 40 mg by mouth daily.   tamsulosin 0.4 MG Caps capsule Commonly known as: FLOMAX Take 1 capsule (0.4 mg total) by mouth daily.        Allergies:  Allergies  Allergen Reactions   Clindamycin/Lincomycin     Possibly CLINDAMYCIN vaginal gel. Patient uncertain of drug   Shrimp [Shellfish Allergy]     Family  History: Family History  Problem Relation Age of Onset   Diabetes Father    Kidney nephrosis Father    Seizures Mother    Cancer Maternal Grandfather 45       pancreatic and colon 74s   Cancer Paternal Grandmother        ovarian   Kidney cancer Neg Hx    Prostate cancer Neg Hx    Bladder Cancer Neg Hx     Social History:  reports that she has been smoking cigarettes. She has never used smokeless tobacco. She reports current alcohol use. She reports current drug use. Drug: Marijuana.   Physical Exam: BP 91/61   Pulse 64   Ht '5\' 5"'$  (1.651 m)   Wt 105 lb (47.6 kg)   BMI 17.47 kg/m   Constitutional:  Alert and oriented, No acute distress. HEENT: [place] AT, moist mucus membranes.  Trachea midline, no masses. Neurologic: Grossly intact, no focal deficits, moving all 4 extremities. Psychiatric: Normal mood and affect.  Pertinent Imaging: Results for orders placed during the hospital encounter of 09/07/22  CT Renal Stone Study  Narrative CLINICAL DATA:  Left flank pain.  EXAM: CT ABDOMEN AND PELVIS WITHOUT CONTRAST  TECHNIQUE: Multidetector CT imaging of the abdomen and pelvis was performed following the standard protocol without IV contrast.  RADIATION DOSE REDUCTION: This exam was performed according to the departmental dose-optimization program which includes automated exposure control, adjustment of the mA and/or kV according to patient size and/or use of iterative reconstruction technique.  COMPARISON:  March 07, 2023  FINDINGS: Lower chest: No acute abnormality.  Hepatobiliary: No focal liver abnormality is seen. No gallstones, gallbladder wall thickening, or biliary dilatation.  Pancreas: Unremarkable. No pancreatic ductal dilatation or surrounding inflammatory changes.  Spleen: Normal in size without focal abnormality.  Adrenals/Urinary Tract: Adrenal glands are unremarkable. Kidneys are normal in size, without focal lesions. A 4 mm obstructing  renal calculus is seen within the mid left ureter, with mild left-sided hydronephrosis and hydroureter. This is unchanged in position when compared to the prior study. Bladder is unremarkable.  Stomach/Bowel: Stomach is within normal limits. Appendix appears normal. No evidence of bowel wall thickening, distention, or inflammatory changes.  Vascular/Lymphatic: No significant vascular findings are present. No enlarged abdominal or pelvic lymph nodes.  Reproductive: The uterus and right adnexa are unremarkable. A 2.4 cm x 2.0 cm simple cyst is seen within the anterior aspect of the left adnexa.  Other: No abdominal wall hernia or abnormality. No abdominopelvic ascites.  Musculoskeletal: No acute or significant osseous findings.  IMPRESSION: 1. 4 mm obstructing renal calculus within the mid left ureter, unchanged in location when compared to the prior study. 2. 2.4 cm x 2.0 cm simple left adnexal cyst, likely ovarian in origin.   Electronically Signed By: Virgina Norfolk M.D. On: 09/07/2022 17:17  CLINICAL DATA:  Left-sided  flank pain,  initial encounter   EXAM: CT ABDOMEN AND PELVIS WITHOUT CONTRAST   TECHNIQUE: Multidetector CT imaging of the abdomen and pelvis was performed following the standard protocol without IV contrast.   RADIATION DOSE REDUCTION: This exam was performed according to the departmental dose-optimization program which includes automated exposure control, adjustment of the mA and/or kV according to patient size and/or use of iterative reconstruction technique.   COMPARISON:  05/12/2022   FINDINGS: Lower chest: No acute abnormality.   Hepatobiliary: No focal liver abnormality is seen. No gallstones, gallbladder wall thickening, or biliary dilatation.   Pancreas: Unremarkable. No pancreatic ductal dilatation or surrounding inflammatory changes.   Spleen: Normal in size without focal abnormality.   Adrenals/Urinary Tract: Adrenal glands are  within normal limits. Kidneys are well visualized bilaterally. Previously seen left renal stone is noted in the proximal left ureter causing moderate hydronephrosis. The more distal left ureter is within normal limits. Previously seen right UVJ stone is no longer identified. The bladder is decompressed.   Stomach/Bowel: Scattered diverticular change without evidence of diverticulitis. Retained fecal material is seen without obstructive change. The appendix is well visualized and within normal limits. The small bowel and stomach are within normal limits.   Vascular/Lymphatic: Aortic atherosclerosis. No enlarged abdominal or pelvic lymph nodes.   Reproductive: Uterus is within normal limits. Simple cysts are noted within the left ovary measuring up to 2.4 cm.   Other: No abdominal wall hernia or abnormality. No abdominopelvic ascites.   Musculoskeletal: No acute or significant osseous findings.   IMPRESSION: Previously seen 4 mm left renal calculus now lies in the proximal left ureter with increasing hydronephrosis when compared with the prior exam.   Previously seen distal right ureteral stone is no longer identified.   Diverticulosis without diverticulitis.   2.4 cm left ovarian simple-appearing cyst. No follow-up imaging is recommended.   Reference: JACR 2020 Feb;17(2):248-254     Electronically Signed   By: Inez Catalina M.D.   On: 09/06/2022 19:54  Personally reviewed and agree with radiologic interpretation.   Assessment & Plan:    Nephrolithiasis We discussed various treatment options for urolithiasis including observation with or without medical expulsive therapy, shockwave lithotripsy (SWL), ureteroscopy and laser lithotripsy with stent placement, and percutaneous nephrolithotomy.   We discussed that management is based on stone size, location, density, patient co-morbidities, and patient preference.    Stones <74m in size have a >80% spontaneous passage rate.  Data surrounding the use of tamsulosin for medical expulsive therapy is controversial, but meta analyses suggests it is most efficacious for distal stones between 5-160min size. Possible side effects include dizziness/lightheadedness, and retrograde ejaculation.   SWL has a lower stone free rate in a single procedure, but also a lower complication rate compared to ureteroscopy and avoids a stent and associated stent related symptoms. Possible complications include renal hematoma, steinstrasse, and need for additional treatment. We discussed the role of his increased skin to stone distance can lead to decreased efficacy with shockwave lithotripsy.   Ureteroscopy with laser lithotripsy and stent placement has a higher stone free rate than SWL in a single procedure, however increased complication rate including possible infection, ureteral injury, bleeding, and stent related morbidity. Common stent related symptoms include dysuria, urgency/frequency, and flank pain.   After an extensive discussion of the risks and benefits of the above treatment options, the patient would like to proceed with ESWL   Return for after surgery.  I have reviewed the above documentation for  accuracy and completeness, and I agree with the above.   Jennifer Espy, MD   [facility] [address], [place], [place] [address] [phone]

## 2022-09-10 NOTE — Progress Notes (Addendum)
I, Jeanmarie Hubert Maxie,acting as a scribe for [name], MD.,have documented all relevant documentation on the behalf of [name], MD,as directed by  [name], MD while in the presence of [name], MD.   09/10/22 1:25 PM   Jennifer Richmond 1985/12/01 [id number]  Referring provider: Remi Haggard, [address] [place],  [place] [address]  Chief Complaint  Patient presents with   Establish Care   Nephrolithiasis    HPI: 37 year-old female who returns today to Urology clinic for a follow-up of a kidney stone event. She has a personal history of kidney stones and underwent negative ureteroscopy for a small stone, in 2017. 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   She was most recently in the emergency room on several occasions, including on 1/14 as well as 1/15, and had serial CT scans indicating progression of a left-sided ureteral calculus initially in the proximal ureter on the 14th and then the mid-ureter on the 15th. Her urinalysis appeared consistent with a stone episode. Urine culture was negative and BMP was normal. Initally, she presented with an elevated WC of 15, but her subsequent visit, her white blood cell count was trending downwards to 12.6.  She states that she is doing better but experiencing some nausea and dizziness. She was sent home from the ER with Percocet, Flomax, and nausea medications.  PMH: Past Medical History:  Diagnosis Date   Abdominal pain    Anxiety    Asthma    Back pain    Bipolar disorder (HCC)    GERD (gastroesophageal reflux disease)    Gross hematuria    Kidney stones    Panic attacks    Panic attacks    PTSD (post-traumatic stress disorder)    Urinary hesitancy    UTI (urinary tract infection)     Surgical History: Past Surgical History:  Procedure Laterality Date   CYSTOSCOPY WITH BIOPSY  05/11/2016   Procedure: CYSTOSCOPY WITH  BIOPSY;  Surgeon: [name], MD;  Location: [facility];  Service: Urology;;   DILATION AND CURETTAGE OF UTERUS     LAPAROSCOPIC OVARIAN CYSTECTOMY     LAPAROSCOPIC TUBAL LIGATION Bilateral 06/05/2016   Procedure: LAPAROSCOPIC TUBAL LIGATION with bilateral salpingectomy;  Surgeon: Will Bonnet, MD;  Location: [facility];  Service: Gynecology;  Laterality: Bilateral;   URETEROSCOPY Right 05/11/2016   Procedure: URETEROSCOPY;  Surgeon: [name], MD;  Location: [facility];  Service: Urology;  Laterality: Right;    Home Medications:  Allergies as of 09/10/2022       Reactions   Clindamycin/lincomycin    Possibly CLINDAMYCIN vaginal gel. Patient uncertain of drug   Shrimp [shellfish Allergy]         Medication List        Accurate as of September 10, 2022  1:05 PM. If you have any questions, ask your nurse or doctor.          clonazePAM 1 MG tablet Commonly known as: KLONOPIN Take 1 mg by mouth 2 (two) times daily as needed for anxiety.   guaiFENesin-codeine 100-10 MG/5ML syrup Commonly known as: Cheratussin AC Take 5 mLs by mouth 3 (three) times daily as needed for cough.   ibuprofen 600 MG tablet Commonly known as: ADVIL Take 1 tablet (600 mg total) by mouth every 8 (eight) hours as needed.   ondansetron 4 MG disintegrating tablet Commonly known as: ZOFRAN-ODT Take 1 tablet (  4 mg total) by mouth every 8 (eight) hours as needed.   oxyCODONE-acetaminophen 5-325 MG tablet Commonly known as: PERCOCET/ROXICET Take 1 tablet by mouth every 6 (six) hours as needed for severe pain.   pantoprazole 40 MG tablet Commonly known as: PROTONIX Take 40 mg by mouth daily.   tamsulosin 0.4 MG Caps capsule Commonly known as: FLOMAX Take 1 capsule (0.4 mg total) by mouth daily.        Allergies:  Allergies  Allergen Reactions   Clindamycin/Lincomycin     Possibly CLINDAMYCIN vaginal gel. Patient uncertain of drug   Shrimp [Shellfish Allergy]     Family  History: Family History  Problem Relation Age of Onset   Diabetes Father    Kidney nephrosis Father    Seizures Mother    Cancer Maternal Grandfather 40       pancreatic and colon 67s   Cancer Paternal Grandmother        ovarian   Kidney cancer Neg Hx    Prostate cancer Neg Hx    Bladder Cancer Neg Hx     Social History:  reports that she has been smoking cigarettes. She has never used smokeless tobacco. She reports current alcohol use. She reports current drug use. Drug: Marijuana.   Physical Exam: BP 91/61   Pulse 64   Ht '5\' 5"'$  (1.651 m)   Wt 105 lb (47.6 kg)   BMI 17.47 kg/m   Constitutional:  Alert and oriented, No acute distress. HEENT: [place] AT, moist mucus membranes.  Trachea midline, no masses. Neurologic: Grossly intact, no focal deficits, moving all 4 extremities. Psychiatric: Normal mood and affect.  Pertinent Imaging: Results for orders placed during the hospital encounter of 09/07/22  CT Renal Stone Study  Narrative CLINICAL DATA:  Left flank pain.  EXAM: CT ABDOMEN AND PELVIS WITHOUT CONTRAST  TECHNIQUE: Multidetector CT imaging of the abdomen and pelvis was performed following the standard protocol without IV contrast.  RADIATION DOSE REDUCTION: This exam was performed according to the departmental dose-optimization program which includes automated exposure control, adjustment of the mA and/or kV according to patient size and/or use of iterative reconstruction technique.  COMPARISON:  March 07, 2023  FINDINGS: Lower chest: No acute abnormality.  Hepatobiliary: No focal liver abnormality is seen. No gallstones, gallbladder wall thickening, or biliary dilatation.  Pancreas: Unremarkable. No pancreatic ductal dilatation or surrounding inflammatory changes.  Spleen: Normal in size without focal abnormality.  Adrenals/Urinary Tract: Adrenal glands are unremarkable. Kidneys are normal in size, without focal lesions. A 4 mm obstructing  renal calculus is seen within the mid left ureter, with mild left-sided hydronephrosis and hydroureter. This is unchanged in position when compared to the prior study. Bladder is unremarkable.  Stomach/Bowel: Stomach is within normal limits. Appendix appears normal. No evidence of bowel wall thickening, distention, or inflammatory changes.  Vascular/Lymphatic: No significant vascular findings are present. No enlarged abdominal or pelvic lymph nodes.  Reproductive: The uterus and right adnexa are unremarkable. A 2.4 cm x 2.0 cm simple cyst is seen within the anterior aspect of the left adnexa.  Other: No abdominal wall hernia or abnormality. No abdominopelvic ascites.  Musculoskeletal: No acute or significant osseous findings.  IMPRESSION: 1. 4 mm obstructing renal calculus within the mid left ureter, unchanged in location when compared to the prior study. 2. 2.4 cm x 2.0 cm simple left adnexal cyst, likely ovarian in origin.   Electronically Signed By: Virgina Norfolk M.D. On: 09/07/2022 17:17  CLINICAL DATA:  Left-sided  flank pain,  initial encounter   EXAM: CT ABDOMEN AND PELVIS WITHOUT CONTRAST   TECHNIQUE: Multidetector CT imaging of the abdomen and pelvis was performed following the standard protocol without IV contrast.   RADIATION DOSE REDUCTION: This exam was performed according to the departmental dose-optimization program which includes automated exposure control, adjustment of the mA and/or kV according to patient size and/or use of iterative reconstruction technique.   COMPARISON:  05/12/2022   FINDINGS: Lower chest: No acute abnormality.   Hepatobiliary: No focal liver abnormality is seen. No gallstones, gallbladder wall thickening, or biliary dilatation.   Pancreas: Unremarkable. No pancreatic ductal dilatation or surrounding inflammatory changes.   Spleen: Normal in size without focal abnormality.   Adrenals/Urinary Tract: Adrenal glands are  within normal limits. Kidneys are well visualized bilaterally. Previously seen left renal stone is noted in the proximal left ureter causing moderate hydronephrosis. The more distal left ureter is within normal limits. Previously seen right UVJ stone is no longer identified. The bladder is decompressed.   Stomach/Bowel: Scattered diverticular change without evidence of diverticulitis. Retained fecal material is seen without obstructive change. The appendix is well visualized and within normal limits. The small bowel and stomach are within normal limits.   Vascular/Lymphatic: Aortic atherosclerosis. No enlarged abdominal or pelvic lymph nodes.   Reproductive: Uterus is within normal limits. Simple cysts are noted within the left ovary measuring up to 2.4 cm.   Other: No abdominal wall hernia or abnormality. No abdominopelvic ascites.   Musculoskeletal: No acute or significant osseous findings.   IMPRESSION: Previously seen 4 mm left renal calculus now lies in the proximal left ureter with increasing hydronephrosis when compared with the prior exam.   Previously seen distal right ureteral stone is no longer identified.   Diverticulosis without diverticulitis.   2.4 cm left ovarian simple-appearing cyst. No follow-up imaging is recommended.   Reference: JACR 2020 Feb;17(2):248-254     Electronically Signed   By: Inez Catalina M.D.   On: 09/06/2022 19:54  Personally reviewed and agree with radiologic interpretation.   Assessment & Plan:    Nephrolithiasis We discussed various treatment options for urolithiasis including observation with or without medical expulsive therapy, shockwave lithotripsy (SWL), ureteroscopy and laser lithotripsy with stent placement, and percutaneous nephrolithotomy.   We discussed that management is based on stone size, location, density, patient co-morbidities, and patient preference.    Stones <10m in size have a >80% spontaneous passage rate.  Data surrounding the use of tamsulosin for medical expulsive therapy is controversial, but meta analyses suggests it is most efficacious for distal stones between 5-123min size. Possible side effects include dizziness/lightheadedness, and retrograde ejaculation.   SWL has a lower stone free rate in a single procedure, but also a lower complication rate compared to ureteroscopy and avoids a stent and associated stent related symptoms. Possible complications include renal hematoma, steinstrasse, and need for additional treatment. We discussed the role of his increased skin to stone distance can lead to decreased efficacy with shockwave lithotripsy.   Ureteroscopy with laser lithotripsy and stent placement has a higher stone free rate than SWL in a single procedure, however increased complication rate including possible infection, ureteral injury, bleeding, and stent related morbidity. Common stent related symptoms include dysuria, urgency/frequency, and flank pain.   After an extensive discussion of the risks and benefits of the above treatment options, the patient would like to proceed with ESWL   Return for after surgery.  I have reviewed the above documentation for  accuracy and completeness, and I agree with the above.   [name], MD   [facility] [address], [place], [place] [address] [phone]

## 2022-09-11 ENCOUNTER — Encounter: Payer: Self-pay | Admitting: Urology

## 2022-09-11 ENCOUNTER — Other Ambulatory Visit: Payer: Self-pay

## 2022-09-11 DIAGNOSIS — N201 Calculus of ureter: Secondary | ICD-10-CM

## 2022-09-11 NOTE — Interval H&P Note (Signed)
History and Physical Interval Note:  CV:RRR Lungs:clear  09/11/2022 7:22 AM  Jennifer Richmond  has presented today for surgery, with the diagnosis of Left Ureteral Stone.  The various methods of treatment have been discussed with the patient and family. After consideration of risks, benefits and other options for treatment, the patient has consented to  Procedure(s): EXTRACORPOREAL SHOCK WAVE LITHOTRIPSY (ESWL) (Left) as a surgical intervention.  The patient's history has been reviewed, patient examined, no change in status, stable for surgery.  I have reviewed the patient's chart and labs.  Questions were answered to the patient's satisfaction.     [place]

## 2022-09-30 ENCOUNTER — Ambulatory Visit: Payer: Medicaid Other | Admitting: Urology

## 2022-10-05 ENCOUNTER — Ambulatory Visit
Admission: RE | Admit: 2022-10-05 | Discharge: 2022-10-05 | Disposition: A | Discharge status: Home / Self Care | Payer: Medicaid Other | Source: Ambulatory Visit | Attending: Urology | Admitting: Urology

## 2022-10-05 ENCOUNTER — Ambulatory Visit
Admission: RE | Admit: 2022-10-05 | Discharge: 2022-10-05 | Disposition: A | Discharge status: Home / Self Care | Payer: Medicaid Other | Attending: Urology | Admitting: Urology

## 2022-10-05 DIAGNOSIS — N201 Calculus of ureter: Secondary | ICD-10-CM | POA: Diagnosis present

## 2022-10-07 NOTE — Progress Notes (Deleted)
10/08/2022 9:45 AM   Jennifer Richmond 10/18/1985 [id number]  Referring provider: Remi Haggard, [place],  [place] [address]  Urological history: 1.  Nephrolithiasis -Still composition of 40% calcium oxalate monohydrate, 40% calcium phosphate current state and 20% calcium oxalate dihydrate -right URS (2017) -CT (08/2022) - 4 mm obstructing renal calculus within the mid left ureter  2. High risk hematuria -Smoker -TURBT (2017) - PUNLMP -CT (08/2022) -nephrolithiasis -No reports of gross hematuria -UA ***  No chief complaint on file.   HPI: Jennifer Richmond is a 37 y.o. female who presents today for status post left ESWL with Dr. Bernardo Heater on September 10, 2022.  Her postprocedural course was as expected and uneventful.  UA ***  KUB ***  PMH: Past Medical History:  Diagnosis Date   Abdominal pain    Anxiety    Asthma    Back pain    Bipolar disorder (HCC)    GERD (gastroesophageal reflux disease)    Gross hematuria    Kidney stones    Panic attacks    Panic attacks    PTSD (post-traumatic stress disorder)    Urinary hesitancy    UTI (urinary tract infection)     Surgical History: Past Surgical History:  Procedure Laterality Date   CYSTOSCOPY WITH BIOPSY  05/11/2016   Procedure: CYSTOSCOPY WITH BIOPSY;  Surgeon: [name], MD;  Location: [facility];  Service: Urology;;   DILATION AND [place] LITHOTRIPSY Left 09/10/2022   Procedure: EXTRACORPOREAL SHOCK WAVE LITHOTRIPSY (ESWL);  Surgeon: Abbie Sons, MD;  Location: [facility];  Service: Urology;  Laterality: Left;   LAPAROSCOPIC OVARIAN CYSTECTOMY     LAPAROSCOPIC TUBAL LIGATION Bilateral 06/05/2016   Procedure: LAPAROSCOPIC TUBAL LIGATION with bilateral salpingectomy;  Surgeon: Will Bonnet, MD;  Location: [facility];  Service: Gynecology;  Laterality: Bilateral;   URETEROSCOPY Right 05/11/2016   Procedure: URETEROSCOPY;  Surgeon: [name], MD;  Location: [facility];  Service: Urology;  Laterality: Right;    Home Medications:  Allergies as of 10/08/2022       Reactions   Clindamycin/lincomycin    Possibly CLINDAMYCIN vaginal gel. Patient uncertain of drug   Shrimp [shellfish Allergy]         Medication List        Accurate as of October 07, 2022  9:45 AM. If you have any questions, ask your nurse or doctor.          clonazePAM 1 MG tablet Commonly known as: KLONOPIN Take 1 mg by mouth 2 (two) times daily as needed for anxiety.   guaiFENesin-codeine 100-10 MG/5ML syrup Commonly known as: Cheratussin AC Take 5 mLs by mouth 3 (three) times daily as needed for cough.   ibuprofen 600 MG tablet Commonly known as: ADVIL Take 1 tablet (600 mg total) by mouth every 8 (eight) hours as needed.   ondansetron 4 MG disintegrating tablet Commonly known as: ZOFRAN-ODT Take 1 tablet (4 mg total) by mouth every 8 (eight) hours as needed.   oxyCODONE-acetaminophen 5-325 MG tablet Commonly known as: PERCOCET/ROXICET Take 1 tablet by mouth every 6 (six) hours as needed for severe pain.   pantoprazole 40 MG tablet Commonly known as: PROTONIX Take 40 mg by mouth daily.   tamsulosin 0.4 MG Caps capsule Commonly known as: FLOMAX Take 1 capsule (0.4 mg total) by mouth daily.        Allergies:  Allergies  Allergen Reactions  Clindamycin/Lincomycin     Possibly CLINDAMYCIN vaginal gel. Patient uncertain of drug   Shrimp [Shellfish Allergy]     Family History: Family History  Problem Relation Age of Onset   Diabetes Father    Kidney nephrosis Father    Seizures Mother    Cancer Maternal Grandfather 69       pancreatic and colon 15s   Cancer Paternal Grandmother        ovarian   Kidney cancer Neg Hx    Prostate cancer Neg Hx    Bladder Cancer Neg Hx     Social History:  reports that she has been smoking cigarettes. She has never used smokeless tobacco. She reports current alcohol use. She reports  current drug use. Drug: Marijuana.  ROS: Pertinent ROS in HPI  Physical Exam: There were no vitals taken for this visit.  Constitutional:  Well nourished. Alert and oriented, No acute distress. HEENT: [place] AT, moist mucus membranes.  Trachea midline, no masses. Cardiovascular: No clubbing, cyanosis, or edema. Respiratory: Normal respiratory effort, no increased work of breathing. GU: No CVA tenderness.  No bladder fullness or masses. Vulvovaginal atrophy w/ pallor, loss of rugae, introital retraction, excoriations.  Vulvar thinning, fusion of labia, clitoral hood retraction, prominent urethral meatus.   *** external genitalia, *** pubic hair distribution, no lesions.  Normal urethral meatus, no lesions, no prolapse, no discharge.   No urethral masses, tenderness and/or tenderness. No bladder fullness, tenderness or masses. *** vagina mucosa, *** estrogen effect, no discharge, no lesions, *** pelvic support, *** cystocele and *** rectocele noted.  No cervical motion tenderness.  Uterus is freely mobile and non-fixed.  No adnexal/parametria masses or tenderness noted.  Anus and perineum are without rashes or lesions.   ***  Neurologic: Grossly intact, no focal deficits, moving all 4 extremities. Psychiatric: Normal mood and affect.    Laboratory Data: Lab Results  Component Value Date   WBC 12.6 (H) 09/07/2022   HGB 14.2 09/07/2022   HCT 43.6 09/07/2022   MCV 92.4 09/07/2022   PLT 237 09/07/2022    Lab Results  Component Value Date   CREATININE 0.82 09/07/2022    Lab Results  Component Value Date   AST 21 09/07/2022   Lab Results  Component Value Date   ALT 12 09/07/2022    Urinalysis ***  I have reviewed the labs.   Pertinent Imaging: *** I have independently reviewed the films.    Assessment & Plan:  ***  1.  Left ureteral stone ***  2.  Microscopic hematuria ***  No follow-ups on file.  These notes generated with voice recognition software. I apologize for  typographical errors.  [place], [place] [address]  [place], [place] [address] [phone]

## 2022-10-08 ENCOUNTER — Ambulatory Visit: Payer: Medicaid Other | Admitting: Urology

## 2022-10-08 DIAGNOSIS — N201 Calculus of ureter: Secondary | ICD-10-CM

## 2022-10-08 DIAGNOSIS — R3129 Other microscopic hematuria: Secondary | ICD-10-CM

## 2022-10-23 ENCOUNTER — Ambulatory Visit
Admission: EM | Admit: 2022-10-23 | Discharge: 2022-10-23 | Disposition: A | Discharge status: Home / Self Care | Payer: Medicaid Other | Attending: Urgent Care | Admitting: Urgent Care

## 2022-10-23 DIAGNOSIS — B370 Candidal stomatitis: Secondary | ICD-10-CM

## 2022-10-23 DIAGNOSIS — B356 Tinea cruris: Secondary | ICD-10-CM | POA: Diagnosis present

## 2022-10-23 MED ORDER — NYSTATIN 100000 UNIT/ML MT SUSP: 500000.0000 [IU] | Freq: Four times a day (QID) | OROMUCOSAL | 0 refills | Status: AC

## 2022-10-23 MED ORDER — FLUCONAZOLE 150 MG PO TABS: 150.0000 mg | ORAL_TABLET | ORAL | 0 refills | Status: AC

## 2022-10-23 MED ORDER — CLOTRIMAZOLE 1 % EX CREA: TOPICAL_CREAM | CUTANEOUS | 0 refills | Status: AC

## 2022-10-23 MED ORDER — NYSTATIN 100000 UNIT/GM EX POWD: 1.0000 | Freq: Two times a day (BID) | CUTANEOUS | 1 refills | Status: DC

## 2022-10-23 NOTE — Discharge Instructions (Addendum)
Take the diflucan pill every 3 days for 2 doses.  Use the nystatin liquid 4 times daily. Swish, gargle, swallow.  Twice a day: -Apply a small amount of clotrimazole cream to the affected area around your vagina and rectum ("butt-hole").  -Rub the cream in completely and allow to dry completely. -Dry with blow-dryer on cool setting. -Apply nystatin powder.  Each time you urinate, apply gold bond medicated powder after patting yourself dry to ensure complete dryness.  Follow up here or with your primary care provider if your symptoms are worsening or not improving with treatment.

## 2022-10-23 NOTE — ED Provider Notes (Signed)
Jennifer Richmond    CSN: [id number] Arrival date & time: 10/23/22  1658      History   Chief Complaint Chief Complaint  Patient presents with   Pruritis    HPI Jennifer Richmond is a 37 y.o. female.   HPI  Presents with peri-rectal and peri-vaginal itching as well as reported "white coating" on her tongue starting 3 days ago.  She denies foul odor or discharge.  She states she was treated for UTI and kidney infection recently and taking antibiotics associated with nephrolithiasis.  Treated on 09/10/2022 with lithotripsy.   She states she used Diflucan to treat a vaginal yeast infection and using antifungal powder.  Endorses some improvement today but continues to have itching.  Past Medical History:  Diagnosis Date   Abdominal pain    Anxiety    Asthma    Back pain    Bipolar disorder (HCC)    GERD (gastroesophageal reflux disease)    Gross hematuria    Kidney stones    Panic attacks    Panic attacks    PTSD (post-traumatic stress disorder)    Urinary hesitancy    UTI (urinary tract infection)     Patient Active Problem List   Diagnosis Date Noted   Admission for sterilization 06/05/2016   Flank pain 03/05/2016   Nephrolithiasis 02/25/2016    Past Surgical History:  Procedure Laterality Date   CYSTOSCOPY WITH BIOPSY  05/11/2016   Procedure: CYSTOSCOPY WITH BIOPSY;  Surgeon: [name], MD;  Location: [facility];  Service: Urology;;   [place] LITHOTRIPSY Left 09/10/2022   Procedure: EXTRACORPOREAL SHOCK WAVE LITHOTRIPSY (ESWL);  Surgeon: Abbie Sons, MD;  Location: [facility];  Service: Urology;  Laterality: Left;   LAPAROSCOPIC OVARIAN CYSTECTOMY     LAPAROSCOPIC TUBAL LIGATION Bilateral 06/05/2016   Procedure: LAPAROSCOPIC TUBAL LIGATION with bilateral salpingectomy;  Surgeon: Will Bonnet, MD;  Location: [facility];  Service: Gynecology;  Laterality: Bilateral;   URETEROSCOPY Right 05/11/2016    Procedure: URETEROSCOPY;  Surgeon: [name], MD;  Location: [facility];  Service: Urology;  Laterality: Right;    OB History     Gravida  4   Para  2   Term  2   Preterm      AB  2   Living  2      SAB  1   IAB  1   Ectopic      Multiple  0   Live Births  2            Home Medications    Prior to Admission medications   Medication Sig Start Date End Date Taking? Authorizing Provider  clonazePAM (KLONOPIN) 1 MG tablet Take 1 mg by mouth 2 (two) times daily as needed for anxiety.    Yes [provider]  ibuprofen (ADVIL,MOTRIN) 600 MG tablet Take 1 tablet (600 mg total) by mouth every 8 (eight) hours as needed. 11/13/16  Yes Sable Feil, PA-C  pantoprazole (PROTONIX) 40 MG tablet Take 40 mg by mouth daily.   Yes [provider]  tamsulosin (FLOMAX) 0.4 MG CAPS capsule Take 1 capsule (0.4 mg total) by mouth daily. 09/07/22  Yes Cuthriell, Charline Bills, PA-C  guaiFENesin-codeine (CHERATUSSIN AC) 100-10 MG/5ML syrup Take 5 mLs by mouth 3 (three) times daily as needed for cough. Patient not taking: Reported on 09/10/2022 10/14/18   Lorin Picket, PA-C  ondansetron (ZOFRAN-ODT)  4 MG disintegrating tablet Take 1 tablet (4 mg total) by mouth every 8 (eight) hours as needed. 09/07/22   Cuthriell, Charline Bills, PA-C  oxyCODONE-acetaminophen (PERCOCET/ROXICET) 5-325 MG tablet Take 1 tablet by mouth every 6 (six) hours as needed for severe pain. 09/07/22   Cuthriell, Charline Bills, PA-C    Family History Family History  Problem Relation Age of Onset   Diabetes Father    Kidney nephrosis Father    Seizures Mother    Cancer Maternal Grandfather 75       pancreatic and colon 10s   Cancer Paternal Grandmother        ovarian   Kidney cancer Neg Hx    Prostate cancer Neg Hx    Bladder Cancer Neg Hx     Social History Social History   Tobacco Use   Smoking status: Every Day    Packs/day: 0.00    Years: 0.00    Total pack years: 0.00    Types:  Cigarettes   Smokeless tobacco: Never   Tobacco comments:    1-3 cigarettes per day  Vaping Use   Vaping Use: Never used  Substance Use Topics   Alcohol use: Yes    Comment: Occ   Drug use: Yes    Types: Marijuana    Comment: PT DENIES BUT HAD A + UDS FOR MARIJUANA IN 2013     Allergies   Clindamycin/lincomycin and Shrimp [shellfish allergy]   Review of Systems Review of Systems   Physical Exam Triage Vital Signs ED Triage Vitals  Enc Vitals Group     BP 10/23/22 1758 102/63     Pulse Rate 10/23/22 1758 84     Resp 10/23/22 1758 16     Temp 10/23/22 1758 98.8 F (37.1 C)     Temp Source 10/23/22 1758 Oral     SpO2 10/23/22 1758 98 %     Weight --      Height --      Head Circumference --      Peak Flow --      Pain Score 10/23/22 1814 0     Pain Loc --      Pain Edu? --      Excl. in [place]? --    No data found.  Updated Vital Signs BP 102/63 (BP Location: Left Arm)   Pulse 84   Temp 98.8 F (37.1 C) (Oral)   Resp 16   LMP 10/05/2022 (Approximate)   SpO2 98%   Visual Acuity Right Eye Distance:   Left Eye Distance:   Bilateral Distance:    Right Eye Near:   Left Eye Near:    Bilateral Near:     Physical Exam Vitals reviewed.  Constitutional:      Appearance: Normal appearance.  HENT:     Mouth/Throat:   Genitourinary:   Skin:    General: Skin is warm and dry.  Neurological:     General: No focal deficit present.     Mental Status: She is alert and oriented to person, place, and time.  Psychiatric:        Mood and Affect: Mood normal.        Behavior: Behavior normal.      UC Treatments / Results  Labs (all labs ordered are listed, but only abnormal results are displayed) Labs Reviewed - No data to display  EKG   Radiology No results found.  Procedures Procedures (including critical care time)  Medications Ordered in UC Medications -  No data to display  Initial Impression / Assessment and Plan / UC Course  I have  reviewed the triage vital signs and the nursing notes.  Pertinent labs & imaging results that were available during my care of the patient were reviewed by me and considered in my medical decision making (see chart for details).   Apparent wide spread fungal infection affecting her perineum (vaginal and peri-rectal) and oropharynx. Will treat perineum with clotrimazole cream and nystatin powder. Prescribing additional 2 doses of diflucan. Treating oral thrush with nystatin. Detailed treatment instructions provided.   Final Clinical Impressions(s) / UC Diagnoses   Final diagnoses:  None   Discharge Instructions   None    ED Prescriptions   None    PDMP not reviewed this encounter.   Rose Phi, [place] 10/23/22 1846

## 2022-10-23 NOTE — ED Triage Notes (Signed)
Pt states she is having itching around her anus and around vaginal area and white coating on her tongue that started 3 days ago, with no foul odor or no discharge. Pt states she had UTI recently and kidney infection so was recently on antibiotic and took diflucan to treat  if it was yeast infection yesterday and used antifungal powder, but states it is a little better today but still having itching.

## 2022-10-26 LAB — CERVICOVAGINAL ANCILLARY ONLY
Bacterial Vaginitis (gardnerella): NEGATIVE
Candida Glabrata: POSITIVE — AB
Candida Vaginitis: NEGATIVE
Chlamydia: NEGATIVE
Comment: NEGATIVE
Comment: NEGATIVE
Comment: NEGATIVE
Comment: NEGATIVE
Comment: NEGATIVE
Comment: NORMAL
Neisseria Gonorrhea: NEGATIVE
Trichomonas: NEGATIVE

## 2022-10-29 ENCOUNTER — Telehealth: Payer: Self-pay

## 2023-01-17 ENCOUNTER — Ambulatory Visit
Admission: EM | Admit: 2023-01-17 | Discharge: 2023-01-17 | Disposition: A | Discharge status: Home / Self Care | Payer: Medicaid Other | Attending: Urgent Care | Admitting: Urgent Care

## 2023-01-17 DIAGNOSIS — J029 Acute pharyngitis, unspecified: Secondary | ICD-10-CM | POA: Diagnosis not present

## 2023-01-17 LAB — POCT RAPID STREP A (OFFICE): Rapid Strep A Screen: NEGATIVE

## 2023-01-17 NOTE — Discharge Instructions (Addendum)
Follow up here or with your primary care provider if your symptoms are worsening or not improving.     

## 2023-01-17 NOTE — ED Provider Notes (Signed)
Jennifer Richmond    CSN: [id number] Arrival date & time: 01/17/23  1333      History   Chief Complaint No chief complaint on file.   HPI Jennifer Richmond is a 37 y.o. female.   HPI  Treated by provider.  Presents to urgent care with complaint of sore throat x 1 week. Endorses nasal congestion and hx of rhinorrhea. She says she is using nasal sprays for her nasal symptoms.  No relevant past medical history.  Past Medical History:  Diagnosis Date   Abdominal pain    Anxiety    Asthma    Back pain    Bipolar disorder (HCC)    GERD (gastroesophageal reflux disease)    Gross hematuria    Kidney stones    Panic attacks    Panic attacks    PTSD (post-traumatic stress disorder)    Urinary hesitancy    UTI (urinary tract infection)     Patient Active Problem List   Diagnosis Date Noted   Admission for sterilization 06/05/2016   Flank pain 03/05/2016   Nephrolithiasis 02/25/2016    Past Surgical History:  Procedure Laterality Date   CYSTOSCOPY WITH BIOPSY  05/11/2016   Procedure: CYSTOSCOPY WITH BIOPSY;  Surgeon: Vanna Scotland, MD;  Location: [facility];  Service: Urology;;   DILATION AND CURETTAGE OF UTERUS     EXTRACORPOREAL SHOCK WAVE LITHOTRIPSY Left 09/10/2022   Procedure: EXTRACORPOREAL SHOCK WAVE LITHOTRIPSY (ESWL);  Surgeon: Riki Altes, MD;  Location: [facility];  Service: Urology;  Laterality: Left;   LAPAROSCOPIC OVARIAN CYSTECTOMY     LAPAROSCOPIC TUBAL LIGATION Bilateral 06/05/2016   Procedure: LAPAROSCOPIC TUBAL LIGATION with bilateral salpingectomy;  Surgeon: Conard Novak, MD;  Location: [facility];  Service: Gynecology;  Laterality: Bilateral;   URETEROSCOPY Right 05/11/2016   Procedure: URETEROSCOPY;  Surgeon: Vanna Scotland, MD;  Location: [facility];  Service: Urology;  Laterality: Right;    OB History     Gravida  4   Para  2   Term  2   Preterm      AB  2   Living  2      SAB  1   IAB  1   Ectopic      Multiple  0    Live Births  2            Home Medications    Prior to Admission medications   Medication Sig Start Date End Date Taking? Authorizing Provider  clonazePAM (KLONOPIN) 1 MG tablet Take 1 mg by mouth 2 (two) times daily as needed for anxiety.     [provider]  clotrimazole (LOTRIMIN) 1 % cream Apply a small amount to affected areas 2 times daily. Allow to dry completely. 10/23/22   Cereniti Curb, Jeannett Senior, FNP  guaiFENesin-codeine (CHERATUSSIN AC) 100-10 MG/5ML syrup Take 5 mLs by mouth 3 (three) times daily as needed for cough. Patient not taking: Reported on 09/10/2022 10/14/18   Lutricia Feil, PA-C  ibuprofen (ADVIL,MOTRIN) 600 MG tablet Take 1 tablet (600 mg total) by mouth every 8 (eight) hours as needed. 11/13/16   Joni Reining, PA-C  nystatin (MYCOSTATIN/NYSTOP) powder Apply 1 Application topically 2 (two) times daily. 10/23/22   Airam Heidecker, Jeannett Senior, FNP  ondansetron (ZOFRAN-ODT) 4 MG disintegrating tablet Take 1 tablet (4 mg total) by mouth every 8 (eight) hours as needed. 09/07/22   Cuthriell, Delorise Royals, PA-C  oxyCODONE-acetaminophen (PERCOCET/ROXICET) 5-325 MG tablet Take 1 tablet by mouth every 6 (six)  hours as needed for severe pain. 09/07/22   Cuthriell, Delorise Royals, PA-C  pantoprazole (PROTONIX) 40 MG tablet Take 40 mg by mouth daily.    [provider]  tamsulosin (FLOMAX) 0.4 MG CAPS capsule Take 1 capsule (0.4 mg total) by mouth daily. 09/07/22   Cuthriell, Delorise Royals, PA-C    Family History Family History  Problem Relation Age of Onset   Diabetes Father    Kidney nephrosis Father    Seizures Mother    Cancer Maternal Grandfather 53       pancreatic and colon 62s   Cancer Paternal Grandmother        ovarian   Kidney cancer Neg Hx    Prostate cancer Neg Hx    Bladder Cancer Neg Hx     Social History Social History   Tobacco Use   Smoking status: Every Day    Packs/day: 0.00    Years: 0.00    Additional pack years: 0.00    Total pack years: 0.00     Types: Cigarettes   Smokeless tobacco: Never   Tobacco comments:    1-3 cigarettes per day  Vaping Use   Vaping Use: Never used  Substance Use Topics   Alcohol use: Yes    Comment: Occ   Drug use: Yes    Types: Marijuana    Comment: PT DENIES BUT HAD A + UDS FOR MARIJUANA IN 2013     Allergies   Clindamycin/lincomycin and Shrimp [shellfish allergy]   Review of Systems Review of Systems   Physical Exam Triage Vital Signs ED Triage Vitals  Enc Vitals Group     BP      Pulse      Resp      Temp      Temp src      SpO2      Weight      Height      Head Circumference      Peak Flow      Pain Score      Pain Loc      Pain Edu?      Excl. in [place]?    No data found.  Updated Vital Signs There were no vitals taken for this visit.  Visual Acuity Right Eye Distance:   Left Eye Distance:   Bilateral Distance:    Right Eye Near:   Left Eye Near:    Bilateral Near:     Physical Exam Vitals reviewed.  Constitutional:      Appearance: Normal appearance.  HENT:     Nose: Congestion present.     Mouth/Throat:     Pharynx: Posterior oropharyngeal erythema present. No oropharyngeal exudate.  Skin:    General: Skin is warm and dry.  Neurological:     General: No focal deficit present.     Mental Status: She is alert and oriented to person, place, and time.  Psychiatric:        Mood and Affect: Mood normal.        Behavior: Behavior normal.      UC Treatments / Results  Labs (all labs ordered are listed, but only abnormal results are displayed) Labs Reviewed - No data to display  EKG   Radiology No results found.  Procedures Procedures (including critical care time)  Medications Ordered in UC Medications - No data to display  Initial Impression / Assessment and Plan / UC Course  I have reviewed the triage vital signs and the nursing  notes.  Pertinent labs & imaging results that were available during my care of the patient were reviewed by me  and considered in my medical decision making (see chart for details).   Jennifer Richmond is a 37 y.o. female presenting with sore throat. Patient is afebrile without recent antipyretics, satting well on room air. Overall is well appearing though non-toxic, well hydrated, without respiratory distress.   Reviewed relevant chart history.   Patient's symptoms are consistent with an acute viral versus allergic process.  Most likely cause of her sore throat is postnasal drip.  Recommending supportive care and use of OTC medication for symptom control.  Offered nasal decongestants and other nasal sprays for treatment of her rhinorrhea and PND were offered but declined.  Counseled patient on potential for adverse effects with medications prescribed/recommended today, ER and return-to-clinic precautions discussed, patient verbalized understanding and agreement with care plan.  Final Clinical Impressions(s) / UC Diagnoses   Final diagnoses:  None   Discharge Instructions   None    ED Prescriptions   None    PDMP not reviewed this encounter.   Charma Igo, [place] 01/17/23 1545

## 2023-05-13 ENCOUNTER — Ambulatory Visit
Admission: EM | Admit: 2023-05-13 | Discharge: 2023-05-13 | Disposition: A | Discharge status: Home / Self Care | Payer: Medicaid Other | Attending: Family Medicine | Admitting: Family Medicine

## 2023-05-13 DIAGNOSIS — H1032 Unspecified acute conjunctivitis, left eye: Secondary | ICD-10-CM | POA: Diagnosis not present

## 2023-05-13 MED ORDER — POLYMYXIN B-TRIMETHOPRIM 10000-0.1 UNIT/ML-% OP SOLN: 2.0000 [drp] | OPHTHALMIC | 0 refills | Status: AC

## 2023-05-13 NOTE — ED Provider Notes (Signed)
[name]    CSN: [id number] Arrival date & time: 05/13/23  8295      History   Chief Complaint Chief Complaint  Patient presents with   Eye Problem    HPI ALMA BEITH is a 37 y.o. female.    Eye Problem Here for irritation and redness and drainage from her left eye.  Symptoms began yesterday.  She awoke to find her left eye matted shut this morning.  She was exposed to a coworker about 4 days ago who had a red swollen eye.  Last menstrual cycle was September 13  She is allergic to clindamycin  No new cough or congestion or fever.   Past Medical History:  Diagnosis Date   Abdominal pain    Anxiety    Asthma    Back pain    Bipolar disorder (HCC)    GERD (gastroesophageal reflux disease)    Gross hematuria    Kidney stones    Panic attacks    Panic attacks    PTSD (post-traumatic stress disorder)    Urinary hesitancy    UTI (urinary tract infection)     Patient Active Problem List   Diagnosis Date Noted   Admission for sterilization 06/05/2016   Flank pain 03/05/2016   Nephrolithiasis 02/25/2016    Past Surgical History:  Procedure Laterality Date   CYSTOSCOPY WITH BIOPSY  05/11/2016   Procedure: CYSTOSCOPY WITH BIOPSY;  Surgeon: Vanna Scotland, MD;  Location: [facility];  Service: Urology;;   DILATION AND CURETTAGE OF UTERUS     EXTRACORPOREAL SHOCK WAVE LITHOTRIPSY Left 09/10/2022   Procedure: EXTRACORPOREAL SHOCK WAVE LITHOTRIPSY (ESWL);  Surgeon: Riki Altes, MD;  Location: [facility];  Service: Urology;  Laterality: Left;   LAPAROSCOPIC OVARIAN CYSTECTOMY     LAPAROSCOPIC TUBAL LIGATION Bilateral 06/05/2016   Procedure: LAPAROSCOPIC TUBAL LIGATION with bilateral salpingectomy;  Surgeon: Conard Novak, MD;  Location: [facility];  Service: Gynecology;  Laterality: Bilateral;   URETEROSCOPY Right 05/11/2016   Procedure: URETEROSCOPY;  Surgeon: Vanna Scotland, MD;  Location: [facility];  Service: Urology;  Laterality: Right;    OB  History     Gravida  4   Para  2   Term  2   Preterm      AB  2   Living  2      SAB  1   IAB  1   Ectopic      Multiple  0   Live Births  2            Home Medications    Prior to Admission medications   Medication Sig Start Date End Date Taking? Authorizing Provider  trimethoprim-polymyxin b (POLYTRIM) ophthalmic solution Place 2 drops into the left eye every 4 (four) hours for 5 days. While awake 05/13/23 05/18/23 Yes Ira Dougher, Janace Aris, MD  clonazePAM (KLONOPIN) 1 MG tablet Take 1 mg by mouth 2 (two) times daily as needed for anxiety.     [provider]  clotrimazole (LOTRIMIN) 1 % cream Apply a small amount to affected areas 2 times daily. Allow to dry completely. 10/23/22   Immordino, Jeannett Senior, FNP  pantoprazole (PROTONIX) 40 MG tablet Take 40 mg by mouth daily.    [provider]    Family History Family History  Problem Relation Age of Onset   Diabetes Father    Kidney nephrosis Father    Seizures Mother    Cancer Maternal Grandfather 63  pancreatic and colon 60s   Cancer Paternal Grandmother        ovarian   Kidney cancer Neg Hx    Prostate cancer Neg Hx    Bladder Cancer Neg Hx     Social History Social History   Tobacco Use   Smoking status: Every Day    Current packs/day: 0.00    Types: Cigarettes   Smokeless tobacco: Never   Tobacco comments:    1-3 cigarettes per day  Vaping Use   Vaping status: Never Used  Substance Use Topics   Alcohol use: Yes    Comment: Occ   Drug use: Yes    Types: Marijuana    Comment: PT DENIES BUT HAD A + UDS FOR MARIJUANA IN 2013     Allergies   Clindamycin/lincomycin and Shrimp [shellfish allergy]   Review of Systems Review of Systems   Physical Exam Triage Vital Signs ED Triage Vitals  Encounter Vitals Group     BP 05/13/23 0824 91/65     Systolic BP Percentile --      Diastolic BP Percentile --      Pulse Rate 05/13/23 0824 81     Resp 05/13/23 0824 16     Temp  05/13/23 0824 98.6 F (37 C)     Temp src --      SpO2 05/13/23 0824 98 %     Weight 05/13/23 0822 105 lb (47.6 kg)     Height 05/13/23 0822 5\' 5"  (1.651 m)     Head Circumference --      Peak Flow --      Pain Score 05/13/23 0819 0     Pain Loc --      Pain Education --      Exclude from Growth Chart --    No data found.  Updated Vital Signs BP 91/65   Pulse 81   Temp 98.6 F (37 C)   Resp 16   Ht 5\' 5"  (1.651 m)   Wt 47.6 kg   LMP 05/07/2023   SpO2 98%   BMI 17.47 kg/m   Visual Acuity Right Eye Distance:   Left Eye Distance:   Bilateral Distance:    Right Eye Near:   Left Eye Near:    Bilateral Near:     Physical Exam Vitals reviewed.  Constitutional:      General: She is not in acute distress.    Appearance: She is not ill-appearing, toxic-appearing or diaphoretic.  HENT:     Mouth/Throat:     Mouth: Mucous membranes are moist.  Eyes:     Extraocular Movements: Extraocular movements intact.     Pupils: Pupils are equal, round, and reactive to light.     Comments: There is mild injection of the left conjunctiva.  The lids are not swollen.  There is a little bit of discharge at the inner canthus on the left  Skin:    Coloration: Skin is not pale.  Neurological:     General: No focal deficit present.     Mental Status: She is alert and oriented to person, place, and time.  Psychiatric:        Behavior: Behavior normal.      UC Treatments / Results  Labs (all labs ordered are listed, but only abnormal results are displayed) Labs Reviewed - No data to display  EKG   Radiology No results found.  Procedures Procedures (including critical care time)  Medications Ordered in UC Medications - No  data to display  Initial Impression / Assessment and Plan / UC Course  I have reviewed the triage vital signs and the nursing notes.  Pertinent labs & imaging results that were available during my care of the patient were reviewed by me and considered in  my medical decision making (see chart for details).        Polytrim ophthalmic solution is sent in to treat the conjunctivitis.  Cool compresses are also recommended. Final Clinical Impressions(s) / UC Diagnoses   Final diagnoses:  Acute conjunctivitis of left eye, unspecified acute conjunctivitis type     Discharge Instructions      Polytrim eyedrops--put 2 drops in the left eye every 4 hours while you are awake for 5 days.  Cool compresses can help how it feels       ED Prescriptions     Medication Sig Dispense Auth. Provider   trimethoprim-polymyxin b (POLYTRIM) ophthalmic solution Place 2 drops into the left eye every 4 (four) hours for 5 days. While awake 10 mL Thresea Doble, Janace Aris, MD      PDMP not reviewed this encounter.   Zenia Resides, MD 05/13/23 [id number]

## 2023-05-13 NOTE — ED Triage Notes (Signed)
Patient to Urgent Care with complaints of left sided eye drainage and irritation. Reports symptoms started yesterday afternoon. Woke up this morning with her eye matted shut.   Reports being exposed to pink eye on Saturday by a coworker.   Denies any vision changes.

## 2023-05-13 NOTE — Discharge Instructions (Signed)
Polytrim eyedrops--put 2 drops in the left eye every 4 hours while you are awake for 5 days.  Cool compresses can help how it feels

## 2023-07-06 ENCOUNTER — Ambulatory Visit
Admission: EM | Admit: 2023-07-06 | Discharge: 2023-07-06 | Disposition: A | Discharge status: Home / Self Care | Payer: Medicaid Other | Attending: Emergency Medicine | Admitting: Emergency Medicine

## 2023-07-06 DIAGNOSIS — B349 Viral infection, unspecified: Secondary | ICD-10-CM | POA: Diagnosis not present

## 2023-07-06 DIAGNOSIS — Z8709 Personal history of other diseases of the respiratory system: Secondary | ICD-10-CM | POA: Diagnosis not present

## 2023-07-06 LAB — POCT RAPID STREP A (OFFICE): Rapid Strep A Screen: NEGATIVE

## 2023-07-06 MED ORDER — ALBUTEROL SULFATE HFA 108 (90 BASE) MCG/ACT IN AERS: 1.0000 | INHALATION_SPRAY | Freq: Four times a day (QID) | RESPIRATORY_TRACT | 0 refills | Status: AC | PRN

## 2023-07-06 MED ORDER — BENZONATATE 100 MG PO CAPS: 100.0000 mg | ORAL_CAPSULE | Freq: Three times a day (TID) | ORAL | 0 refills | Status: AC | PRN

## 2023-07-06 NOTE — ED Triage Notes (Signed)
Patient to Urgent Care with complaints of sore throat/ deep cough/ nasal congestion.  Report symptoms started approx one week ago. Sore throat x2 days.   Fever earlier today of 101. Taking tylenol.

## 2023-07-06 NOTE — ED Provider Notes (Signed)
Jennifer Richmond    CSN: [id number] Arrival date & time: 07/06/23  1319      History   Chief Complaint Chief Complaint  Patient presents with   Sore Throat   Cough   Nasal Congestion    HPI Jennifer Richmond is a 37 y.o. female.  Patient presents with fever and sore throat x 2 days.  Tmax 101.  She reports congestion and cough x 1 week.  Treating with Tylenol; last taken 1000 this morning.  No rash, shortness of breath, chest pain, vomiting, diarrhea, or other symptoms.  Patient reports history of asthma but does not currently have an albuterol inhaler.  The history is provided by the patient and medical records.    Past Medical History:  Diagnosis Date   Abdominal pain    Anxiety    Asthma    Back pain    Bipolar disorder (HCC)    GERD (gastroesophageal reflux disease)    Gross hematuria    Kidney stones    Panic attacks    Panic attacks    PTSD (post-traumatic stress disorder)    Urinary hesitancy    UTI (urinary tract infection)     Patient Active Problem List   Diagnosis Date Noted   Admission for sterilization 06/05/2016   Flank pain 03/05/2016   Nephrolithiasis 02/25/2016    Past Surgical History:  Procedure Laterality Date   CYSTOSCOPY WITH BIOPSY  05/11/2016   Procedure: CYSTOSCOPY WITH BIOPSY;  Surgeon: Vanna Scotland, MD;  Location: [facility];  Service: Urology;;   DILATION AND CURETTAGE OF UTERUS     EXTRACORPOREAL SHOCK WAVE LITHOTRIPSY Left 09/10/2022   Procedure: EXTRACORPOREAL SHOCK WAVE LITHOTRIPSY (ESWL);  Surgeon: Riki Altes, MD;  Location: [facility];  Service: Urology;  Laterality: Left;   LAPAROSCOPIC OVARIAN CYSTECTOMY     LAPAROSCOPIC TUBAL LIGATION Bilateral 06/05/2016   Procedure: LAPAROSCOPIC TUBAL LIGATION with bilateral salpingectomy;  Surgeon: Conard Novak, MD;  Location: [facility];  Service: Gynecology;  Laterality: Bilateral;   URETEROSCOPY Right 05/11/2016   Procedure: URETEROSCOPY;  Surgeon: Vanna Scotland, MD;   Location: [facility];  Service: Urology;  Laterality: Right;    OB History     Gravida  4   Para  2   Term  2   Preterm      AB  2   Living  2      SAB  1   IAB  1   Ectopic      Multiple  0   Live Births  2            Home Medications    Prior to Admission medications   Medication Sig Start Date End Date Taking? Authorizing Provider  albuterol (VENTOLIN HFA) 108 (90 Base) MCG/ACT inhaler Inhale 1-2 puffs into the lungs every 6 (six) hours as needed. 07/06/23  Yes Mickie Bail, NP  benzonatate (TESSALON) 100 MG capsule Take 1 capsule (100 mg total) by mouth 3 (three) times daily as needed for cough. 07/06/23  Yes Mickie Bail, NP  clonazePAM (KLONOPIN) 1 MG tablet Take 1 mg by mouth 2 (two) times daily as needed for anxiety.     [provider]  clotrimazole (LOTRIMIN) 1 % cream Apply a small amount to affected areas 2 times daily. Allow to dry completely. 10/23/22   Immordino, Jeannett Senior, FNP  pantoprazole (PROTONIX) 40 MG tablet Take 40 mg by mouth daily. Patient not taking: Reported on 07/06/2023    [provider]    Family History Family History  Problem Relation Age of Onset   Diabetes Father    Kidney nephrosis Father    Seizures Mother    Cancer Maternal Grandfather 36       pancreatic and colon 31s   Cancer Paternal Grandmother        ovarian   Kidney cancer Neg Hx    Prostate cancer Neg Hx    Bladder Cancer Neg Hx     Social History Social History   Tobacco Use   Smoking status: Every Day    Current packs/day: 0.00    Types: Cigarettes   Smokeless tobacco: Never   Tobacco comments:    1-3 cigarettes per day  Vaping Use   Vaping status: Never Used  Substance Use Topics   Alcohol use: Yes    Comment: Occ   Drug use: Yes    Types: Marijuana    Comment: PT DENIES BUT HAD A + UDS FOR MARIJUANA IN 2013     Allergies   Clindamycin/lincomycin and Shrimp [shellfish allergy]   Review of Systems Review of Systems   Constitutional:  Positive for fever. Negative for chills.  HENT:  Positive for congestion and sore throat. Negative for ear pain.   Respiratory:  Positive for cough. Negative for shortness of breath and wheezing.   Cardiovascular:  Negative for chest pain and palpitations.  Gastrointestinal:  Negative for diarrhea and vomiting.  Skin:  Negative for color change and rash.     Physical Exam Triage Vital Signs ED Triage Vitals  Encounter Vitals Group     BP 07/06/23 1408 108/70     Systolic BP Percentile --      Diastolic BP Percentile --      Pulse Rate 07/06/23 1408 78     Resp 07/06/23 1408 18     Temp 07/06/23 1408 98.7 F (37.1 C)     Temp src --      SpO2 07/06/23 1408 98 %     Weight --      Height --      Head Circumference --      Peak Flow --      Pain Score 07/06/23 1357 2     Pain Loc --      Pain Education --      Exclude from Growth Chart --    No data found.  Updated Vital Signs BP 108/70   Pulse 78   Temp 98.7 F (37.1 C)   Resp 18   LMP 06/22/2023   SpO2 98%   Visual Acuity Right Eye Distance:   Left Eye Distance:   Bilateral Distance:    Right Eye Near:   Left Eye Near:    Bilateral Near:     Physical Exam Constitutional:      General: She is not in acute distress. HENT:     Right Ear: Tympanic membrane normal.     Left Ear: Tympanic membrane normal.     Nose: Nose normal.     Mouth/Throat:     Mouth: Mucous membranes are moist.     Pharynx: Oropharynx is clear.  Cardiovascular:     Rate and Rhythm: Normal rate and regular rhythm.     Heart sounds: Normal heart sounds.  Pulmonary:     Effort: Pulmonary effort is normal. No respiratory distress.     Breath sounds: Normal breath sounds. No wheezing.  Skin:    General: Skin is warm and  dry.  Neurological:     Mental Status: She is alert.      UC Treatments / Results  Labs (all labs ordered are listed, but only abnormal results are displayed) Labs Reviewed  POCT RAPID STREP A  (OFFICE)    EKG   Radiology No results found.  Procedures Procedures (including critical care time)  Medications Ordered in UC Medications - No data to display  Initial Impression / Assessment and Plan / UC Course  I have reviewed the triage vital signs and the nursing notes.  Pertinent labs & imaging results that were available during my care of the patient were reviewed by me and considered in my medical decision making (see chart for details).    Viral illness, history of asthma.  Afebrile and vital signs are stable.  Lungs are clear and O2 sat is 98% on room air.  Rapid strep negative.  Patient declines flu or COVID test.  Treating cough with Tessalon Perles.  Patient reports history of asthma and she does not currently have an albuterol inhaler.  Prescription for albuterol inhaler sent to pharmacy.  Tylenol or ibuprofen as needed for fever or discomfort.  Mucinex as needed for congestion.  Education provided on viral illness.  Instructed patient to follow up with her PCP if her symptoms are not improving.  She agrees to plan of care.   Final Clinical Impressions(s) / UC Diagnoses   Final diagnoses:  Viral illness  History of asthma     Discharge Instructions      Take the Tessalon Perles and use the albuterol inhaler as directed.    The strep test is negative.  Take Tylenol or ibuprofen as needed for fever or discomfort.  Take Mucinex as needed for congestion.    Follow-up with your primary care provider if your symptoms are not improving.      ED Prescriptions     Medication Sig Dispense Auth. Provider   benzonatate (TESSALON) 100 MG capsule Take 1 capsule (100 mg total) by mouth 3 (three) times daily as needed for cough. 21 capsule Mickie Bail, NP   albuterol (VENTOLIN HFA) 108 (90 Base) MCG/ACT inhaler Inhale 1-2 puffs into the lungs every 6 (six) hours as needed. 18 g Mickie Bail, NP      PDMP not reviewed this encounter.   Mickie Bail, NP 07/06/23  1434

## 2023-07-06 NOTE — Discharge Instructions (Addendum)
Take the [facility] and use the albuterol inhaler as directed.    The strep test is negative.  Take Tylenol or ibuprofen as needed for fever or discomfort.  Take Mucinex as needed for congestion.    Follow-up with your primary care provider if your symptoms are not improving.

## 2023-07-14 ENCOUNTER — Ambulatory Visit
Admission: EM | Admit: 2023-07-14 | Discharge: 2023-07-14 | Disposition: A | Discharge status: Home / Self Care | Payer: Medicaid Other | Attending: Emergency Medicine | Admitting: Emergency Medicine

## 2023-07-14 DIAGNOSIS — H6692 Otitis media, unspecified, left ear: Secondary | ICD-10-CM

## 2023-07-14 DIAGNOSIS — J0100 Acute maxillary sinusitis, unspecified: Secondary | ICD-10-CM | POA: Diagnosis not present

## 2023-07-14 MED ORDER — AMOXICILLIN-POT CLAVULANATE 875-125 MG PO TABS: 1.0000 | ORAL_TABLET | Freq: Two times a day (BID) | ORAL | 0 refills | Status: AC

## 2023-07-14 MED ORDER — PREDNISONE 10 MG (21) PO TBPK: ORAL_TABLET | Freq: Every day | ORAL | 0 refills | Status: AC

## 2023-07-14 NOTE — Discharge Instructions (Addendum)
Take the Augmentin and prednisone as directed.  Continue to use your albuterol inhaler as directed.  Follow-up with your primary care provider.

## 2023-07-14 NOTE — ED Triage Notes (Signed)
Patient to Urgent Care with complaints of ear pain/ sore throat/ cough/ nasal congestion/ headaches. Poor taste and smell.  Reports symptoms started over two weeks ago. Feels as though her symptoms are worsening. Denies any known recent fevers.  Meds:Mucinex/ Dayquil/ airbornes.   Negative Covid test at home last night.

## 2023-07-14 NOTE — ED Provider Notes (Signed)
[name]    CSN: [id number] Arrival date & time: 07/14/23  4098      History   Chief Complaint Chief Complaint  Patient presents with   Otalgia   Sore Throat   Headache   Cough    HPI Jennifer Richmond is a 37 y.o. female.  Patient presents with ongoing ear pain, sore throat, congestion, postnasal drip, cough, headache x 2 weeks.  No fever, shortness of breath, vomiting, diarrhea, or other symptoms.  She has been treating her symptoms with Mucinex and DayQuil.  Negative COVID test at home.  Patient was seen here on 07/06/2023; diagnosed with viral illness; treated with albuterol inhaler and Tessalon Perles.  The history is provided by the patient and medical records.    Past Medical History:  Diagnosis Date   Abdominal pain    Anxiety    Asthma    Back pain    Bipolar disorder (HCC)    GERD (gastroesophageal reflux disease)    Gross hematuria    Kidney stones    Panic attacks    Panic attacks    PTSD (post-traumatic stress disorder)    Urinary hesitancy    UTI (urinary tract infection)     Patient Active Problem List   Diagnosis Date Noted   Admission for sterilization 06/05/2016   Flank pain 03/05/2016   Nephrolithiasis 02/25/2016    Past Surgical History:  Procedure Laterality Date   CYSTOSCOPY WITH BIOPSY  05/11/2016   Procedure: CYSTOSCOPY WITH BIOPSY;  Surgeon: Vanna Scotland, MD;  Location: [facility];  Service: Urology;;   DILATION AND CURETTAGE OF UTERUS     EXTRACORPOREAL SHOCK WAVE LITHOTRIPSY Left 09/10/2022   Procedure: EXTRACORPOREAL SHOCK WAVE LITHOTRIPSY (ESWL);  Surgeon: Riki Altes, MD;  Location: [facility];  Service: Urology;  Laterality: Left;   LAPAROSCOPIC OVARIAN CYSTECTOMY     LAPAROSCOPIC TUBAL LIGATION Bilateral 06/05/2016   Procedure: LAPAROSCOPIC TUBAL LIGATION with bilateral salpingectomy;  Surgeon: Conard Novak, MD;  Location: [facility];  Service: Gynecology;  Laterality: Bilateral;   URETEROSCOPY Right 05/11/2016    Procedure: URETEROSCOPY;  Surgeon: Vanna Scotland, MD;  Location: [facility];  Service: Urology;  Laterality: Right;    OB History     Gravida  4   Para  2   Term  2   Preterm      AB  2   Living  2      SAB  1   IAB  1   Ectopic      Multiple  0   Live Births  2            Home Medications    Prior to Admission medications   Medication Sig Start Date End Date Taking? Authorizing Provider  amoxicillin-clavulanate (AUGMENTIN) 875-125 MG tablet Take 1 tablet by mouth every 12 (twelve) hours for 10 days. 07/14/23 07/24/23 Yes Mickie Bail, NP  predniSONE (STERAPRED UNI-PAK 21 TAB) 10 MG (21) TBPK tablet Take by mouth daily. As directed 07/14/23  Yes Mickie Bail, NP  albuterol (VENTOLIN HFA) 108 (90 Base) MCG/ACT inhaler Inhale 1-2 puffs into the lungs every 6 (six) hours as needed. 07/06/23   Mickie Bail, NP  benzonatate (TESSALON) 100 MG capsule Take 1 capsule (100 mg total) by mouth 3 (three) times daily as needed for cough. Patient not taking: Reported on 07/14/2023 07/06/23   Mickie Bail, NP  clonazePAM (KLONOPIN) 1 MG tablet Take 1 mg by mouth 2 (two)  times daily as needed for anxiety.     [provider]  clotrimazole (LOTRIMIN) 1 % cream Apply a small amount to affected areas 2 times daily. Allow to dry completely. 10/23/22   Immordino, Jeannett Senior, FNP  pantoprazole (PROTONIX) 40 MG tablet Take 40 mg by mouth daily. Patient not taking: Reported on 07/06/2023    [provider]    Family History Family History  Problem Relation Age of Onset   Diabetes Father    Kidney nephrosis Father    Seizures Mother    Cancer Maternal Grandfather 17       pancreatic and colon 68s   Cancer Paternal Grandmother        ovarian   Kidney cancer Neg Hx    Prostate cancer Neg Hx    Bladder Cancer Neg Hx     Social History Social History   Tobacco Use   Smoking status: Every Day    Current packs/day: 0.00    Types: Cigarettes   Smokeless  tobacco: Never   Tobacco comments:    1-3 cigarettes per day  Vaping Use   Vaping status: Never Used  Substance Use Topics   Alcohol use: Yes    Comment: Occ   Drug use: Yes    Types: Marijuana    Comment: PT DENIES BUT HAD A + UDS FOR MARIJUANA IN 2013     Allergies   Clindamycin/lincomycin and Shrimp [shellfish allergy]   Review of Systems Review of Systems  Constitutional:  Negative for chills and fever.  HENT:  Positive for congestion, ear pain, postnasal drip, rhinorrhea and sore throat.   Respiratory:  Positive for cough. Negative for shortness of breath.   Cardiovascular:  Negative for chest pain and palpitations.  Gastrointestinal:  Negative for diarrhea and vomiting.     Physical Exam Triage Vital Signs ED Triage Vitals  Encounter Vitals Group     BP 07/14/23 0841 117/75     Systolic BP Percentile --      Diastolic BP Percentile --      Pulse Rate 07/14/23 0841 87     Resp 07/14/23 0841 18     Temp 07/14/23 0841 98.4 F (36.9 C)     Temp src --      SpO2 07/14/23 0841 98 %     Weight --      Height --      Head Circumference --      Peak Flow --      Pain Score 07/14/23 0838 5     Pain Loc --      Pain Education --      Exclude from Growth Chart --    No data found.  Updated Vital Signs BP 117/75   Pulse 87   Temp 98.4 F (36.9 C)   Resp 18   LMP 07/14/2023   SpO2 98%   Visual Acuity Right Eye Distance:   Left Eye Distance:   Bilateral Distance:    Right Eye Near:   Left Eye Near:    Bilateral Near:     Physical Exam Vitals and nursing note reviewed.  Constitutional:      General: She is not in acute distress.    Appearance: She is well-developed.  HENT:     Right Ear: Tympanic membrane normal.     Left Ear: Tympanic membrane is erythematous.     Nose: Congestion and rhinorrhea present.     Mouth/Throat:     Mouth: Mucous membranes are  moist.     Pharynx: Posterior oropharyngeal erythema present.  Cardiovascular:     Rate and  Rhythm: Normal rate and regular rhythm.     Heart sounds: Normal heart sounds.  Pulmonary:     Effort: Pulmonary effort is normal. No respiratory distress.     Breath sounds: Normal breath sounds.  Musculoskeletal:     Cervical back: Neck supple.  Skin:    General: Skin is warm and dry.  Neurological:     Mental Status: She is alert.      UC Treatments / Results  Labs (all labs ordered are listed, but only abnormal results are displayed) Labs Reviewed - No data to display  EKG   Radiology No results found.  Procedures Procedures (including critical care time)  Medications Ordered in UC Medications - No data to display  Initial Impression / Assessment and Plan / UC Course  I have reviewed the triage vital signs and the nursing notes.  Pertinent labs & imaging results that were available during my care of the patient were reviewed by me and considered in my medical decision making (see chart for details).    Acute sinusitis, left otitis media.  Treating today with Augmentin and prednisone taper.  Instructed patient to continue using her albuterol inhaler as needed.  Her lungs are clear at this time and O2 sat is 98% on room air.  Instructed her to follow-up with her PCP.  Education provided on sinus infection and otitis media.  Patient agrees to plan of care.  Final Clinical Impressions(s) / UC Diagnoses   Final diagnoses:  Acute non-recurrent maxillary sinusitis  Left otitis media, unspecified otitis media type     Discharge Instructions      Take the Augmentin and prednisone as directed.  Continue to use your albuterol inhaler as directed.  Follow-up with your primary care provider.     ED Prescriptions     Medication Sig Dispense Auth. Provider   amoxicillin-clavulanate (AUGMENTIN) 875-125 MG tablet Take 1 tablet by mouth every 12 (twelve) hours for 10 days. 20 tablet Mickie Bail, NP   predniSONE (STERAPRED UNI-PAK 21 TAB) 10 MG (21) TBPK tablet Take by  mouth daily. As directed 21 tablet Mickie Bail, NP      PDMP not reviewed this encounter.   Mickie Bail, NP 07/14/23 [id number]

## 2023-08-06 ENCOUNTER — Ambulatory Visit
Admission: EM | Admit: 2023-08-06 | Discharge: 2023-08-06 | Disposition: A | Discharge status: Home / Self Care | Payer: Medicaid Other | Attending: Emergency Medicine | Admitting: Emergency Medicine

## 2023-08-06 DIAGNOSIS — J069 Acute upper respiratory infection, unspecified: Secondary | ICD-10-CM | POA: Diagnosis present

## 2023-08-06 DIAGNOSIS — N898 Other specified noninflammatory disorders of vagina: Secondary | ICD-10-CM | POA: Diagnosis present

## 2023-08-06 NOTE — ED Provider Notes (Signed)
Jennifer Richmond    CSN: [id number] Arrival date & time: 08/06/23  1509      History   Chief Complaint Chief Complaint  Patient presents with   Otalgia   Cough   Nasal Congestion    HPI Jennifer Richmond is a 37 y.o. female.  Patient presents with 3-day history of ear pain.  She reports ongoing runny nose, congestion, cough.  No OTC medications today.  No fever or shortness of breath.  Patient also presents with vaginal itching x 1 week.  No treatments attempted.  No abdominal pain, dysuria, hematuria, pelvic pain.  Patient was seen here on 07/06/2023; diagnosed with viral illness, history of asthma; treated with albuterol inhaler and Tessalon Perles.  She was seen here again on 07/14/2023; diagnosed with acute sinusitis and left otitis media; treated with Augmentin and prednisone.  The history is provided by the patient and medical records.    Past Medical History:  Diagnosis Date   Abdominal pain    Anxiety    Asthma    Back pain    Bipolar disorder (HCC)    GERD (gastroesophageal reflux disease)    Gross hematuria    Kidney stones    Panic attacks    Panic attacks    PTSD (post-traumatic stress disorder)    Urinary hesitancy    UTI (urinary tract infection)     Patient Active Problem List   Diagnosis Date Noted   Admission for sterilization 06/05/2016   Flank pain 03/05/2016   Nephrolithiasis 02/25/2016    Past Surgical History:  Procedure Laterality Date   CYSTOSCOPY WITH BIOPSY  05/11/2016   Procedure: CYSTOSCOPY WITH BIOPSY;  Surgeon: Vanna Scotland, MD;  Location: [facility];  Service: Urology;;   DILATION AND CURETTAGE OF UTERUS     EXTRACORPOREAL SHOCK WAVE LITHOTRIPSY Left 09/10/2022   Procedure: EXTRACORPOREAL SHOCK WAVE LITHOTRIPSY (ESWL);  Surgeon: Riki Altes, MD;  Location: [facility];  Service: Urology;  Laterality: Left;   LAPAROSCOPIC OVARIAN CYSTECTOMY     LAPAROSCOPIC TUBAL LIGATION Bilateral 06/05/2016   Procedure: LAPAROSCOPIC TUBAL  LIGATION with bilateral salpingectomy;  Surgeon: Conard Novak, MD;  Location: [facility];  Service: Gynecology;  Laterality: Bilateral;   URETEROSCOPY Right 05/11/2016   Procedure: URETEROSCOPY;  Surgeon: Vanna Scotland, MD;  Location: [facility];  Service: Urology;  Laterality: Right;    OB History     Gravida  4   Para  2   Term  2   Preterm      AB  2   Living  2      SAB  1   IAB  1   Ectopic      Multiple  0   Live Births  2            Home Medications    Prior to Admission medications   Medication Sig Start Date End Date Taking? Authorizing Provider  albuterol (VENTOLIN HFA) 108 (90 Base) MCG/ACT inhaler Inhale 1-2 puffs into the lungs every 6 (six) hours as needed. 07/06/23   Mickie Bail, NP  benzonatate (TESSALON) 100 MG capsule Take 1 capsule (100 mg total) by mouth 3 (three) times daily as needed for cough. Patient not taking: Reported on 07/14/2023 07/06/23   Mickie Bail, NP  clonazePAM (KLONOPIN) 1 MG tablet Take 1 mg by mouth 2 (two) times daily as needed for anxiety.     [provider]  clotrimazole (LOTRIMIN) 1 % cream Apply a small  amount to affected areas 2 times daily. Allow to dry completely. 10/23/22   Immordino, Jeannett Senior, FNP  pantoprazole (PROTONIX) 40 MG tablet Take 40 mg by mouth daily. Patient not taking: Reported on 07/06/2023    [provider]  predniSONE (STERAPRED UNI-PAK 21 TAB) 10 MG (21) TBPK tablet Take by mouth daily. As directed Patient not taking: Reported on 08/06/2023 07/14/23   Mickie Bail, NP    Family History Family History  Problem Relation Age of Onset   Diabetes Father    Kidney nephrosis Father    Seizures Mother    Cancer Maternal Grandfather 54       pancreatic and colon 51s   Cancer Paternal Grandmother        ovarian   Kidney cancer Neg Hx    Prostate cancer Neg Hx    Bladder Cancer Neg Hx     Social History Social History   Tobacco Use   Smoking status: Every Day    Current  packs/day: 0.00    Types: Cigarettes   Smokeless tobacco: Never   Tobacco comments:    1-3 cigarettes per day  Vaping Use   Vaping status: Never Used  Substance Use Topics   Alcohol use: Yes    Comment: Occ   Drug use: Yes    Types: Marijuana    Comment: PT DENIES BUT HAD A + UDS FOR MARIJUANA IN 2013     Allergies   Clindamycin/lincomycin and Shrimp [shellfish allergy]   Review of Systems Review of Systems  Constitutional:  Negative for chills and fever.  HENT:  Positive for congestion, ear pain and rhinorrhea. Negative for ear discharge and sore throat.   Respiratory:  Positive for cough. Negative for shortness of breath.   Gastrointestinal:  Negative for abdominal pain, nausea and vomiting.  Genitourinary:  Positive for vaginal discharge. Negative for dysuria, hematuria and pelvic pain.     Physical Exam Triage Vital Signs ED Triage Vitals  Encounter Vitals Group     BP 08/06/23 1545 111/72     Systolic BP Percentile --      Diastolic BP Percentile --      Pulse Rate 08/06/23 1545 74     Resp 08/06/23 1545 18     Temp 08/06/23 1545 97.8 F (36.6 C)     Temp src --      SpO2 08/06/23 1545 98 %     Weight --      Height --      Head Circumference --      Peak Flow --      Pain Score 08/06/23 1544 2     Pain Loc --      Pain Education --      Exclude from Growth Chart --    No data found.  Updated Vital Signs BP 111/72   Pulse 74   Temp 97.8 F (36.6 C)   Resp 18   LMP 07/14/2023   SpO2 98%   Visual Acuity Right Eye Distance:   Left Eye Distance:   Bilateral Distance:    Right Eye Near:   Left Eye Near:    Bilateral Near:     Physical Exam Constitutional:      General: She is not in acute distress. HENT:     Right Ear: Tympanic membrane normal.     Left Ear: Tympanic membrane normal.     Nose: Nose normal.     Mouth/Throat:     Mouth: Mucous membranes  are moist.     Pharynx: Oropharynx is clear.     Comments: Clear PND.   Cardiovascular:     Rate and Rhythm: Normal rate and regular rhythm.     Heart sounds: Normal heart sounds.  Pulmonary:     Effort: Pulmonary effort is normal. No respiratory distress.     Breath sounds: Normal breath sounds.  Skin:    General: Skin is warm and dry.  Neurological:     Mental Status: She is alert.      UC Treatments / Results  Labs (all labs ordered are listed, but only abnormal results are displayed) Labs Reviewed  CERVICOVAGINAL ANCILLARY ONLY    EKG   Radiology No results found.  Procedures Procedures (including critical care time)  Medications Ordered in UC Medications - No data to display  Initial Impression / Assessment and Plan / UC Course  I have reviewed the triage vital signs and the nursing notes.  Pertinent labs & imaging results that were available during my care of the patient were reviewed by me and considered in my medical decision making (see chart for details).    Viral URI.  Vaginal itching.  Afebrile and vital signs are stable.  Lungs are clear and O2 sat is 98% on room air.  Discussed symptomatic treatment including Zyrtec.  Instructed patient to follow-up with her PCP next week.  Patient obtained vaginal swab for testing.  Discussed that we will call her if the test shows the need for treatment.  She agrees to plan of care.  Final Clinical Impressions(s) / UC Diagnoses   Final diagnoses:  Viral URI  Vaginal itching     Discharge Instructions      Take Zyrtec as directed.  Follow-up with your primary care provider next week.    Your vaginal tests are pending.  We will call you if they show the need for treatment.     ED Prescriptions   None    PDMP not reviewed this encounter.   Mickie Bail, NP 08/06/23 [phone]

## 2023-08-06 NOTE — ED Triage Notes (Signed)
Patient to Urgent Care with complaints of right sided ear pain/ cough/ runny nose/ nasal congestion. Denies any known fevers.   Reports symptoms started three days ago. Recurrent illnesses since 10/31.   Also reports vaginal itching- reports concerns about a yeast infection. Reports symptoms x1 week.

## 2023-08-06 NOTE — Discharge Instructions (Addendum)
Take Zyrtec as directed.  Follow-up with your primary care provider next week.    Your vaginal tests are pending.  We will call you if they show the need for treatment.

## 2023-08-09 LAB — CERVICOVAGINAL ANCILLARY ONLY
Bacterial Vaginitis (gardnerella): NEGATIVE
Candida Glabrata: POSITIVE — AB
Candida Vaginitis: NEGATIVE
Comment: NEGATIVE
Comment: NEGATIVE
Comment: NEGATIVE

## 2023-08-11 ENCOUNTER — Telehealth: Payer: Self-pay | Admitting: Emergency Medicine

## 2023-08-11 MED ORDER — FLUCONAZOLE 150 MG PO TABS: 150.0000 mg | ORAL_TABLET | Freq: Every day | ORAL | 0 refills | Status: AC

## 2023-08-11 NOTE — Telephone Encounter (Signed)
Telephone call to patient to discuss positive test result for candidal vaginitis.  Diflucan sent to her pharmacy.  Left message for her to call back.

## 2023-10-11 ENCOUNTER — Encounter: Payer: Self-pay | Admitting: Emergency Medicine

## 2023-10-11 ENCOUNTER — Ambulatory Visit
Admission: EM | Admit: 2023-10-11 | Discharge: 2023-10-11 | Disposition: A | Discharge status: Home / Self Care | Payer: Medicaid Other | Attending: Emergency Medicine | Admitting: Emergency Medicine

## 2023-10-11 DIAGNOSIS — N898 Other specified noninflammatory disorders of vagina: Secondary | ICD-10-CM | POA: Insufficient documentation

## 2023-10-11 DIAGNOSIS — R300 Dysuria: Secondary | ICD-10-CM | POA: Diagnosis not present

## 2023-10-11 DIAGNOSIS — R1030 Lower abdominal pain, unspecified: Secondary | ICD-10-CM | POA: Diagnosis present

## 2023-10-11 LAB — POCT URINALYSIS DIP (MANUAL ENTRY)
Bilirubin, UA: NEGATIVE
Glucose, UA: NEGATIVE mg/dL
Ketones, POC UA: NEGATIVE mg/dL
Leukocytes, UA: NEGATIVE
Nitrite, UA: NEGATIVE
Protein Ur, POC: NEGATIVE mg/dL
Spec Grav, UA: 1.025 (ref 1.010–1.025)
Urobilinogen, UA: 0.2 U/dL
pH, UA: 7 (ref 5.0–8.0)

## 2023-10-11 LAB — POCT URINE PREGNANCY: Preg Test, Ur: NEGATIVE

## 2023-10-11 NOTE — Discharge Instructions (Addendum)
Your urine does not show signs of infection.  Your vaginal tests are pending.    Follow up with your primary care provider tomorrow.  Go to the emergency department if you have worsening symptoms.

## 2023-10-11 NOTE — ED Triage Notes (Signed)
 Provider triage

## 2023-10-11 NOTE — ED Provider Notes (Signed)
Jennifer Richmond    CSN: [id number] Arrival date & time: 10/11/23  1433      History   Chief Complaint No chief complaint on file.   HPI Jennifer Richmond is a 38 y.o. female.  Patient presents with 3-day history of dysuria, urinary frequency, lower abdominal pain mostly on the left side.  She also reports small amount of vaginal discharge.  She denies fever, chills, hematuria, flank pain, nausea, vomiting, diarrhea, constipation, pelvic pain.  No OTC medications today.  The history is provided by the patient and medical records.    Past Medical History:  Diagnosis Date   Abdominal pain    Anxiety    Asthma    Back pain    Bipolar disorder (HCC)    GERD (gastroesophageal reflux disease)    Gross hematuria    Kidney stones    Panic attacks    Panic attacks    PTSD (post-traumatic stress disorder)    Urinary hesitancy    UTI (urinary tract infection)     Patient Active Problem List   Diagnosis Date Noted   Admission for sterilization 06/05/2016   Flank pain 03/05/2016   Nephrolithiasis 02/25/2016    Past Surgical History:  Procedure Laterality Date   CYSTOSCOPY WITH BIOPSY  05/11/2016   Procedure: CYSTOSCOPY WITH BIOPSY;  Surgeon: Vanna Scotland, MD;  Location: [facility];  Service: Urology;;   DILATION AND CURETTAGE OF UTERUS     EXTRACORPOREAL SHOCK WAVE LITHOTRIPSY Left 09/10/2022   Procedure: EXTRACORPOREAL SHOCK WAVE LITHOTRIPSY (ESWL);  Surgeon: Riki Altes, MD;  Location: [facility];  Service: Urology;  Laterality: Left;   LAPAROSCOPIC OVARIAN CYSTECTOMY     LAPAROSCOPIC TUBAL LIGATION Bilateral 06/05/2016   Procedure: LAPAROSCOPIC TUBAL LIGATION with bilateral salpingectomy;  Surgeon: Conard Novak, MD;  Location: [facility];  Service: Gynecology;  Laterality: Bilateral;   URETEROSCOPY Right 05/11/2016   Procedure: URETEROSCOPY;  Surgeon: Vanna Scotland, MD;  Location: [facility];  Service: Urology;  Laterality: Right;    OB History     Gravida  4    Para  2   Term  2   Preterm      AB  2   Living  2      SAB  1   IAB  1   Ectopic      Multiple  0   Live Births  2            Home Medications    Prior to Admission medications   Medication Sig Start Date End Date Taking? Authorizing Provider  albuterol (VENTOLIN HFA) 108 (90 Base) MCG/ACT inhaler Inhale 1-2 puffs into the lungs every 6 (six) hours as needed. 07/06/23   Mickie Bail, NP  benzonatate (TESSALON) 100 MG capsule Take 1 capsule (100 mg total) by mouth 3 (three) times daily as needed for cough. Patient not taking: Reported on 07/14/2023 07/06/23   Mickie Bail, NP  clonazePAM (KLONOPIN) 1 MG tablet Take 1 mg by mouth 2 (two) times daily as needed for anxiety.     [provider]  clotrimazole (LOTRIMIN) 1 % cream Apply a small amount to affected areas 2 times daily. Allow to dry completely. 10/23/22   Immordino, Jeannett Senior, FNP  fluconazole (DIFLUCAN) 150 MG tablet Take 1 tablet (150 mg total) by mouth daily. Take one tablet today.  May repeat in 3 days. 08/11/23   Mickie Bail, NP  pantoprazole (PROTONIX) 40 MG tablet Take 40 mg by  mouth daily. Patient not taking: Reported on 07/06/2023    [provider]  predniSONE (STERAPRED UNI-PAK 21 TAB) 10 MG (21) TBPK tablet Take by mouth daily. As directed Patient not taking: Reported on 08/06/2023 07/14/23   Mickie Bail, NP    Family History Family History  Problem Relation Age of Onset   Diabetes Father    Kidney nephrosis Father    Seizures Mother    Cancer Maternal Grandfather 12       pancreatic and colon 107s   Cancer Paternal Grandmother        ovarian   Kidney cancer Neg Hx    Prostate cancer Neg Hx    Bladder Cancer Neg Hx     Social History Social History   Tobacco Use   Smoking status: Every Day    Current packs/day: 0.00    Types: Cigarettes   Smokeless tobacco: Never   Tobacco comments:    1-3 cigarettes per day  Vaping Use   Vaping status: Never Used  Substance  Use Topics   Alcohol use: Yes    Comment: Occ   Drug use: Yes    Types: Marijuana    Comment: PT DENIES BUT HAD A + UDS FOR MARIJUANA IN 2013     Allergies   Clindamycin/lincomycin and Shrimp [shellfish allergy]   Review of Systems Review of Systems  Constitutional:  Negative for chills and fever.  Gastrointestinal:  Positive for abdominal pain. Negative for blood in stool, constipation, diarrhea, nausea and vomiting.  Genitourinary:  Positive for dyspareunia, frequency and vaginal discharge. Negative for flank pain, hematuria and pelvic pain.     Physical Exam Triage Vital Signs ED Triage Vitals  Encounter Vitals Group     BP      Systolic BP Percentile      Diastolic BP Percentile      Pulse      Resp      Temp      Temp src      SpO2      Weight      Height      Head Circumference      Peak Flow      Pain Score      Pain Loc      Pain Education      Exclude from Growth Chart    No data found.  Updated Vital Signs BP 99/67   Pulse 99   Temp 99 F (37.2 C)   Resp 18   SpO2 97%   Visual Acuity Right Eye Distance:   Left Eye Distance:   Bilateral Distance:    Right Eye Near:   Left Eye Near:    Bilateral Near:     Physical Exam Constitutional:      General: She is not in acute distress. HENT:     Mouth/Throat:     Mouth: Mucous membranes are moist.  Cardiovascular:     Rate and Rhythm: Normal rate and regular rhythm.     Heart sounds: Normal heart sounds.  Pulmonary:     Effort: Pulmonary effort is normal. No respiratory distress.     Breath sounds: Normal breath sounds.  Abdominal:     General: Bowel sounds are normal.     Palpations: Abdomen is soft.     Tenderness: There is no abdominal tenderness. There is no right CVA tenderness, left CVA tenderness, guarding or rebound.  Neurological:     Mental Status: She is alert.  UC Treatments / Results  Labs (all labs ordered are listed, but only abnormal results are displayed) Labs  Reviewed  POCT URINALYSIS DIP (MANUAL ENTRY) - Abnormal; Notable for the following components:      Result Value   Clarity, UA turbid (*)    Blood, UA small (*)    All other components within normal limits  POCT URINE PREGNANCY  CERVICOVAGINAL ANCILLARY ONLY    EKG   Radiology No results found.  Procedures Procedures (including critical care time)  Medications Ordered in UC Medications - No data to display  Initial Impression / Assessment and Plan / UC Course  I have reviewed the triage vital signs and the nursing notes.  Pertinent labs & imaging results that were available during my care of the patient were reviewed by me and considered in my medical decision making (see chart for details).    Lower abdominal pain, dysuria, vaginal discharge.  Patient declines transfer to the ED.  Afebrile and vital signs are stable.  Urine does not show infection, urine pregnancy negative.  Patient obtained vaginal self swab for testing.  Tylenol as needed.  Instructed patient to follow-up with her PCP tomorrow.  ED precautions discussed.  Education provided on abdominal pain and dysuria.  She agrees to plan of care.  Final Clinical Impressions(s) / UC Diagnoses   Final diagnoses:  Lower abdominal pain  Dysuria  Vaginal discharge     Discharge Instructions      Your urine does not show signs of infection.  Your vaginal tests are pending.    Follow up with your primary care provider tomorrow.  Go to the emergency department if you have worsening symptoms.        ED Prescriptions   None    PDMP not reviewed this encounter.   Mickie Bail, NP 10/11/23 [phone]

## 2023-10-12 LAB — CERVICOVAGINAL ANCILLARY ONLY
Bacterial Vaginitis (gardnerella): NEGATIVE
Candida Glabrata: POSITIVE — AB
Candida Vaginitis: NEGATIVE
Chlamydia: NEGATIVE
Comment: NEGATIVE
Comment: NEGATIVE
Comment: NEGATIVE
Comment: NEGATIVE
Comment: NEGATIVE
Comment: NORMAL
Neisseria Gonorrhea: NEGATIVE
Trichomonas: NEGATIVE

## 2023-10-13 ENCOUNTER — Telehealth (HOSPITAL_COMMUNITY): Payer: Self-pay

## 2023-10-13 MED ORDER — BORIC ACID VAGINAL 600 MG VA SUPP: 600.0000 mg | Freq: Every day | VAGINAL | 0 refills | Status: AC

## 2023-10-13 NOTE — Telephone Encounter (Signed)
-----   Message from [facility] sent at 10/13/2023  2:34 PM EST ----- Because patient is having vaginal discharge, recommend boric acid 600 mg nightly x 3 weeks. ----- Message ----- From: Warren Danes, RN Sent: 10/13/2023   2:11 PM EST To: Mickie Bail, NP; Rushie Chestnut, PA-C  Hello, please advise regarding treatment for candida glabrata. Thank you

## 2023-10-14 ENCOUNTER — Ambulatory Visit: Payer: Medicaid Other | Admitting: Podiatry

## 2023-10-17 ENCOUNTER — Other Ambulatory Visit: Payer: Self-pay

## 2023-10-17 ENCOUNTER — Emergency Department
Admission: EM | Admit: 2023-10-17 | Discharge: 2023-10-17 | Disposition: A | Discharge status: Home / Self Care | Payer: Medicaid Other | Attending: Emergency Medicine | Admitting: Emergency Medicine

## 2023-10-17 DIAGNOSIS — J45909 Unspecified asthma, uncomplicated: Secondary | ICD-10-CM | POA: Insufficient documentation

## 2023-10-17 DIAGNOSIS — L600 Ingrowing nail: Secondary | ICD-10-CM | POA: Insufficient documentation

## 2023-10-17 DIAGNOSIS — M79674 Pain in right toe(s): Secondary | ICD-10-CM | POA: Diagnosis present

## 2023-10-17 MED ORDER — L.E.T. 4-0.05-0.5 % EX SOLN: 3.0000 mL | Freq: Once | CUTANEOUS | 2 refills | Status: AC

## 2023-10-17 MED ORDER — L.E.T. 4-0.05-0.5 % EX SOLN: 3.0000 mL | Freq: Once | CUTANEOUS | 0 refills | Status: DC

## 2023-10-17 MED ORDER — ONDANSETRON HCL 4 MG PO TABS: 4.0000 mg | ORAL_TABLET | Freq: Every day | ORAL | 1 refills | Status: AC | PRN

## 2023-10-17 NOTE — Discharge Instructions (Addendum)
 Continue to take Tylenol and ibuprofen as needed for pain.  I also encourage you to do daily soaks in Epsom salt.  You can also apply the L.E.T. gel to the area.  This is a numbing cream.  Follow up with podiatry.  I have attached information about ingrown toe nails for you to review.

## 2023-10-17 NOTE — ED Provider Notes (Signed)
 [facility] Provider Note    Event Date/Time   First MD Initiated Contact with Patient 10/17/23 1216     (approximate)   History   Toe Pain   HPI  Jennifer Richmond is a 38 y.o. female with PMH of panic attacks, anxiety, PTSD, bipolar disorder, asthma, GERD who presents for evaluation of right great toe pain.  Patient states that she has had pain for about a month.  She was supposed to see a podiatrist last week but her appointment was canceled due to the snow.  She has an appointment for March 4.  Patient states that her pain is so severe she does not think she can wait that long.  Her primary care provider told her it was an ingrown toenail.  She has not noticed any drainage.  She has soaked it in hot water with Epsom salt prior to coming.  She has been taking ibuprofen for pain.      Physical Exam   Triage Vital Signs: ED Triage Vitals  Encounter Vitals Group     BP 10/17/23 1153 115/82     Systolic BP Percentile --      Diastolic BP Percentile --      Pulse Rate 10/17/23 1153 (!) 111     Resp 10/17/23 1153 20     Temp 10/17/23 1153 97.9 F (36.6 C)     Temp Source 10/17/23 1153 Oral     SpO2 10/17/23 1153 100 %     Weight 10/17/23 1150 115 lb (52.2 kg)     Height 10/17/23 1150 5\' 5"  (1.651 m)     Head Circumference --      Peak Flow --      Pain Score 10/17/23 1149 3     Pain Loc --      Pain Education --      Exclude from Growth Chart --     Most recent vital signs: Vitals:   10/17/23 1153  BP: 115/82  Pulse: (!) 111  Resp: 20  Temp: 97.9 F (36.6 C)  SpO2: 100%   General: Awake, no distress.  CV:  Good peripheral perfusion.  Resp:  Normal effort.  Abd:  No distention.  Other:  Localized erythema to the medial side of the great toe, tender to palpation, no fluid collection or fluctuance, toe ROM maintained, dorsalis pedis pulse 2+ and regular.   ED Results / Procedures / Treatments   Labs (all labs ordered are listed, but  only abnormal results are displayed) Labs Reviewed - No data to display   PROCEDURES:  Critical Care performed: No  Procedures   MEDICATIONS ORDERED IN ED: Medications - No data to display   IMPRESSION / MDM / ASSESSMENT AND PLAN / ED COURSE  I reviewed the triage vital signs and the nursing notes.                             38 year old female presents for evaluation of an ingrown toenail. patient is tachycardic upon presentation otherwise vital signs are stable.  Patient NAD on exam.  Differential diagnosis includes, but is not limited to, paronychia, subungual hematoma, ingrown toenail, cellulitis.  Patient's presentation is most consistent with acute, uncomplicated illness.  Patient has an ingrown toenail of the right great toe.  There are no signs of infection at this time.  She was advised to continue Epsom salt soaks.  I also prescribed some LET  cream for pain relief.  She can continue to take Tylenol and ibuprofen as needed.  She is given a note for work.  I advised her to follow-up with podiatry with whom she already has an appointment.  She voiced understanding, all questions were answered and she was stable at discharge.     FINAL CLINICAL IMPRESSION(S) / ED DIAGNOSES   Final diagnoses:  Ingrown toenail of right foot     Rx / DC Orders   ED Discharge Orders          Ordered    lidocaine-EPINEPHrine-tetracaine (L.E.T.) 4-0.05-0.5 %   Once,   Status:  Discontinued        10/17/23 1327    lidocaine-EPINEPHrine-tetracaine (L.E.T.) 4-0.05-0.5 %   Once        10/17/23 1328             Note:  This document was prepared using Dragon voice recognition software and may include unintentional dictation errors.   Cameron Ali, PA-C 10/17/23 1330    Merwyn Katos, MD 10/17/23 1351

## 2023-10-17 NOTE — ED Triage Notes (Signed)
 Pt to ED for pain to R great toe since 1 month. Was supposed to see podiatrist last week but it snowed so appt was changed to March 4th, states she cannot make it that long. Painful to walk on, pain has been unbearable since last 1 week. PCP told her it is an ingrown toenail. Denies purulent drainage. States soaked toe in hot water with salt for about 2 hours before coming today.

## 2023-10-26 ENCOUNTER — Ambulatory Visit: Payer: Medicaid Other | Admitting: Podiatry

## 2023-10-26 ENCOUNTER — Encounter: Payer: Self-pay | Admitting: Podiatry

## 2023-10-26 DIAGNOSIS — L600 Ingrowing nail: Secondary | ICD-10-CM

## 2023-10-26 NOTE — Patient Instructions (Signed)

## 2023-10-26 NOTE — Progress Notes (Signed)
 Subjective:  Patient ID: Jennifer Richmond, female    DOB: 17-Feb-1986,  MRN: [id number]  Chief Complaint  Patient presents with   Ingrown Toenail    Left hallux nail discomfort     38 y.o. female presents with the above complaint.  Patient presents with left hallux lateral border ingrown painful to touch is progressive gotten worse worse with ambulation worse with pressure patient would like to have it removed denies seeing anyone else prior to seeing me denies any other acute complaints.   Review of Systems: Negative except as noted in the HPI. Denies N/V/F/Ch.  Past Medical History:  Diagnosis Date   Abdominal pain    Anxiety    Asthma    Back pain    Bipolar disorder (HCC)    GERD (gastroesophageal reflux disease)    Gross hematuria    Kidney stones    Panic attacks    Panic attacks    PTSD (post-traumatic stress disorder)    Urinary hesitancy    UTI (urinary tract infection)     Current Outpatient Medications:    Boric Acid Vaginal 600 MG SUPP, Place 600 mg vaginally at bedtime for 21 days., Disp: 21 suppository, Rfl: 0   albuterol (VENTOLIN HFA) 108 (90 Base) MCG/ACT inhaler, Inhale 1-2 puffs into the lungs every 6 (six) hours as needed., Disp: 18 g, Rfl: 0   benzonatate (TESSALON) 100 MG capsule, Take 1 capsule (100 mg total) by mouth 3 (three) times daily as needed for cough. (Patient not taking: Reported on 07/14/2023), Disp: 21 capsule, Rfl: 0   clonazePAM (KLONOPIN) 1 MG tablet, Take 1 mg by mouth 2 (two) times daily as needed for anxiety. , Disp: , Rfl:    clotrimazole (LOTRIMIN) 1 % cream, Apply a small amount to affected areas 2 times daily. Allow to dry completely., Disp: 28 g, Rfl: 0   fluconazole (DIFLUCAN) 150 MG tablet, Take 1 tablet (150 mg total) by mouth daily. Take one tablet today.  May repeat in 3 days., Disp: 2 tablet, Rfl: 0   ondansetron (ZOFRAN) 4 MG tablet, Take 1 tablet (4 mg total) by mouth daily as needed for nausea or vomiting., Disp: 30 tablet,  Rfl: 1   pantoprazole (PROTONIX) 40 MG tablet, Take 40 mg by mouth daily. (Patient not taking: Reported on 07/06/2023), Disp: , Rfl:    predniSONE (STERAPRED UNI-PAK 21 TAB) 10 MG (21) TBPK tablet, Take by mouth daily. As directed (Patient not taking: Reported on 08/06/2023), Disp: 21 tablet, Rfl: 0  Social History   Tobacco Use  Smoking Status Every Day   Current packs/day: 0.00   Types: Cigarettes  Smokeless Tobacco Never  Tobacco Comments   1-3 cigarettes per day    Allergies  Allergen Reactions   Clindamycin/Lincomycin     Possibly CLINDAMYCIN vaginal gel. Patient uncertain of drug   Shrimp [Shellfish Allergy]    Objective:  There were no vitals filed for this visit. There is no height or weight on file to calculate BMI. Constitutional Well developed. Well nourished.  Vascular Dorsalis pedis pulses palpable bilaterally. Posterior tibial pulses palpable bilaterally. Capillary refill normal to all digits.  No cyanosis or clubbing noted. Pedal hair growth normal.  Neurologic Normal speech. Oriented to person, place, and time. Epicritic sensation to light touch grossly present bilaterally.  Dermatologic Painful ingrowing nail at lateral nail borders of the hallux nail left. No other open wounds. No skin lesions.  Orthopedic: Normal joint ROM without pain or crepitus bilaterally. No visible  deformities. No bony tenderness.   Radiographs: None Assessment:  No diagnosis found. Plan:  Patient was evaluated and treated and all questions answered.  Ingrown Nail, left -Patient elects to proceed with minor surgery to remove ingrown toenail removal today. Consent reviewed and signed by patient. -Ingrown nail excised. See procedure note. -Educated on post-procedure care including soaking. Written instructions provided and reviewed. -Patient to follow up in 2 weeks for nail check.  Procedure: Excision of Ingrown Toenail Location: Left 1st toe lateral nail  borders. Anesthesia: Lidocaine 1% plain; 1.5 mL and Marcaine 0.5% plain; 1.5 mL, digital block. Skin Prep: Betadine. Dressing: Silvadene; telfa; dry, sterile, compression dressing. Technique: Following skin prep, the toe was exsanguinated and a tourniquet was secured at the base of the toe. The affected nail border was freed, split with a nail splitter, and excised. Chemical matrixectomy was then performed with phenol and irrigated out with alcohol. The tourniquet was then removed and sterile dressing applied. Disposition: Patient tolerated procedure well. Patient to return in 2 weeks for follow-up.   No follow-ups on file.

## 2024-01-11 ENCOUNTER — Ambulatory Visit: Admitting: Podiatry

## 2024-01-11 DIAGNOSIS — L600 Ingrowing nail: Secondary | ICD-10-CM

## 2024-01-11 NOTE — Progress Notes (Signed)
 Subjective: Jennifer Richmond is a 38 y.o.  female returns to office today for follow up evaluation after having left Hallux Medial border nail avulsion performed. Patient has been soaking using epsom salt and applying topical antibiotic covered with bandaid daily. Patient denies fevers, chills, nausea, vomiting. Denies any calf pain, chest pain, SOB.   Objective:  Vitals: Reviewed  General: Well developed, nourished, in no acute distress, alert and oriented x3   Dermatology: Skin is warm, dry and supple bilateral. Medial hallux nail border appears to be clean, dry, with mild granular tissue and surrounding scab. There is no surrounding erythema, edema, drainage/purulence. The remaining nails appear unremarkable at this time. There are no other lesions or other signs of infection present.  Neurovascular status: Intact. No lower extremity swelling; No pain with calf compression bilateral.  Musculoskeletal: Decreased tenderness to palpation of the Medial hallux nail fold(s). Muscular strength within normal limits bilateral.   Assesement and Plan: S/p partial nail avulsion, doing well.   -disContinue soaking in epsom salts twice a day followed by antibiotic ointment and a band-aid as needed. Can leave uncovered at night. .  -If the area does not heal properply, call the office for follow-up appointment, or sooner if any problems arise.  -Monitor for any signs/symptoms of infection. Call the office immediately if any occur or go directly to the emergency room. Call with any questions/concerns.  Jaquita Bessire, DPM

## 2024-03-11 ENCOUNTER — Encounter: Payer: Self-pay | Admitting: Emergency Medicine

## 2024-03-11 ENCOUNTER — Ambulatory Visit
Admission: EM | Admit: 2024-03-11 | Discharge: 2024-03-11 | Disposition: A | Discharge status: Home / Self Care | Attending: Emergency Medicine | Admitting: Emergency Medicine

## 2024-03-11 DIAGNOSIS — H109 Unspecified conjunctivitis: Secondary | ICD-10-CM

## 2024-03-11 MED ORDER — MOXIFLOXACIN HCL 0.5 % OP SOLN: 1.0000 [drp] | Freq: Three times a day (TID) | OPHTHALMIC | 0 refills | Status: AC

## 2024-03-11 NOTE — ED Triage Notes (Signed)
 Patient reports yesterday she felt like something was in her left eye. She flushed eye on yesterday felt a little better. Today she woke with drainage, redness and burning sensation. Also reports sensitivity to light.

## 2024-03-11 NOTE — Discharge Instructions (Addendum)
 Today you being treated for bacterial conjunctivitis.   Place one drop of moxifloxacin into the effected eye every 8 hours while awake for 7 days. If the other eye starts to have symptoms you may use medication in it as well. Do not allow tip of dropper to touch eye.  May use cool compress for comfort and to remove discharge if present. Pat the eye, do not wipe.  If wearing contacts, dispose of current pair. Wear glasses until symptoms have resolved.   Do not rub eyes, this may cause more irritation.  May use benadryl as needed to help if itching present.  Please avoid use of eye makeup until symptoms clear.  If symptoms persist after use of medication, please follow up at Urgent Care or with ophthalmologist (eye doctor)

## 2024-03-11 NOTE — ED Provider Notes (Signed)
 Jennifer Richmond    CSN: [id number] Arrival date & time: 03/11/24  0825      History   Chief Complaint Chief Complaint  Patient presents with   Eye Drainage    HPI Jennifer Richmond is a 38 y.o. female.   Patient presents for evaluation of sensation of a foreign body to the left eye beginning 1 day ago.  Began to experience purulent drainage, pruritus, light sensitivity and pain beginning this morning.  Has attempted to flush the eye which seemed helpful, additionally has attempted over-the-counter eyedrops.  No known sick contacts.  Denies use of contacts.  Denies injury or trauma.  Past Medical History:  Diagnosis Date   Abdominal pain    Anxiety    Asthma    Back pain    Bipolar disorder (HCC)    GERD (gastroesophageal reflux disease)    Gross hematuria    Kidney stones    Panic attacks    Panic attacks    PTSD (post-traumatic stress disorder)    Urinary hesitancy    UTI (urinary tract infection)     Patient Active Problem List   Diagnosis Date Noted   Admission for sterilization 06/05/2016   Flank pain 03/05/2016   Nephrolithiasis 02/25/2016    Past Surgical History:  Procedure Laterality Date   CYSTOSCOPY WITH BIOPSY  05/11/2016   Procedure: CYSTOSCOPY WITH BIOPSY;  Surgeon: Rosina Riis, MD;  Location: [facility];  Service: Urology;;   DILATION AND CURETTAGE OF UTERUS     EXTRACORPOREAL SHOCK WAVE LITHOTRIPSY Left 09/10/2022   Procedure: EXTRACORPOREAL SHOCK WAVE LITHOTRIPSY (ESWL);  Surgeon: Twylla Glendia BROCKS, MD;  Location: [facility];  Service: Urology;  Laterality: Left;   LAPAROSCOPIC OVARIAN CYSTECTOMY     LAPAROSCOPIC TUBAL LIGATION Bilateral 06/05/2016   Procedure: LAPAROSCOPIC TUBAL LIGATION with bilateral salpingectomy;  Surgeon: Garnette JONETTA Mace, MD;  Location: [facility];  Service: Gynecology;  Laterality: Bilateral;   URETEROSCOPY Right 05/11/2016   Procedure: URETEROSCOPY;  Surgeon: Rosina Riis, MD;  Location: [facility];  Service: Urology;   Laterality: Right;    OB History     Gravida  4   Para  2   Term  2   Preterm      AB  2   Living  2      SAB  1   IAB  1   Ectopic      Multiple  0   Live Births  2            Home Medications    Prior to Admission medications   Medication Sig Start Date End Date Taking? Authorizing Provider  moxifloxacin  (VIGAMOX ) 0.5 % ophthalmic solution Place 1 drop into the left eye 3 (three) times daily. 03/11/24  Yes Linlee Cromie R, NP  albuterol  (VENTOLIN  HFA) 108 (90 Base) MCG/ACT inhaler Inhale 1-2 puffs into the lungs every 6 (six) hours as needed. 07/06/23   Corlis Burnard DEL, NP  benzonatate  (TESSALON ) 100 MG capsule Take 1 capsule (100 mg total) by mouth 3 (three) times daily as needed for cough. Patient not taking: Reported on 07/14/2023 07/06/23   Corlis Burnard DEL, NP  clonazePAM (KLONOPIN) 1 MG tablet Take 1 mg by mouth 2 (two) times daily as needed for anxiety.     [provider]  clotrimazole  (LOTRIMIN ) 1 % cream Apply a small amount to affected areas 2 times daily. Allow to dry completely. 10/23/22   Immordino, Garnette, FNP  fluconazole  (DIFLUCAN ) 150 MG tablet  Take 1 tablet (150 mg total) by mouth daily. Take one tablet today.  May repeat in 3 days. 08/11/23   Corlis Burnard DEL, NP  ondansetron  (ZOFRAN ) 4 MG tablet Take 1 tablet (4 mg total) by mouth daily as needed for nausea or vomiting. 10/17/23 10/16/24  Cleaster Tinnie LABOR, PA-C  pantoprazole  (PROTONIX ) 40 MG tablet Take 40 mg by mouth daily. Patient not taking: Reported on 07/06/2023    [provider]  predniSONE  (STERAPRED UNI-PAK 21 TAB) 10 MG (21) TBPK tablet Take by mouth daily. As directed Patient not taking: Reported on 08/06/2023 07/14/23   Corlis Burnard DEL, NP    Family History Family History  Problem Relation Age of Onset   Diabetes Father    Kidney nephrosis Father    Seizures Mother    Cancer Maternal Grandfather 51       pancreatic and colon 6s   Cancer Paternal Grandmother         ovarian   Kidney cancer Neg Hx    Prostate cancer Neg Hx    Bladder Cancer Neg Hx     Social History Social History   Tobacco Use   Smoking status: Every Day    Current packs/day: 0.00    Types: Cigarettes   Smokeless tobacco: Never   Tobacco comments:    1-3 cigarettes per day  Vaping Use   Vaping status: Never Used  Substance Use Topics   Alcohol use: Yes    Comment: Occ   Drug use: Yes    Types: Marijuana    Comment: PT DENIES BUT HAD A + UDS FOR MARIJUANA IN 2013     Allergies   Clindamycin/lincomycin and Shrimp [shellfish allergy]   Review of Systems Review of Systems   Physical Exam Triage Vital Signs ED Triage Vitals  Encounter Vitals Group     BP 03/11/24 0836 102/72     Girls Systolic BP Percentile --      Girls Diastolic BP Percentile --      Boys Systolic BP Percentile --      Boys Diastolic BP Percentile --      Pulse Rate 03/11/24 0836 71     Resp 03/11/24 0836 18     Temp 03/11/24 0836 98.1 F (36.7 C)     Temp Source 03/11/24 0836 Oral     SpO2 03/11/24 0836 100 %     Weight --      Height --      Head Circumference --      Peak Flow --      Pain Score 03/11/24 0833 0     Pain Loc --      Pain Education --      Exclude from Growth Chart --    No data found.  Updated Vital Signs BP 102/72 (BP Location: Left Arm)   Pulse 71   Temp 98.1 F (36.7 C) (Oral)   Resp 18   LMP 02/28/2024   SpO2 100%   Visual Acuity Right Eye Distance: 20/25 Left Eye Distance: 20/40 Bilateral Distance: 20/25  Right Eye Near:   Left Eye Near:    Bilateral Near:     Physical Exam Constitutional:      Appearance: Normal appearance.  Eyes:     Comments: Erythema present to the left conjunctiva with scant purulent drainage present to the lower lash line, vision grossly intact, extraocular movements intact  Pulmonary:     Effort: Pulmonary effort is normal.  Neurological:  Mental Status: She is alert and oriented to person, place, and time.  Mental status is at baseline.      UC Treatments / Results  Labs (all labs ordered are listed, but only abnormal results are displayed) Labs Reviewed - No data to display  EKG   Radiology No results found.  Procedures Procedures (including critical care time)  Medications Ordered in UC Medications - No data to display  Initial Impression / Assessment and Plan / UC Course  I have reviewed the triage vital signs and the nursing notes.  Pertinent labs & imaging results that were available during my care of the patient were reviewed by me and considered in my medical decision making (see chart for details).  Bacterial conjunctivitis of left eye  Presentation and symptomology consistent with above diagnosis, no stye noted on exam, prescribed moxifloxacin  discussed administration recommended over-the-counter medications and nonpharmacological supportive care with follow-up if symptoms persist or worsen Final Clinical Impressions(s) / UC Diagnoses   Final diagnoses:  Bacterial conjunctivitis of left eye     Discharge Instructions      Today you being treated for bacterial conjunctivitis.   Place one drop of moxifloxacin  into the effected eye every 8 hours while awake for 7 days. If the other eye starts to have symptoms you may use medication in it as well. Do not allow tip of dropper to touch eye.  May use cool compress for comfort and to remove discharge if present. Pat the eye, do not wipe.  If wearing contacts, dispose of current pair. Wear glasses until symptoms have resolved.   Do not rub eyes, this may cause more irritation.  May use benadryl  as needed to help if itching present.  Please avoid use of eye makeup until symptoms clear.  If symptoms persist after use of medication, please follow up at Urgent Care or with ophthalmologist (eye doctor)    ED Prescriptions     Medication Sig Dispense Auth. Provider   moxifloxacin  (VIGAMOX ) 0.5 % ophthalmic solution  Place 1 drop into the left eye 3 (three) times daily. 3 mL Teresa Shelba SAUNDERS, NP      PDMP not reviewed this encounter.   Teresa Shelba SAUNDERS, [place] 03/11/24 [phone]

## 2024-04-22 ENCOUNTER — Ambulatory Visit: Admission: EM | Admit: 2024-04-22 | Discharge: 2024-04-22 | Discharge status: Against Medical Advice

## 2024-06-24 ENCOUNTER — Ambulatory Visit
Admission: EM | Admit: 2024-06-24 | Discharge: 2024-06-24 | Disposition: A | Discharge status: Home / Self Care | Attending: Emergency Medicine | Admitting: Emergency Medicine

## 2024-06-24 DIAGNOSIS — J029 Acute pharyngitis, unspecified: Secondary | ICD-10-CM

## 2024-06-24 LAB — POCT RAPID STREP A (OFFICE): Rapid Strep A Screen: NEGATIVE

## 2024-06-24 NOTE — Discharge Instructions (Addendum)
Your strep test is negative.  Take Tylenol or ibuprofen as needed.  Follow up with your primary care provider if your symptoms are not improving.   ° ° °

## 2024-06-24 NOTE — ED Provider Notes (Signed)
 Jennifer Richmond    CSN: [id number] Arrival date & time: 06/24/24  1512      History   Chief Complaint Chief Complaint  Patient presents with   Sore Throat    HPI Jennifer Richmond is a 38 y.o. female.  Patient presents with sore throat, headache, decreased appetite x 1 day.  Her son has strep throat.  Patient took 2 doses of leftover amoxicillin .  No fever, cough, shortness of breath.  The history is provided by the patient and medical records.    Past Medical History:  Diagnosis Date   Abdominal pain    Anxiety    Asthma    Back pain    Bipolar disorder (HCC)    GERD (gastroesophageal reflux disease)    Gross hematuria    Kidney stones    Panic attacks    Panic attacks    PTSD (post-traumatic stress disorder)    Urinary hesitancy    UTI (urinary tract infection)     Patient Active Problem List   Diagnosis Date Noted   Admission for sterilization 06/05/2016   Flank pain 03/05/2016   Nephrolithiasis 02/25/2016    Past Surgical History:  Procedure Laterality Date   CYSTOSCOPY WITH BIOPSY  05/11/2016   Procedure: CYSTOSCOPY WITH BIOPSY;  Surgeon: Rosina Riis, MD;  Location: [facility];  Service: Urology;;   DILATION AND CURETTAGE OF UTERUS     EXTRACORPOREAL SHOCK WAVE LITHOTRIPSY Left 09/10/2022   Procedure: EXTRACORPOREAL SHOCK WAVE LITHOTRIPSY (ESWL);  Surgeon: Twylla Glendia BROCKS, MD;  Location: [facility];  Service: Urology;  Laterality: Left;   LAPAROSCOPIC OVARIAN CYSTECTOMY     LAPAROSCOPIC TUBAL LIGATION Bilateral 06/05/2016   Procedure: LAPAROSCOPIC TUBAL LIGATION with bilateral salpingectomy;  Surgeon: Garnette JONETTA Mace, MD;  Location: [facility];  Service: Gynecology;  Laterality: Bilateral;   URETEROSCOPY Right 05/11/2016   Procedure: URETEROSCOPY;  Surgeon: Rosina Riis, MD;  Location: [facility];  Service: Urology;  Laterality: Right;    OB History     Gravida  4   Para  2   Term  2   Preterm      AB  2   Living  2      SAB  1   IAB   1   Ectopic      Multiple  0   Live Births  2            Home Medications    Prior to Admission medications   Medication Sig Start Date End Date Taking? Authorizing Provider  albuterol  (VENTOLIN  HFA) 108 (90 Base) MCG/ACT inhaler Inhale 1-2 puffs into the lungs every 6 (six) hours as needed. 07/06/23   Corlis Burnard DEL, NP  benzonatate  (TESSALON ) 100 MG capsule Take 1 capsule (100 mg total) by mouth 3 (three) times daily as needed for cough. Patient not taking: Reported on 07/14/2023 07/06/23   Corlis Burnard DEL, NP  clonazePAM (KLONOPIN) 1 MG tablet Take 1 mg by mouth 2 (two) times daily as needed for anxiety.     [provider]  clotrimazole  (LOTRIMIN ) 1 % cream Apply a small amount to affected areas 2 times daily. Allow to dry completely. 10/23/22   Immordino, Garnette, FNP  fluconazole  (DIFLUCAN ) 150 MG tablet Take 1 tablet (150 mg total) by mouth daily. Take one tablet today.  May repeat in 3 days. Patient not taking: Reported on 06/24/2024 08/11/23   Corlis Burnard DEL, NP  moxifloxacin  (VIGAMOX ) 0.5 % ophthalmic solution Place 1 drop into  the left eye 3 (three) times daily. 03/11/24   Teresa Shelba SAUNDERS, NP  ondansetron  (ZOFRAN ) 4 MG tablet Take 1 tablet (4 mg total) by mouth daily as needed for nausea or vomiting. 10/17/23 10/16/24  Cleaster Tinnie LABOR, PA-C  pantoprazole  (PROTONIX ) 40 MG tablet Take 40 mg by mouth daily. Patient not taking: Reported on 07/06/2023    [provider]  predniSONE  (STERAPRED UNI-PAK 21 TAB) 10 MG (21) TBPK tablet Take by mouth daily. As directed Patient not taking: Reported on 08/06/2023 07/14/23   Corlis Burnard DEL, NP    Family History Family History  Problem Relation Age of Onset   Diabetes Father    Kidney nephrosis Father    Seizures Mother    Cancer Maternal Grandfather 83       pancreatic and colon 73s   Cancer Paternal Grandmother        ovarian   Kidney cancer Neg Hx    Prostate cancer Neg Hx    Bladder Cancer Neg Hx      Social History Social History   Tobacco Use   Smoking status: Every Day    Current packs/day: 0.00    Types: Cigarettes   Smokeless tobacco: Never   Tobacco comments:    1-3 cigarettes per day  Vaping Use   Vaping status: Never Used  Substance Use Topics   Alcohol use: Yes    Comment: Occ   Drug use: Yes    Types: Marijuana    Comment: PT DENIES BUT HAD A + UDS FOR MARIJUANA IN 2013     Allergies   Clindamycin/lincomycin and Shrimp [shellfish allergy]   Review of Systems Review of Systems  Constitutional:  Negative for chills and fever.  HENT:  Positive for sore throat. Negative for ear pain.   Respiratory:  Negative for cough and shortness of breath.   Neurological:  Positive for headaches.     Physical Exam Triage Vital Signs ED Triage Vitals  Encounter Vitals Group     BP 06/24/24 1520 122/77     Girls Systolic BP Percentile --      Girls Diastolic BP Percentile --      Boys Systolic BP Percentile --      Boys Diastolic BP Percentile --      Pulse Rate 06/24/24 1520 86     Resp 06/24/24 1520 18     Temp 06/24/24 1520 98.2 F (36.8 C)     Temp src --      SpO2 06/24/24 1520 98 %     Weight --      Height --      Head Circumference --      Peak Flow --      Pain Score 06/24/24 1515 2     Pain Loc --      Pain Education --      Exclude from Growth Chart --    No data found.  Updated Vital Signs BP 122/77   Pulse 86   Temp 98.2 F (36.8 C)   Resp 18   SpO2 98%   Visual Acuity Right Eye Distance:   Left Eye Distance:   Bilateral Distance:    Right Eye Near:   Left Eye Near:    Bilateral Near:     Physical Exam Constitutional:      General: She is not in acute distress. HENT:     Right Ear: Tympanic membrane normal.     Left Ear: Tympanic membrane normal.  Nose: Nose normal.     Mouth/Throat:     Mouth: Mucous membranes are moist.     Pharynx: Oropharynx is clear.  Cardiovascular:     Rate and Rhythm: Normal rate and  regular rhythm.     Heart sounds: Normal heart sounds.  Pulmonary:     Effort: Pulmonary effort is normal. No respiratory distress.     Breath sounds: Normal breath sounds.  Neurological:     Mental Status: She is alert.      UC Treatments / Results  Labs (all labs ordered are listed, but only abnormal results are displayed) Labs Reviewed  POCT RAPID STREP A (OFFICE) - Normal    EKG   Radiology No results found.  Procedures Procedures (including critical care time)  Medications Ordered in UC Medications - No data to display  Initial Impression / Assessment and Plan / UC Course  I have reviewed the triage vital signs and the nursing notes.  Pertinent labs & imaging results that were available during my care of the patient were reviewed by me and considered in my medical decision making (see chart for details).    Sore throat.  Afebrile and vital signs are stable.  Rapid strep negative.  Discussed symptomatic treatment including Tylenol  or ibuprofen  as needed.  Education provided on sore throat.  Instructed patient to follow-up with her PCP if she is not improving.  She agrees to plan of care.  Final Clinical Impressions(s) / UC Diagnoses   Final diagnoses:  Sore throat     Discharge Instructions      Your strep test is negative.  Take Tylenol  or ibuprofen  as needed.  Follow-up with your primary care provider if your symptoms are not improving.      ED Prescriptions   None    PDMP not reviewed this encounter.   Corlis Burnard DEL, NP 06/24/24 1539

## 2024-06-24 NOTE — ED Triage Notes (Addendum)
 Patient to Urgent Care with complaints of intermittent sore throat/ poor appetite/ headaches. Woke up drenched in sweat.   Symptoms started yesterday. Son strep positive.  Reports she took a left over amoxicillin  yesterday. Requests diflucan  if prescribed abx today.

## 2024-08-25 ENCOUNTER — Emergency Department: Admission: EM | Admit: 2024-08-25 | Discharge: 2024-08-25 | Disposition: A | Discharge status: Home / Self Care

## 2024-08-25 ENCOUNTER — Emergency Department

## 2024-08-25 ENCOUNTER — Other Ambulatory Visit: Payer: Self-pay

## 2024-08-25 DIAGNOSIS — R1031 Right lower quadrant pain: Secondary | ICD-10-CM | POA: Diagnosis present

## 2024-08-25 DIAGNOSIS — N132 Hydronephrosis with renal and ureteral calculous obstruction: Secondary | ICD-10-CM | POA: Diagnosis not present

## 2024-08-25 DIAGNOSIS — N200 Calculus of kidney: Secondary | ICD-10-CM

## 2024-08-25 DIAGNOSIS — J45909 Unspecified asthma, uncomplicated: Secondary | ICD-10-CM | POA: Diagnosis not present

## 2024-08-25 DIAGNOSIS — R319 Hematuria, unspecified: Secondary | ICD-10-CM

## 2024-08-25 LAB — COMPREHENSIVE METABOLIC PANEL WITH GFR
ALT: 8 U/L (ref 0–44)
AST: 17 U/L (ref 15–41)
Albumin: 4.4 g/dL (ref 3.5–5.0)
Alkaline Phosphatase: 57 U/L (ref 38–126)
Anion gap: 10 (ref 5–15)
BUN: 13 mg/dL (ref 6–20)
CO2: 24 mmol/L (ref 22–32)
Calcium: 9.3 mg/dL (ref 8.9–10.3)
Chloride: 107 mmol/L (ref 98–111)
Creatinine, Ser: 0.77 mg/dL (ref 0.44–1.00)
GFR, Estimated: >60 mL/min
Glucose, Bld: 85 mg/dL (ref 70–99)
Potassium: 3.6 mmol/L (ref 3.5–5.1)
Sodium: 140 mmol/L (ref 135–145)
Total Bilirubin: 0.5 mg/dL (ref 0.0–1.2)
Total Protein: 6.7 g/dL (ref 6.5–8.1)

## 2024-08-25 LAB — CBC WITH DIFFERENTIAL/PLATELET
Abs Immature Granulocytes: 0.08 K/uL — ABNORMAL HIGH (ref 0.00–0.07)
Basophils Absolute: 0.1 K/uL (ref 0.0–0.1)
Basophils Relative: 1 %
Eosinophils Absolute: 0.3 K/uL (ref 0.0–0.5)
Eosinophils Relative: 2 %
HCT: 45.1 % (ref 36.0–46.0)
Hemoglobin: 14.8 g/dL (ref 12.0–15.0)
Immature Granulocytes: 1 %
Lymphocytes Relative: 26 %
Lymphs Abs: 2.8 K/uL (ref 0.7–4.0)
MCH: 30.3 pg (ref 26.0–34.0)
MCHC: 32.8 g/dL (ref 30.0–36.0)
MCV: 92.4 fL (ref 80.0–100.0)
Monocytes Absolute: 0.5 K/uL (ref 0.1–1.0)
Monocytes Relative: 5 %
Neutro Abs: 6.8 K/uL (ref 1.7–7.7)
Neutrophils Relative %: 65 %
Platelets: 227 K/uL (ref 150–400)
RBC: 4.88 MIL/uL (ref 3.87–5.11)
RDW: 12.9 % (ref 11.5–15.5)
WBC: 10.5 K/uL (ref 4.0–10.5)
nRBC: 0 % (ref 0.0–0.2)

## 2024-08-25 LAB — CHLAMYDIA/NGC RT PCR (ARMC ONLY)
Chlamydia Tr: NOT DETECTED
N gonorrhoeae: NOT DETECTED

## 2024-08-25 LAB — URINALYSIS, ROUTINE W REFLEX MICROSCOPIC
Bilirubin Urine: NEGATIVE
Glucose, UA: NEGATIVE mg/dL
Ketones, ur: NEGATIVE mg/dL
Leukocytes,Ua: NEGATIVE
Nitrite: NEGATIVE
Protein, ur: NEGATIVE mg/dL
RBC / HPF: >50 RBC/hpf (ref 0–5)
Specific Gravity, Urine: 1.008 (ref 1.005–1.030)
pH: 5 (ref 5.0–8.0)

## 2024-08-25 LAB — WET PREP, GENITAL
Clue Cells Wet Prep HPF POC: NONE SEEN
Trich, Wet Prep: NONE SEEN
WBC, Wet Prep HPF POC: <10
Yeast Wet Prep HPF POC: NONE SEEN

## 2024-08-25 LAB — PREGNANCY, URINE: Preg Test, Ur: NEGATIVE

## 2024-08-25 MED ORDER — ONDANSETRON HCL 4 MG/2ML IJ SOLN
4.0000 mg | Freq: Once | INTRAMUSCULAR | Status: AC
  Administered 2024-08-25: 4 mg via INTRAVENOUS
  Filled 2024-08-25: qty 2

## 2024-08-25 MED ORDER — KETOROLAC TROMETHAMINE 10 MG PO TABS: 10.0000 mg | ORAL_TABLET | Freq: Four times a day (QID) | ORAL | 0 refills | Status: AC | PRN

## 2024-08-25 MED ORDER — KETOROLAC TROMETHAMINE 15 MG/ML IJ SOLN
15.0000 mg | Freq: Once | INTRAMUSCULAR | Status: AC
  Administered 2024-08-25: 15 mg via INTRAVENOUS
  Filled 2024-08-25: qty 1

## 2024-08-25 MED ORDER — OXYCODONE-ACETAMINOPHEN 5-325 MG PO TABS: 1.0000 | ORAL_TABLET | Freq: Four times a day (QID) | ORAL | 0 refills | Status: DC | PRN

## 2024-08-25 MED ORDER — TAMSULOSIN HCL 0.4 MG PO CAPS: 0.4000 mg | ORAL_CAPSULE | Freq: Every day | ORAL | 0 refills | Status: AC

## 2024-08-25 MED ORDER — SODIUM CHLORIDE 0.9 % IV BOLUS
1000.0000 mL | Freq: Once | INTRAVENOUS | Status: AC
  Administered 2024-08-25: 1000 mL via INTRAVENOUS

## 2024-08-25 MED ORDER — MORPHINE SULFATE (PF) 4 MG/ML IV SOLN
4.0000 mg | Freq: Once | INTRAVENOUS | Status: AC
  Administered 2024-08-25: 4 mg via INTRAVENOUS
  Filled 2024-08-25: qty 1

## 2024-08-25 MED ORDER — TAMSULOSIN HCL 0.4 MG PO CAPS
0.4000 mg | ORAL_CAPSULE | Freq: Once | ORAL | Status: AC
  Administered 2024-08-25: 0.4 mg via ORAL
  Filled 2024-08-25: qty 1

## 2024-08-25 NOTE — Discharge Instructions (Signed)
 You have a 4 mm kidney stone on the right side.  If you have not passed the stone over the weekend please follow-up with urology.  You can see [facility] urological or your regular urologist. Take pain medication as prescribed. Return emergency department for worsening

## 2024-08-25 NOTE — ED Triage Notes (Signed)
 Pt to ED for c/o RLQ pain, burning with urination, and urinary frequency since 8:30 this morning.

## 2024-08-25 NOTE — ED Provider Notes (Signed)
" °  Physical Exam  BP 116/70   Pulse 77   Temp 98 F (36.7 C) (Oral)   Resp 17   Ht 5' 5 (1.651 m)   Wt 56.7 kg   SpO2 98%   BMI 20.80 kg/m   Physical Exam  Procedures  Procedures  ED Course / MDM    Medical Decision Making Amount and/or Complexity of Data Reviewed Labs: ordered. Radiology: ordered.  Risk Prescription drug management.   Assuming care of the patient from Shona Gin, PA-C.  See original H&P.  Plan at this time is to evaluate for kidney stone/UTI. Labs are reassuring, pregnancy test negative  CT renal stone, independent review and interpretation of the radiologist read and images shows a 4 mm kidney stone at the right UVJ.  I did explain these findings to the patient.  With there being a kidney stone noted in this area, we will go ahead and give her Toradol  15 mg IV, Flomax  p.o., and normal saline 1 L IV.  Will send medication to her pharmacy.  She was given prescription for Toradol , Flomax , and Percocet       Gasper Devere ORN, PA-C 08/25/24 1720    Clarine Ozell LABOR, MD 08/26/24 0009  "

## 2024-08-25 NOTE — ED Provider Notes (Signed)
 "  [facility] Provider Note    Event Date/Time   First MD Initiated Contact with Patient 08/25/24 1341     (approximate)   History   Urinary Frequency and Flank Pain   HPI  Jennifer Richmond is a 39 y.o. female presents to the ED with complaint of right lower quadrant pain and burning with urination.  Patient has also had urinary frequency since 8:30 AM.  Patient had nausea with some vomiting yesterday but continues to have nausea today.  Patient has a history of urinary tract infections and kidney stones.  She denies any fever, chills, diarrhea.  Patient also has a history of asthma, anxiety, GERD, PTSD, bipolar.     Physical Exam   Triage Vital Signs: ED Triage Vitals  Encounter Vitals Group     BP 08/25/24 1320 103/62     Girls Systolic BP Percentile --      Girls Diastolic BP Percentile --      Boys Systolic BP Percentile --      Boys Diastolic BP Percentile --      Pulse Rate 08/25/24 1320 77     Resp 08/25/24 1320 17     Temp 08/25/24 1320 98 F (36.7 C)     Temp Source 08/25/24 1320 Oral     SpO2 08/25/24 1320 98 %     Weight 08/25/24 1328 125 lb (56.7 kg)     Height 08/25/24 1328 5' 5 (1.651 m)     Head Circumference --      Peak Flow --      Pain Score 08/25/24 1325 7     Pain Loc --      Pain Education --      Exclude from Growth Chart --     Most recent vital signs: Vitals:   08/25/24 1320 08/25/24 1413  BP: 103/62 116/70  Pulse: 77   Resp: 17   Temp: 98 F (36.7 C)   SpO2: 98%      General: Awake, no distress.  CV:  Good peripheral perfusion.  Resp:  Normal effort.  Abd:  No distention.  Other:     ED Results / Procedures / Treatments   Labs (all labs ordered are listed, but only abnormal results are displayed) Labs Reviewed  CBC WITH DIFFERENTIAL/PLATELET - Abnormal; Notable for the following components:      Result Value   Abs Immature Granulocytes 0.08 (*)    All other components within normal limits   URINALYSIS, ROUTINE W REFLEX MICROSCOPIC - Abnormal; Notable for the following components:   Color, Urine STRAW (*)    APPearance CLEAR (*)    Hgb urine dipstick LARGE (*)    Bacteria, UA RARE (*)    All other components within normal limits  CHLAMYDIA/NGC RT PCR ([facility] ONLY)            WET PREP, GENITAL  COMPREHENSIVE METABOLIC PANEL WITH GFR  PREGNANCY, URINE     RADIOLOGY CT renal stone study    PROCEDURES:  Critical Care performed:   Procedures   MEDICATIONS ORDERED IN ED: Medications  ondansetron  (ZOFRAN ) injection 4 mg (4 mg Intravenous Given 08/25/24 1414)  morphine  (PF) 4 MG/ML injection 4 mg (4 mg Intravenous Given 08/25/24 1415)     IMPRESSION / MDM / ASSESSMENT AND PLAN / ED COURSE  I reviewed the triage vital signs and the nursing notes.   Differential diagnosis includes, but is not limited to, urinary tract infection, urolithiasis, pyelonephritis,  hematuria.  39 year old female presents to the ED with complaint of right lank pain and urinary frequency.  Patient has a history of urinary tract infections and also kidney stones.  She was given morphine  4 mg IV and Zofran  prior to her CT scan.  While in the ED patient requested STI testing per nursing staff.  GC, gonorrhea and wet prep are pending.      Patient's presentation is most consistent with acute complicated illness / injury requiring diagnostic workup.  FINAL CLINICAL IMPRESSION(S) / ED DIAGNOSES   Final diagnoses:  Hematuria, unspecified type     Rx / DC Orders   ED Discharge Orders     None        Note:  This document was prepared using Dragon voice recognition software and may include unintentional dictation errors.   Saunders Shona CROME, PA-C 08/25/24 1539    Bradler, Evan K, MD 08/26/24 [phone]  "

## 2024-08-28 ENCOUNTER — Other Ambulatory Visit: Payer: Self-pay

## 2024-08-28 DIAGNOSIS — N200 Calculus of kidney: Secondary | ICD-10-CM

## 2024-08-28 NOTE — Progress Notes (Signed)
 "    08/29/2024 9:01 AM   Jennifer Richmond Dec 01, 1985 [id number]  Referring provider: Donal Channing SQUIBB, FNP [address] [place],  [place] [address]  Urological history: 1. Bladder lesion - TURBT (2017) PUNLMP  2. Nephrolithiasis - stone analysis 40% calcium  oxalate monohydrate, 40% calcium  phosphate, and 20% calcium  oxalate dihydrate  - Right URS (2017)   Chief Complaint  Patient presents with   Nephrolithiasis   HPI: Jennifer Richmond is a 39 y.o. woman who presents today for a right ureteral stone.   Previous records reviewed.  She was seen in the ED on January 3 for a right 4 mm UVJ stone.  There is no leukocytosis on CBC, serum creatinine normal and UA showed microscopic hematuria.  She was given oxycodone /acetaminophen  5/325, Toradol  10 mg and tamsulosin  0.4 mg daily upon discharge.    She has been having episodes of 10 out of 10 pain.  Associated with nausea and vomiting.  She does not think she has had any fevers.  Patient denies any modifying or aggravating factors.  Patient denies any recent UTI's, gross hematuria, dysuria or suprapubic/flank pain.  Patient denies any fevers, chills, nausea or vomiting.    KUB 4 mm stone visible in the right UVJ   UA yellow slightly cloudy, specific gravity greater than 1.030, pH 5.5, 3+ heme, 6-10 WBCs, greater than 30 RBCs, greater than 10 epithelial cells, mucus threads present and moderate bacteria.    PMH: Past Medical History:  Diagnosis Date   Abdominal pain    Anxiety    Asthma    Back pain    Bipolar disorder (HCC)    GERD (gastroesophageal reflux disease)    Gross hematuria    Kidney stones    Panic attacks    Panic attacks    PTSD (post-traumatic stress disorder)    Urinary hesitancy    UTI (urinary tract infection)     Surgical History: Past Surgical History:  Procedure Laterality Date   CYSTOSCOPY WITH BIOPSY  05/11/2016   Procedure: CYSTOSCOPY WITH BIOPSY;  Surgeon: Rosina Riis, MD;  Location: [facility]  ORS;  Service: Urology;;   DILATION AND CURETTAGE OF UTERUS     EXTRACORPOREAL SHOCK WAVE LITHOTRIPSY Left 09/10/2022   Procedure: EXTRACORPOREAL SHOCK WAVE LITHOTRIPSY (ESWL);  Surgeon: Twylla Glendia BROCKS, MD;  Location: [facility] ORS;  Service: Urology;  Laterality: Left;   LAPAROSCOPIC OVARIAN CYSTECTOMY     LAPAROSCOPIC TUBAL LIGATION Bilateral 06/05/2016   Procedure: LAPAROSCOPIC TUBAL LIGATION with bilateral salpingectomy;  Surgeon: Garnette JONETTA Mace, MD;  Location: [facility] ORS;  Service: Gynecology;  Laterality: Bilateral;   URETEROSCOPY Right 05/11/2016   Procedure: URETEROSCOPY;  Surgeon: Rosina Riis, MD;  Location: [facility] ORS;  Service: Urology;  Laterality: Right;    Home Medications:  Allergies as of 08/29/2024       Reactions   Clindamycin/lincomycin    Possibly CLINDAMYCIN vaginal gel. Patient uncertain of drug   Shrimp [shellfish Allergy]         Medication List        Accurate as of August 29, 2024  9:01 AM. If you have any questions, ask your nurse or doctor.          albuterol  108 (90 Base) MCG/ACT inhaler Commonly known as: VENTOLIN  HFA Inhale 1-2 puffs into the lungs every 6 (six) hours as needed.   benzonatate  100 MG capsule Commonly known as: TESSALON  Take 1 capsule (100 mg total) by mouth 3 (three) times daily as needed for cough.  clonazePAM 1 MG tablet Commonly known as: KLONOPIN Take 1 mg by mouth 2 (two) times daily as needed for anxiety.   clotrimazole  1 % cream Commonly known as: LOTRIMIN  Apply a small amount to affected areas 2 times daily. Allow to dry completely.   fluconazole  150 MG tablet Commonly known as: Diflucan  Take 1 tablet (150 mg total) by mouth daily. Take one tablet today.  May repeat in 3 days.   ketorolac  10 MG tablet Commonly known as: TORADOL  Take 1 tablet (10 mg total) by mouth every 6 (six) hours as needed.   moxifloxacin  0.5 % ophthalmic solution Commonly known as: Vigamox  Place 1 drop into the left eye 3 (three) times  daily.   ondansetron  4 MG tablet Commonly known as: Zofran  Take 1 tablet (4 mg total) by mouth daily as needed for nausea or vomiting.   oxyCODONE -acetaminophen  5-325 MG tablet Commonly known as: Percocet Take 1 tablet by mouth every 6 (six) hours as needed for up to 15 doses for severe pain (pain score 7-10).   pantoprazole  40 MG tablet Commonly known as: PROTONIX  Take 40 mg by mouth daily.   predniSONE  10 MG (21) Tbpk tablet Commonly known as: STERAPRED UNI-PAK 21 TAB Take by mouth daily. As directed   tamsulosin  0.4 MG Caps capsule Commonly known as: FLOMAX  Take 1 capsule (0.4 mg total) by mouth daily.        Allergies: Allergies[1]  Family History: Family History  Problem Relation Age of Onset   Diabetes Father    Kidney nephrosis Father    Seizures Mother    Cancer Maternal Grandfather 72       pancreatic and colon 37s   Cancer Paternal Grandmother        ovarian   Kidney cancer Neg Hx    Prostate cancer Neg Hx    Bladder Cancer Neg Hx     Social History:  reports that she has been smoking cigarettes. She has never used smokeless tobacco. She reports current alcohol use. She reports current drug use. Drug: Marijuana.  ROS: Pertinent ROS in HPI  Physical Exam: BP 101/63   Pulse 61   Temp 97.7 F (36.5 C)   Wt 125 lb (56.7 kg)   SpO2 95%   BMI 20.80 kg/m   Constitutional:  Well nourished. Alert and oriented, appears very uncomfortable. HEENT:  AT, moist mucus membranes.  Trachea midline Cardiovascular: No clubbing, cyanosis, or edema. Respiratory: Normal respiratory effort, no increased work of breathing. Neurologic: Grossly intact, no focal deficits, moving all 4 extremities. Psychiatric: Normal mood and affect.    Laboratory Data: See Epic and HPI   I have reviewed the labs.   Pertinent Imaging: CLINICAL DATA:  Right lower quadrant pain burning with urination   EXAM: CT ABDOMEN AND PELVIS WITHOUT CONTRAST   TECHNIQUE: Multidetector  CT imaging of the abdomen and pelvis was performed following the standard protocol without IV contrast.   RADIATION DOSE REDUCTION: This exam was performed according to the departmental dose-optimization program which includes automated exposure control, adjustment of the mA and/or kV according to patient size and/or use of iterative reconstruction technique.   COMPARISON:  CT 09/07/2022   FINDINGS: Lower chest: Lung bases demonstrate no acute airspace disease.   Hepatobiliary: No focal liver abnormality is seen. No gallstones, gallbladder wall thickening, or biliary dilatation.   Pancreas: Unremarkable. No pancreatic ductal dilatation or surrounding inflammatory changes.   Spleen: Normal in size without focal abnormality.   Adrenals/Urinary Tract: Adrenal glands are  normal. Small nonobstructing left kidney stones. Minimal right hydronephrosis and prominence of right ureter, secondary to a 4 mm stone at the right UVJ. Mild right perinephric stranding   Stomach/Bowel: Stomach is within normal limits. Appendix appears normal. No evidence of bowel wall thickening, distention, or inflammatory changes.   Vascular/Lymphatic: No significant vascular findings are present. No enlarged abdominal or pelvic lymph nodes.   Reproductive: Uterus unremarkable.  2.8 cm left adnexal cyst.   Other: No ascites or free air   Musculoskeletal: No acute or suspicious osseous abnormality   IMPRESSION: 1. Minimal right hydronephrosis and prominence of right ureter, secondary to a 4 mm stone at the right UVJ. 2. Small nonobstructing left kidney stone. 3. 2.8 cm left adnexal cyst. No follow-up imaging recommended.     Electronically Signed   By: Luke Bun M.D.   On: 08/25/2024 17:04 SD < 1500 HU SSD < 11 cm  KUB, radiologist interpretation still pending  I have independently reviewed the films.  See HPI.   Assessment & Plan:    1. Right UVJ stone - We discussed various treatment  options for urolithiasis including observation with or without medical expulsive therapy, shockwave lithotripsy (SWL), ureteroscopy and laser lithotripsy with stent placement and given stone size, stone location and stone density, explained that ESWL has a lower stone-free rate in a single procedure, but also a lower complication rate compared to ureteroscopy, and avoids a stent and associated stent related symptoms. Possible complications include renal hematoma, steinstrasse, and the need for additional treatment, explained that ureteroscopy with laser lithotripsy and stent placement has a higher stone-free rate than SWL in a single procedure; however, there is an increased complication rate, including possible infection, ureteral injury, bleeding, and stent-related morbidity. Common stent-related symptoms include dysuria, urgency/frequency, and flank pain. -She does not want to uteroscopy as she states she poorly tolerates the stent -She wants to pursue ESWL - Explained to the patient that with ESWL, shock waves are focused on the stone using X-rays to pinpoint the stone.  The shock waves are fired repeatedly which usually causes the stone to break into small pieces which pass out in the urine over the next few weeks.  They will go home that day and may be able to resume normal activities in three days.  They should be given a strainer and they need to collect any fragments/sediments that they pass for analysis.   Risks involved with the procedure consist of bruising of the skin and kidney, possible long-term kidney damage, development of HTN, damage to the bowel, spleen, liver, pancreas, female organs or lungs, hematuria, hematuria serious enough to require transfusion or surgical repair, formation of a hematoma, infection or sepsis.  If the stone is too large or dense, it may not break apart or break apart in large pieces and cause Steinstrasse.  If this happens, it would result in the need for another  procedure (likely URS/LL/stent placement).  IV sedation is typically used, but in rare instances general anesthesia is used with the risk of irregular heart beat, irregular BP, stroke, MI, CVA, paralysis, coma and/or death.  - UA w/ micro heme, not concerning for infection - urine culture pending       Return for right ESWL .  These notes generated with voice recognition software. I apologize for typographical errors.  Jennifer CORNWALL, PA-C  [facility] [address]  [address] [place], [place] [address] [phone]    ESWL ORDER FORM  Expected  date of procedure: 08/31/2024   Surgeon: Redell Burnet, MD  Post op standing: 2-4wk follow up w/KUB prior  Anticoagulation/Aspirin/NSAID standing order: Hold all 72 hours prior  Anesthesia standing order: MAC  VTE standing: SCD's  Dx: Right Ureteral Stone  Procedure: right Extracorporeal shock wave lithotripsy  CPT : 50590  Standing Order Set:   *NPO after mn, KUB  *NS 154ml/hr, Keflex  500mg  PO, Benadryl  25mg  PO, Valium  10mg  PO, Zofran  4mg  IV  Medications if other than standing orders:   NONE      [1]  Allergies Allergen Reactions   Clindamycin/Lincomycin     Possibly CLINDAMYCIN vaginal gel. Patient uncertain of drug   Shrimp [Shellfish Allergy]    "

## 2024-08-29 ENCOUNTER — Ambulatory Visit
Admission: RE | Admit: 2024-08-29 | Discharge: 2024-08-29 | Disposition: A | Source: Ambulatory Visit | Attending: Urology | Admitting: Urology

## 2024-08-29 ENCOUNTER — Encounter: Payer: Self-pay | Admitting: Urology

## 2024-08-29 ENCOUNTER — Other Ambulatory Visit: Payer: Self-pay

## 2024-08-29 ENCOUNTER — Ambulatory Visit: Admitting: Urology

## 2024-08-29 ENCOUNTER — Ambulatory Visit
Admission: RE | Admit: 2024-08-29 | Discharge: 2024-08-29 | Disposition: A | Discharge status: Home / Self Care | Source: Ambulatory Visit | Attending: Urology | Admitting: Urology

## 2024-08-29 VITALS — BP 101/63 | HR 61 | Temp 97.7°F | Wt 125.0 lb

## 2024-08-29 DIAGNOSIS — N200 Calculus of kidney: Secondary | ICD-10-CM

## 2024-08-29 DIAGNOSIS — N201 Calculus of ureter: Secondary | ICD-10-CM

## 2024-08-29 DIAGNOSIS — R112 Nausea with vomiting, unspecified: Secondary | ICD-10-CM

## 2024-08-29 LAB — URINALYSIS, COMPLETE
Bilirubin, UA: NEGATIVE
Glucose, UA: NEGATIVE
Ketones, UA: NEGATIVE
Leukocytes,UA: NEGATIVE
Nitrite, UA: NEGATIVE
Protein,UA: NEGATIVE
Specific Gravity, UA: 1.03 (ref 1.005–1.030)
Urobilinogen, Ur: 0.2 mg/dL (ref 0.2–1.0)
pH, UA: 5.5 (ref 5.0–7.5)

## 2024-08-29 LAB — MICROSCOPIC EXAMINATION
Epithelial Cells (non renal): >10 /HPF — AB (ref 0–10)
RBC, Urine: >30 /HPF — AB (ref 0–2)

## 2024-08-29 MED ORDER — ONDANSETRON 4 MG PO TBDP: 4.0000 mg | ORAL_TABLET | Freq: Three times a day (TID) | ORAL | 0 refills | Status: AC | PRN

## 2024-08-29 MED ORDER — PROMETHAZINE HCL 50 MG/ML IJ SOLN
25.0000 mg | Freq: Once | INTRAMUSCULAR | Status: AC
  Administered 2024-08-29: 25 mg via INTRAMUSCULAR

## 2024-08-29 MED ORDER — OXYCODONE-ACETAMINOPHEN 5-325 MG PO TABS: 1.0000 | ORAL_TABLET | Freq: Four times a day (QID) | ORAL | 0 refills | Status: AC | PRN

## 2024-08-29 NOTE — Progress Notes (Signed)
"  ° ° °  [facility] ESWL POSTING SHEET        Patient Name: Jennifer Richmond  DOB: 1986/06/13  MRN: [id number]  Surgeon:  Redell Burnet, MD  Diagnosis:  Right Ureteral Stone  CPT: 49409  ESWL DATE: 08/31/2024  ESWL TIME: 0845  Special Needs/Requirements: None       Cardiac/Medical/Pulmonary Clearance needed: no       Form Faxed to [facility]- [phone] Date:   Date: 08/29/2024       Form Faxed to [facility]- [phone]  Date:  Date: 08/29/2024           Copy Made for Insurance PA:  Date: 08/29/2024       Orders Entered in to Epic:  Date: 08/29/2024                   "

## 2024-08-29 NOTE — Progress Notes (Signed)
 ESWL ORDER FORM  Expected date of procedure: 08/31/2024   Surgeon: Redell Burnet, MD  Post op standing: 2-4wk follow up w/KUB prior  Anticoagulation/Aspirin/NSAID standing order: Hold all 72 hours prior  Anesthesia standing order: MAC  VTE standing: SCD's  Dx: Right Ureteral Stone  Procedure: right Extracorporeal shock wave lithotripsy  CPT : 50590  Standing Order Set:   *NPO after mn, KUB  *NS 182ml/hr, Keflex  500mg  PO, Benadryl  25mg  PO, Valium  10mg  PO, Zofran  4mg  IV  Medications if other than standing orders:   NONE

## 2024-08-31 ENCOUNTER — Ambulatory Visit

## 2024-08-31 ENCOUNTER — Other Ambulatory Visit: Payer: Self-pay

## 2024-08-31 ENCOUNTER — Encounter: Payer: Self-pay | Admitting: Urology

## 2024-08-31 ENCOUNTER — Ambulatory Visit
Admission: RE | Admit: 2024-08-31 | Discharge: 2024-08-31 | Disposition: A | Discharge status: Home / Self Care | Attending: Urology | Admitting: Urology

## 2024-08-31 ENCOUNTER — Encounter
Admission: RE | Disposition: A | Discharge status: Home / Self Care | Payer: Self-pay | Source: Home / Self Care | Attending: Urology

## 2024-08-31 DIAGNOSIS — N200 Calculus of kidney: Secondary | ICD-10-CM

## 2024-08-31 DIAGNOSIS — N201 Calculus of ureter: Secondary | ICD-10-CM | POA: Diagnosis present

## 2024-08-31 DIAGNOSIS — Z539 Procedure and treatment not carried out, unspecified reason: Secondary | ICD-10-CM | POA: Diagnosis not present

## 2024-08-31 HISTORY — PX: EXTRACORPOREAL SHOCK WAVE LITHOTRIPSY: SHX1557

## 2024-08-31 LAB — POCT PREGNANCY, URINE: Preg Test, Ur: NEGATIVE

## 2024-08-31 SURGERY — LITHOTRIPSY, ESWL
Anesthesia: General | Laterality: Right

## 2024-08-31 MED ORDER — DIAZEPAM 5 MG PO TABS
10.0000 mg | ORAL_TABLET | ORAL | Status: AC
  Administered 2024-08-31: 10 mg via ORAL

## 2024-08-31 MED ORDER — DIPHENHYDRAMINE HCL 25 MG PO CAPS
ORAL_CAPSULE | ORAL | Status: AC
  Filled 2024-08-31: qty 1

## 2024-08-31 MED ORDER — DIAZEPAM 5 MG PO TABS
ORAL_TABLET | ORAL | Status: AC
  Filled 2024-08-31: qty 2

## 2024-08-31 MED ORDER — CEPHALEXIN 500 MG PO CAPS
ORAL_CAPSULE | ORAL | Status: AC
  Filled 2024-08-31: qty 1

## 2024-08-31 MED ORDER — CEPHALEXIN 500 MG PO CAPS
500.0000 mg | ORAL_CAPSULE | Freq: Once | ORAL | Status: AC
  Administered 2024-08-31: 500 mg via ORAL

## 2024-08-31 MED ORDER — DIPHENHYDRAMINE HCL 25 MG PO CAPS
25.0000 mg | ORAL_CAPSULE | ORAL | Status: AC
  Administered 2024-08-31: 25 mg via ORAL

## 2024-08-31 MED ORDER — SODIUM CHLORIDE 0.9 % IV SOLN: INTRAVENOUS | Status: DC

## 2024-08-31 MED ORDER — ONDANSETRON HCL 4 MG/2ML IJ SOLN
INTRAMUSCULAR | Status: AC
  Filled 2024-08-31: qty 2

## 2024-08-31 MED ORDER — ONDANSETRON HCL 4 MG/2ML IJ SOLN
4.0000 mg | Freq: Once | INTRAMUSCULAR | Status: AC
  Administered 2024-08-31: 4 mg via INTRAVENOUS

## 2024-08-31 NOTE — Progress Notes (Signed)
 Scheduled today for right shockwave lithotripsy for a 4 mm distal ureteral stone  Her symptoms have resolved over the last 24 hours, asymptomatic today.  On my review of the KUB today no visible stone and highly likely stone passed spontaneously  Procedure canceled, recommended 50-month follow-up with 24-hour urine metabolic workup  Redell Burnet, MD 08/31/2024

## 2024-09-01 ENCOUNTER — Encounter: Payer: Self-pay | Admitting: Urology

## 2024-09-01 LAB — CULTURE, URINE COMPREHENSIVE

## 2024-09-05 ENCOUNTER — Encounter: Payer: Self-pay | Admitting: Urology

## 2024-12-06 ENCOUNTER — Ambulatory Visit: Admitting: Urology
